# Patient Record
Sex: Female | Born: 1940
Health system: Southern US, Community
[De-identification: ages and names within clinical notes are randomized; demographics above are authoritative.]

## PROBLEM LIST (undated history)

## (undated) DIAGNOSIS — F819 Developmental disorder of scholastic skills, unspecified: Secondary | ICD-10-CM

## (undated) DIAGNOSIS — F801 Expressive language disorder: Secondary | ICD-10-CM

## (undated) DIAGNOSIS — E669 Obesity, unspecified: Secondary | ICD-10-CM

## (undated) DIAGNOSIS — H3582 Retinal ischemia: Secondary | ICD-10-CM

## (undated) DIAGNOSIS — T7840XA Allergy, unspecified, initial encounter: Secondary | ICD-10-CM

## (undated) DIAGNOSIS — F419 Anxiety disorder, unspecified: Secondary | ICD-10-CM

## (undated) DIAGNOSIS — I10 Essential (primary) hypertension: Secondary | ICD-10-CM

## (undated) DIAGNOSIS — D649 Anemia, unspecified: Secondary | ICD-10-CM

## (undated) DIAGNOSIS — E785 Hyperlipidemia, unspecified: Secondary | ICD-10-CM

## (undated) DIAGNOSIS — F79 Unspecified intellectual disabilities: Secondary | ICD-10-CM

## (undated) DIAGNOSIS — K219 Gastro-esophageal reflux disease without esophagitis: Secondary | ICD-10-CM

## (undated) HISTORY — DX: Retinal ischemia: H35.82

## (undated) HISTORY — DX: Allergy, unspecified, initial encounter: T78.40XA

## (undated) HISTORY — PX: CHOLECYSTECTOMY: SHX55

## (undated) HISTORY — DX: Developmental disorder of scholastic skills, unspecified: F81.9

## (undated) HISTORY — DX: Anemia, unspecified: D64.9

## (undated) HISTORY — DX: Essential (primary) hypertension: I10

## (undated) HISTORY — DX: Anxiety disorder, unspecified: F41.9

## (undated) HISTORY — DX: Gastro-esophageal reflux disease without esophagitis: K21.9

## (undated) HISTORY — DX: Unspecified intellectual disabilities: F79

## (undated) HISTORY — DX: Expressive language disorder: F80.1

## (undated) HISTORY — DX: Obesity, unspecified: E66.9

## (undated) HISTORY — DX: Hyperlipidemia, unspecified: E78.5

---

## 2000-09-04 ENCOUNTER — Encounter: Payer: Self-pay | Admitting: Family Medicine

## 2000-09-04 LAB — CONVERTED CEMR LAB

## 2002-06-04 ENCOUNTER — Ambulatory Visit (HOSPITAL_COMMUNITY): Admission: RE | Admit: 2002-06-04 | Discharge: 2002-06-04 | Payer: Self-pay

## 2002-06-04 ENCOUNTER — Encounter (INDEPENDENT_AMBULATORY_CARE_PROVIDER_SITE_OTHER): Payer: Self-pay | Admitting: *Deleted

## 2005-04-26 ENCOUNTER — Ambulatory Visit: Payer: Self-pay | Admitting: Family Medicine

## 2005-05-27 ENCOUNTER — Ambulatory Visit: Payer: Self-pay | Admitting: Family Medicine

## 2005-05-31 ENCOUNTER — Ambulatory Visit: Payer: Self-pay | Admitting: Family Medicine

## 2005-11-30 ENCOUNTER — Ambulatory Visit: Payer: Self-pay | Admitting: Family Medicine

## 2006-02-14 ENCOUNTER — Ambulatory Visit: Payer: Self-pay | Admitting: Family Medicine

## 2006-06-09 ENCOUNTER — Ambulatory Visit: Payer: Self-pay | Admitting: Family Medicine

## 2006-06-13 ENCOUNTER — Encounter: Payer: Self-pay | Admitting: Family Medicine

## 2006-06-13 LAB — CONVERTED CEMR LAB: Microalbumin U total vol: 13.3 mg/L

## 2006-09-14 ENCOUNTER — Ambulatory Visit: Payer: Self-pay | Admitting: Family Medicine

## 2006-12-15 ENCOUNTER — Ambulatory Visit: Payer: Self-pay | Admitting: Family Medicine

## 2006-12-15 LAB — CONVERTED CEMR LAB
ALT: 32 units/L (ref 0–40)
AST: 36 units/L (ref 0–37)
Albumin: 4.5 g/dL (ref 3.5–5.2)
BUN: 16 mg/dL (ref 6–23)
CO2: 28 meq/L (ref 19–32)
Calcium: 9.8 mg/dL (ref 8.4–10.5)
Chloride: 102 meq/L (ref 96–112)
Chol/HDL Ratio, serum: 5.7
Cholesterol: 220 mg/dL (ref 0–200)
Creatinine, Ser: 0.9 mg/dL (ref 0.4–1.2)
GFR calc non Af Amer: 67 mL/min
Glomerular Filtration Rate, Af Am: 81 mL/min/{1.73_m2}
Glucose, Bld: 177 mg/dL — ABNORMAL HIGH (ref 70–99)
HDL: 38.7 mg/dL — ABNORMAL LOW (ref >39.0)
Hgb A1c MFr Bld: 6.5 %
Hgb A1c MFr Bld: 6.5 % — ABNORMAL HIGH (ref 4.6–6.0)
LDL DIRECT: 112.9 mg/dL
Phosphorus Concentration: 3.2 mg/dL (ref 2.3–4.6)
Potassium: 4.1 meq/L (ref 3.5–5.1)
Sodium: 139 meq/L (ref 135–145)
Triglyceride fasting, serum: 298 mg/dL (ref 0–149)
VLDL: 60 mg/dL — ABNORMAL HIGH (ref 0–40)

## 2007-06-05 ENCOUNTER — Encounter: Payer: Self-pay | Admitting: Family Medicine

## 2007-06-05 DIAGNOSIS — E1169 Type 2 diabetes mellitus with other specified complication: Secondary | ICD-10-CM | POA: Insufficient documentation

## 2007-06-05 DIAGNOSIS — J309 Allergic rhinitis, unspecified: Secondary | ICD-10-CM | POA: Insufficient documentation

## 2007-06-05 DIAGNOSIS — H3582 Retinal ischemia: Secondary | ICD-10-CM | POA: Insufficient documentation

## 2007-06-05 DIAGNOSIS — E119 Type 2 diabetes mellitus without complications: Secondary | ICD-10-CM | POA: Insufficient documentation

## 2007-06-05 DIAGNOSIS — F801 Expressive language disorder: Secondary | ICD-10-CM | POA: Insufficient documentation

## 2007-06-05 DIAGNOSIS — F411 Generalized anxiety disorder: Secondary | ICD-10-CM | POA: Insufficient documentation

## 2007-06-05 DIAGNOSIS — I152 Hypertension secondary to endocrine disorders: Secondary | ICD-10-CM | POA: Insufficient documentation

## 2007-06-05 DIAGNOSIS — I10 Essential (primary) hypertension: Secondary | ICD-10-CM

## 2007-06-05 DIAGNOSIS — T783XXA Angioneurotic edema, initial encounter: Secondary | ICD-10-CM | POA: Insufficient documentation

## 2007-06-05 DIAGNOSIS — E7800 Pure hypercholesterolemia, unspecified: Secondary | ICD-10-CM

## 2007-06-15 ENCOUNTER — Ambulatory Visit: Payer: Self-pay | Admitting: Family Medicine

## 2007-06-19 LAB — CONVERTED CEMR LAB
ALT: 21 units/L (ref 0–35)
AST: 25 units/L (ref 0–37)
Albumin: 4.8 g/dL (ref 3.5–5.2)
BUN: 11 mg/dL (ref 6–23)
CO2: 25 meq/L (ref 19–32)
Calcium: 9.9 mg/dL (ref 8.4–10.5)
Chloride: 97 meq/L (ref 96–112)
Cholesterol: 248 mg/dL (ref 0–200)
Creatinine, Ser: 0.9 mg/dL (ref 0.4–1.2)
Creatinine,U: 290.5 mg/dL
Direct LDL: 108.1 mg/dL
GFR calc Af Amer: 81 mL/min
GFR calc non Af Amer: 67 mL/min
Glucose, Bld: 177 mg/dL — ABNORMAL HIGH (ref 70–99)
HDL: 39.8 mg/dL (ref >39.0)
Hgb A1c MFr Bld: 6.3 % — ABNORMAL HIGH (ref 4.6–6.0)
Microalb Creat Ratio: 44.1 mg/g — ABNORMAL HIGH (ref 0.0–30.0)
Microalb, Ur: 12.8 mg/dL — ABNORMAL HIGH (ref 0.0–1.9)
Phosphorus: 3.3 mg/dL (ref 2.3–4.6)
Potassium: 3.9 meq/L (ref 3.5–5.1)
Sodium: 137 meq/L (ref 135–145)
Total CHOL/HDL Ratio: 6.2
Triglycerides: 407 mg/dL (ref 0–149)
VLDL: 81 mg/dL — ABNORMAL HIGH (ref 0–40)

## 2007-09-18 ENCOUNTER — Encounter (INDEPENDENT_AMBULATORY_CARE_PROVIDER_SITE_OTHER): Payer: Self-pay | Admitting: *Deleted

## 2007-09-28 ENCOUNTER — Ambulatory Visit: Payer: Self-pay | Admitting: Family Medicine

## 2007-10-01 LAB — CONVERTED CEMR LAB
Creatinine, Urine: 263.9 mg/dL
Microalb Creat Ratio: 30.4 mg/g — ABNORMAL HIGH (ref 0.0–30.0)
Microalb, Ur: 8.02 mg/dL — ABNORMAL HIGH (ref 0.00–1.89)

## 2007-11-22 ENCOUNTER — Ambulatory Visit: Payer: Self-pay | Admitting: Family Medicine

## 2008-01-22 ENCOUNTER — Encounter: Payer: Self-pay | Admitting: Family Medicine

## 2008-01-23 ENCOUNTER — Encounter: Payer: Self-pay | Admitting: Family Medicine

## 2008-02-07 ENCOUNTER — Ambulatory Visit: Payer: Self-pay | Admitting: Family Medicine

## 2008-02-13 LAB — CONVERTED CEMR LAB
ALT: 22 units/L (ref 0–35)
AST: 23 units/L (ref 0–37)
Albumin: 4.9 g/dL (ref 3.5–5.2)
Alkaline Phosphatase: 43 units/L (ref 39–117)
BUN: 14 mg/dL (ref 6–23)
Bilirubin, Direct: 0.1 mg/dL (ref 0.0–0.3)
CO2: 27 meq/L (ref 19–32)
Calcium: 10 mg/dL (ref 8.4–10.5)
Chloride: 97 meq/L (ref 96–112)
Cholesterol: 283 mg/dL (ref 0–200)
Creatinine, Ser: 0.9 mg/dL (ref 0.4–1.2)
Creatinine,U: 336.1 mg/dL
Direct LDL: 134.8 mg/dL
GFR calc Af Amer: 81 mL/min
GFR calc non Af Amer: 67 mL/min
Glucose, Bld: 159 mg/dL — ABNORMAL HIGH (ref 70–99)
HDL: 38.4 mg/dL — ABNORMAL LOW (ref >39.0)
Hgb A1c MFr Bld: 6.2 % — ABNORMAL HIGH (ref 4.6–6.0)
Microalb Creat Ratio: 37.2 mg/g — ABNORMAL HIGH (ref 0.0–30.0)
Microalb, Ur: 12.5 mg/dL — ABNORMAL HIGH (ref 0.0–1.9)
Phosphorus: 3.6 mg/dL (ref 2.3–4.6)
Potassium: 4.3 meq/L (ref 3.5–5.1)
Sodium: 135 meq/L (ref 135–145)
Total Bilirubin: 0.8 mg/dL (ref 0.3–1.2)
Total CHOL/HDL Ratio: 7.4
Total Protein: 8.3 g/dL (ref 6.0–8.3)
Triglycerides: 391 mg/dL (ref 0–149)
VLDL: 78 mg/dL — ABNORMAL HIGH (ref 0–40)

## 2008-07-09 ENCOUNTER — Ambulatory Visit: Payer: Self-pay | Admitting: Family Medicine

## 2008-07-10 LAB — CONVERTED CEMR LAB
ALT: 16 units/L (ref 0–35)
AST: 20 units/L (ref 0–37)
Albumin: 4.6 g/dL (ref 3.5–5.2)
Alkaline Phosphatase: 45 units/L (ref 39–117)
BUN: 11 mg/dL (ref 6–23)
Basophils Absolute: 0.1 10*3/uL (ref 0.0–0.1)
Basophils Relative: 0.9 % (ref 0.0–3.0)
Bilirubin, Direct: 0.1 mg/dL (ref 0.0–0.3)
CO2: 28 meq/L (ref 19–32)
Calcium: 9.8 mg/dL (ref 8.4–10.5)
Chloride: 98 meq/L (ref 96–112)
Cholesterol: 243 mg/dL (ref 0–200)
Creatinine, Ser: 0.8 mg/dL (ref 0.4–1.2)
Creatinine,U: 223.8 mg/dL
Direct LDL: 104.3 mg/dL
Eosinophils Absolute: 0.1 10*3/uL (ref 0.0–0.7)
Eosinophils Relative: 0.9 % (ref 0.0–5.0)
GFR calc Af Amer: 92 mL/min
GFR calc non Af Amer: 76 mL/min
Glucose, Bld: 160 mg/dL — ABNORMAL HIGH (ref 70–99)
HCT: 38.2 % (ref 36.0–46.0)
HDL: 43.4 mg/dL (ref >39.0)
Hemoglobin: 13.2 g/dL (ref 12.0–15.0)
Hgb A1c MFr Bld: 6.3 % — ABNORMAL HIGH (ref 4.6–6.0)
Lymphocytes Relative: 26.1 % (ref 12.0–46.0)
MCHC: 34.7 g/dL (ref 30.0–36.0)
MCV: 92.4 fL (ref 78.0–100.0)
Microalb Creat Ratio: 292.7 mg/g — ABNORMAL HIGH (ref 0.0–30.0)
Microalb, Ur: 65.5 mg/dL — ABNORMAL HIGH (ref 0.0–1.9)
Monocytes Absolute: 0.6 10*3/uL (ref 0.1–1.0)
Monocytes Relative: 10.6 % (ref 3.0–12.0)
Neutro Abs: 3.5 10*3/uL (ref 1.4–7.7)
Neutrophils Relative %: 61.5 % (ref 43.0–77.0)
Phosphorus: 4.4 mg/dL (ref 2.3–4.6)
Platelets: 256 10*3/uL (ref 150–400)
Potassium: 4 meq/L (ref 3.5–5.1)
RBC: 4.13 M/uL (ref 3.87–5.11)
RDW: 13.7 % (ref 11.5–14.6)
Sodium: 137 meq/L (ref 135–145)
Total Bilirubin: 0.8 mg/dL (ref 0.3–1.2)
Total CHOL/HDL Ratio: 5.6
Total Protein: 8 g/dL (ref 6.0–8.3)
Triglycerides: 388 mg/dL (ref 0–149)
VLDL: 78 mg/dL — ABNORMAL HIGH (ref 0–40)
WBC: 5.8 10*3/uL (ref 4.5–10.5)

## 2008-10-09 ENCOUNTER — Ambulatory Visit: Payer: Self-pay | Admitting: Family Medicine

## 2008-11-03 ENCOUNTER — Telehealth: Payer: Self-pay | Admitting: Family Medicine

## 2008-12-10 ENCOUNTER — Ambulatory Visit: Payer: Self-pay | Admitting: Family Medicine

## 2008-12-11 LAB — CONVERTED CEMR LAB
CO2: 27 meq/L (ref 19–32)
Calcium: 9.7 mg/dL (ref 8.4–10.5)
Chloride: 99 meq/L (ref 96–112)
Creatinine,U: 124.8 mg/dL
GFR calc non Af Amer: 76 mL/min
Glucose, Bld: 180 mg/dL — ABNORMAL HIGH (ref 70–99)
Hgb A1c MFr Bld: 6.4 % — ABNORMAL HIGH (ref 4.6–6.0)
Microalb Creat Ratio: 56.1 mg/g — ABNORMAL HIGH (ref 0.0–30.0)
Microalb, Ur: 7 mg/dL — ABNORMAL HIGH (ref 0.0–1.9)
Potassium: 3.7 meq/L (ref 3.5–5.1)
Sodium: 137 meq/L (ref 135–145)

## 2009-05-06 ENCOUNTER — Ambulatory Visit: Payer: Self-pay | Admitting: Family Medicine

## 2009-06-10 ENCOUNTER — Ambulatory Visit: Payer: Self-pay | Admitting: Family Medicine

## 2009-06-18 ENCOUNTER — Encounter (INDEPENDENT_AMBULATORY_CARE_PROVIDER_SITE_OTHER): Payer: Self-pay | Admitting: *Deleted

## 2009-06-18 LAB — CONVERTED CEMR LAB
BUN: 16 mg/dL (ref 6–23)
CO2: 28 meq/L (ref 19–32)
Creatinine, Ser: 0.8 mg/dL (ref 0.4–1.2)
Hgb A1c MFr Bld: 6.6 % — ABNORMAL HIGH (ref 4.6–6.5)

## 2009-09-08 ENCOUNTER — Ambulatory Visit: Payer: Self-pay | Admitting: Family Medicine

## 2009-09-15 LAB — CONVERTED CEMR LAB
ALT: 20 units/L (ref 0–35)
CO2: 28 meq/L (ref 19–32)
Creatinine, Ser: 0.7 mg/dL (ref 0.4–1.2)
GFR calc non Af Amer: 88.56 mL/min (ref >60)
Glucose, Bld: 188 mg/dL — ABNORMAL HIGH (ref 70–99)
Sodium: 137 meq/L (ref 135–145)
Total CHOL/HDL Ratio: 6
Triglycerides: 375 mg/dL — ABNORMAL HIGH (ref 0.0–149.0)

## 2009-11-18 ENCOUNTER — Ambulatory Visit: Payer: Self-pay | Admitting: Family Medicine

## 2009-11-18 LAB — CONVERTED CEMR LAB
ALT: 16 units/L (ref 0–35)
Cholesterol: 286 mg/dL — ABNORMAL HIGH (ref 0–200)
Glucose, Bld: 159 mg/dL — ABNORMAL HIGH (ref 70–99)
Hgb A1c MFr Bld: 6.4 % (ref 4.6–6.5)
Total CHOL/HDL Ratio: 7

## 2009-11-23 ENCOUNTER — Ambulatory Visit: Payer: Self-pay | Admitting: Family Medicine

## 2009-12-08 ENCOUNTER — Ambulatory Visit: Payer: Self-pay | Admitting: Family Medicine

## 2010-02-08 ENCOUNTER — Ambulatory Visit: Payer: Self-pay | Admitting: Family Medicine

## 2010-05-24 ENCOUNTER — Ambulatory Visit: Payer: Self-pay | Admitting: Family Medicine

## 2010-05-26 LAB — CONVERTED CEMR LAB
ALT: 15 units/L (ref 0–35)
Albumin: 4.8 g/dL (ref 3.5–5.2)
Calcium: 9.5 mg/dL (ref 8.4–10.5)
Chloride: 104 meq/L (ref 96–112)
HDL: 45.7 mg/dL (ref >39.00)
Potassium: 4.6 meq/L (ref 3.5–5.1)
Total CHOL/HDL Ratio: 6
Triglycerides: 436 mg/dL — ABNORMAL HIGH (ref 0.0–149.0)
VLDL: 87.2 mg/dL — ABNORMAL HIGH (ref 0.0–40.0)

## 2010-06-01 ENCOUNTER — Ambulatory Visit: Payer: Self-pay | Admitting: Family Medicine

## 2010-06-01 DIAGNOSIS — L538 Other specified erythematous conditions: Secondary | ICD-10-CM | POA: Insufficient documentation

## 2010-06-22 ENCOUNTER — Telehealth: Payer: Self-pay | Admitting: Family Medicine

## 2010-07-09 LAB — HM DIABETES EYE EXAM: HM Diabetic Eye Exam: NORMAL

## 2010-08-31 ENCOUNTER — Ambulatory Visit: Payer: Self-pay | Admitting: Family Medicine

## 2010-08-31 LAB — CONVERTED CEMR LAB
ALT: 16 units/L (ref 0–35)
AST: 20 units/L (ref 0–37)
Albumin: 4.9 g/dL (ref 3.5–5.2)
BUN: 26 mg/dL — ABNORMAL HIGH (ref 6–23)
Cholesterol: 272 mg/dL — ABNORMAL HIGH (ref 0–200)
GFR calc non Af Amer: 77.94 mL/min (ref >60)
Glucose, Bld: 165 mg/dL — ABNORMAL HIGH (ref 70–99)
Hgb A1c MFr Bld: 6.6 % — ABNORMAL HIGH (ref 4.6–6.5)
Phosphorus: 3.7 mg/dL (ref 2.3–4.6)
Potassium: 4.2 meq/L (ref 3.5–5.1)
Total CHOL/HDL Ratio: 6
Triglycerides: 366 mg/dL — ABNORMAL HIGH (ref 0.0–149.0)
VLDL: 73.2 mg/dL — ABNORMAL HIGH (ref 0.0–40.0)

## 2010-09-08 ENCOUNTER — Ambulatory Visit: Payer: Self-pay | Admitting: Family Medicine

## 2010-09-08 LAB — HM DIABETES FOOT EXAM

## 2011-01-04 NOTE — Progress Notes (Signed)
Summary: needs to change from generic allegra  Phone Note From Pharmacy   Caller: [facility]*   Summary of Call: Pt needs to change from generic allegra to something else that she can get with a prescription.  They are going to stop making the generic allegra, since allegra is now over the counter and pt cant afford the allegra.  Is there something else that she can get with a script that will help her allergies? Initial call taken by: Lowella Petties CMA,  June 22, 2010 12:08 PM  Follow-up for Phone Call        right now claritin and allegra and zyrtec are otc -- she is likely going to have to pay for one of these out of pocket  I can call in nasal spray flonase --? if this will completely handle symptoms or not but can try it  px written on EMR for call in  Follow-up by: Judith Part MD,  June 22, 2010 1:32 PM  Additional Follow-up for Phone Call Additional follow up Details #1::        Amy at [facility]  notified as instructed by telephone. [facility]. Lewanda Rife LPN  June 22, 2010 4:44 PM     New/Updated Medications: FLONASE 50 MCG/ACT SUSP (FLUTICASONE PROPIONATE) 2 sprays in each nostril once daily Prescriptions: FLONASE 50 MCG/ACT SUSP (FLUTICASONE PROPIONATE) 2 sprays in each nostril once daily  #1 mdi x 11   Entered and Authorized by:   Judith Part MD   Signed by:   Lewanda Rife LPN on 09/81/1914   Method used:   Telephoned to ...       [facility]* (retail)       [address]       [place], [place]  [address]       Ph: [phone]       Fax: [phone]   RxID:   [id number]

## 2011-01-04 NOTE — Assessment & Plan Note (Signed)
Summary: F/U AFTER LABS / LFW   Vital Signs:  Patient profile:   70 year old female Height:      64.5 inches Weight:      185 pounds BMI:     31.38 Temp:     97.7 degrees F oral Pulse rate:   64 / minute Pulse rhythm:   regular BP sitting:   116 / 70  (left arm) Cuff size:   large  Vitals Entered By: Lewanda Rife LPN (September 08, 2010 10:25 AM) CC: three month f/u after labs   History of Present Illness: here for f/u of HTN and DM and lipids    wt is down another 5 lb-- is proud of that  more exercise -- working in the garden and walking   AIc is up to 6.6 from 6.5-- overall stable with diet control very careful about the sugar in her diet  sugars are below 120 in the am for the most part  lipids are a bit better with LDL 120 (trig 366 are high but overall imp as well) pt cannot take statins due to lft inc and non tol to zetia diet -- less fats in her diet  she is doing her own cooking and that is better   bp is great 116.70  already given her flu shot  is up to date on vaccines   saw eye doctor - no retinop 2 mo ago -- opthy-- [facility]  no change in her vision   declines mam and colonoscopy   still sad and lonely at times after her husband left ( he is mentally ill)- but overall doing ok    Allergies: 1)  ! Lipitor 2)  ! * Zetia 3)  Sulfa  Past History:  Past Medical History: Last updated: 11/23/2009 Allergic rhinitis Anxiety Diabetes mellitus, type II Hypertension hyperlipidemia - (inc LFT with statin and non tol to zetia) expressive language disorder retinal ischemia-- hx of  obesity GERD mentally disabled/ learning disability (cannot take care of her finances)  opthy  Past Surgical History: Last updated: 06/05/2007 Cholecystectomy Carotid doppler- neg (04/2005) Abd [place]- neg (12/2005)  Family History: Last updated: June 23, 2009 Father: MI, HTN brother died MI at 20  Mother:  Siblings:   Social History: Last updated:  05/06/2009 Marital Status: Married husband has pschizoaffective disorder  family close by/ lot of support  works in garden Children: none Occupation:  non smoker- no second hand exp either  Risk Factors: Smoking Status: quit (06/05/2007)  Review of Systems General:  Denies fatigue, loss of appetite, and malaise. Eyes:  Denies blurring and eye irritation. CV:  Denies chest pain or discomfort, lightheadness, and palpitations. Resp:  Denies cough, shortness of breath, and wheezing. GI:  Denies abdominal pain, change in bowel habits, and indigestion. MS:  Denies joint pain, joint redness, joint swelling, muscle aches, and cramps. Derm:  Denies itching, lesion(s), poor wound healing, and rash. Neuro:  Denies numbness and tingling. Psych:  sad at times . Endo:  Denies cold intolerance, excessive thirst, excessive urination, and heat intolerance. Heme:  Denies abnormal bruising and bleeding.  Physical Exam  General:  overweight but generally well appearing  Head:  normocephalic, atraumatic, and no abnormalities observed.   Eyes:  vision grossly intact, pupils equal, pupils round, and pupils reactive to light.  no conjunctival pallor, injection or icterus  Ears:  R ear normal and L ear normal.   Nose:  no nasal discharge.   Mouth:  pharynx pink  and moist.   Neck:  supple with full rom and no masses or thyromegally, no JVD or carotid bruit  Chest Wall:  No deformities, masses, or tenderness noted. Lungs:  Normal respiratory effort, chest expands symmetrically. Lungs are clear to auscultation, no crackles or wheezes. Heart:  Normal rate and regular rhythm. S1 and S2 normal without gallop, murmur, click, rub or other extra sounds. Abdomen:  Bowel sounds positive,abdomen soft and non-tender without masses, organomegaly or hernias noted. no renal bruits  Msk:  No deformity or scoliosis noted of thoracic or lumbar spine.  no acute joint changes  Pulses:  R and L  carotid,radial,femoral,dorsalis pedis and posterior tibial pulses are full and equal bilaterally Extremities:  No clubbing, cyanosis, edema, or deformity noted with normal full range of motion of all joints.   Neurologic:  sensation intact to light touch, gait normal, and DTRs symmetrical and normal.   Skin:  Intact without suspicious lesions or rashes Cervical Nodes:  No lymphadenopathy noted Psych:  normal affect, talkative and pleasant   Diabetes Management Exam:    Foot Exam (with socks and/or shoes not present):       Sensory-Pinprick/Light touch:          Left medial foot (L-4): normal          Left dorsal foot (L-5): normal          Left lateral foot (S-1): normal          Right medial foot (L-4): normal          Right dorsal foot (L-5): normal          Right lateral foot (S-1): normal       Sensory-Monofilament:          Left foot: normal          Right foot: normal       Inspection:          Left foot: normal          Right foot: normal       Nails:          Left foot: normal          Right foot: normal    Eye Exam:       Eye Exam done elsewhere          Date: 07/09/2010          Results: normal          Done by: [facility]    Impression & Recommendations:  Problem # 1:  Hx of HYPERCHOLESTEROLEMIA (ICD-272.0) Assessment Improved  this is improved with better diet  non tol statin or zetia -- but watching diet for sat fats (which we disc) rev lab in detail  re check 6 mo and f/u  Labs Reviewed: SGOT: 20 (08/31/2010)   SGPT: 16 (08/31/2010)   HDL:47.90 (08/31/2010), 45.70 (05/24/2010)  LDL:DEL (07/09/2008), DEL (02/07/2008)  Chol:272 (08/31/2010), 270 (05/24/2010)  Trig:366.0 (08/31/2010), 436.0 (05/24/2010)  Problem # 2:  HYPERTENSION (ICD-401.9) Assessment: Unchanged  bp continues to be great with current meds lab reviewed f/u 6 mo  Her updated medication list for this problem includes:    Accupril 40 Mg Tabs (Quinapril hcl) .Marland Kitchen... Take 11/2 by mouth once  daily    Bisoprolol-hydrochlorothiazide 10-6.25 Mg Tabs (Bisoprolol-hydrochlorothiazide) .Marland Kitchen... Take two by mouth daily    Norvasc 10 Mg Tabs (Amlodipine besylate) .Marland Kitchen... Take one by mouth daily  BP today: 116/70 Prior BP: 126/74 (06/01/2010)  Labs Reviewed:  K+: 4.2 (08/31/2010) Creat: : 0.8 (08/31/2010)   Chol: 272 (08/31/2010)   HDL: 47.90 (08/31/2010)   LDL: DEL (07/09/2008)   TG: 366.0 (08/31/2010)  Problem # 3:  DIABETES MELLITUS, TYPE II (ICD-250.00) Assessment: Unchanged  this has very slt bumped  again disc imp of low glycemic diet and further wt loss and exercise she is compliant with sugar checks  opthy up to date and good foot care lab and f/u 6 mo  Her updated medication list for this problem includes:    Accupril 40 Mg Tabs (Quinapril hcl) .Marland Kitchen... Take 11/2 by mouth once daily  Labs Reviewed: Creat: 0.8 (08/31/2010)   Microalbumin: 13.3 (06/13/2006)  Last Eye Exam: normal (05/05/2009) Reviewed HgBA1c results: 6.6 (08/31/2010)  6.4 (05/24/2010)  Complete Medication List: 1)  Accupril 40 Mg Tabs (Quinapril hcl) .... Take 11/2 by mouth once daily 2)  Bisoprolol-hydrochlorothiazide 10-6.25 Mg Tabs (Bisoprolol-hydrochlorothiazide) .... Take two by mouth daily 3)  Norvasc 10 Mg Tabs (Amlodipine besylate) .... Take one by mouth daily 4)  Ascensia Breeze 2 Disk (Glucose blood) .... Check bs as directed 5)  Omeprazole 20 Mg Tbec (Omeprazole) .Marland Kitchen.. 1 by mouth q am 30 min before breakfast 6)  Zoloft 50 Mg Tabs (Sertraline hcl) .... Take 1 by mouth once daily in evening 7)  Lotrisone 1-0.05 % Crea (Clotrimazole-betamethasone) .... Apply to affected area once daily as needed 8)  Flonase 50 Mcg/act Susp (Fluticasone propionate) .... 2 sprays in each nostril once daily 9)  Tylenol Extra Strength 500 Mg Tabs (Acetaminophen) .... Otc as directed.  Other Orders: Flu Vaccine 59yrs + MEDICARE PATIENTS (X9147) Administration Flu vaccine - MCR (W2956)  Patient Instructions: 1)  labs are  stable 2)  keep working on healthy diet and exercise and weight loss  3)  no change in medicine 4)  schedule fasting labs in 6 months and then follow up  5)  lipid/ast /alt / renal / AIC / renal 250.0, 272   Current Allergies (reviewed today): ! LIPITOR ! * ZETIA SULFA    Flu Vaccine Consent Questions     Do you have a history of severe allergic reactions to this vaccine? no    Any prior history of allergic reactions to egg and/or gelatin? no    Do you have a sensitivity to the preservative Thimersol? no    Do you have a past history of Guillan-Barre Syndrome? no    Do you currently have an acute febrile illness? no    Have you ever had a severe reaction to latex? no    Vaccine information given and explained to patient? yes    Are you currently pregnant? no    Lot Number:[id number]   Exp Date:06/04/2011   Site Given  Left Deltoid IMedflu Lewanda Rife LPN  September 08, 2010 10:31 AM

## 2011-01-04 NOTE — Assessment & Plan Note (Signed)
Summary: 30 MIN F/U AFTER LABS / LFW R/S 05/27/10   Vital Signs:  Patient profile:   70 year old female Height:      64.5 inches Weight:      190.50 pounds BMI:     32.31 Temp:     97.7 degrees F oral Pulse rate:   60 / minute Pulse rhythm:   regular BP sitting:   126 / 74  (left arm) Cuff size:   large  Vitals Entered By: Lewanda Rife LPN (June 01, 2010 8:31 AM) CC: six month f/u after labs   History of Present Illness: here for f/u of HTN and DM and lipids  wt is up 3 lb  just went to the beach -- last week  not eating her usual diet -- some more sugar and fried foods  is walking regularly -- that is good -- getting strength back   HTN in good control 126/74  AIC up a bit at 6.4- but still good control opthy--was supposed to go last mo - but had to cancel -- re sched for july 22 no change in her vision  nl microalb  lipids -- trig continue to climb at 436 LDL 130s into statins and zetia with inc lfts   up to date on imms   ? yeast infx on breasts - needs a cream       Allergies: 1)  ! Lipitor 2)  ! * Zetia 3)  Sulfa  Past History:  Past Medical History: Last updated: 11/23/2009 Allergic rhinitis Anxiety Diabetes mellitus, type II Hypertension hyperlipidemia - (inc LFT with statin and non tol to zetia) expressive language disorder retinal ischemia-- hx of  obesity GERD mentally disabled/ learning disability (cannot take care of her finances)  opthy  Past Surgical History: Last updated: 06/05/2007 Cholecystectomy Carotid doppler- neg (04/2005) Abd [place]- neg (12/2005)  Family History: Last updated: 06/27/2009 Father: MI, HTN brother died MI at 51  Mother:  Siblings:   Social History: Last updated: 05/06/2009 Marital Status: Married husband has pschizoaffective disorder  family close by/ lot of support  works in garden Children: none Occupation:  non smoker- no second hand exp either  Risk Factors: Smoking Status: quit  (06/05/2007)  Review of Systems General:  Denies fatigue, loss of appetite, and malaise. Eyes:  Denies blurring and eye irritation. CV:  Denies chest pain or discomfort, near fainting, palpitations, shortness of breath with exertion, and swelling of feet. Resp:  Denies cough, shortness of breath, and wheezing. GI:  Denies abdominal pain and change in bowel habits. GU:  Denies dysuria. MS:  Denies joint redness, joint swelling, and muscle aches. Derm:  Denies itching, lesion(s), poor wound healing, and rash. Neuro:  Denies numbness and tingling. Psych:  Denies anxiety and depression. Endo:  Denies excessive thirst and excessive urination. Heme:  Denies abnormal bruising and bleeding.  Physical Exam  General:  overweight but generally well appearing  Head:  normocephalic, atraumatic, and no abnormalities observed.   Eyes:  vision grossly intact, pupils equal, pupils round, and pupils reactive to light.   Ears:  R ear normal and L ear normal.   Mouth:  pharynx pink and moist.   baseline speech impediment  Neck:  supple with full rom and no masses or thyromegally, no JVD or carotid bruit  Chest Wall:  No deformities, masses, or tenderness noted. Lungs:  Normal respiratory effort, chest expands symmetrically. Lungs are clear to auscultation, no crackles or wheezes. Heart:  Normal rate and  regular rhythm. S1 and S2 normal without gallop, murmur, click, rub or other extra sounds. Abdomen:  soft and non-tender.  no renal bruits  Msk:  No deformity or scoliosis noted of thoracic or lumbar spine.   Pulses:  R and L carotid,radial,femoral,dorsalis pedis and posterior tibial pulses are full and equal bilaterally Extremities:  No clubbing, cyanosis, edema, or deformity noted with normal full range of motion of all joints.   Neurologic:  sensation intact to light touch, gait normal, and DTRs symmetrical and normal.   Skin:  some erythema in patches under breasts resembling intertrigo  tanned    Cervical Nodes:  No lymphadenopathy noted Inguinal Nodes:  No significant adenopathy Psych:  nl affect- less depressed today  Diabetes Management Exam:    Foot Exam (with socks and/or shoes not present):       Sensory-Pinprick/Light touch:          Left medial foot (L-4): normal          Left dorsal foot (L-5): normal          Left lateral foot (S-1): normal          Right medial foot (L-4): normal          Right dorsal foot (L-5): normal          Right lateral foot (S-1): normal       Sensory-Monofilament:          Left foot: normal          Right foot: normal       Inspection:          Left foot: normal          Right foot: normal       Nails:          Left foot: thickened          Right foot: normal   Impression & Recommendations:  Problem # 1:  Hx of HYPERCHOLESTEROLEMIA (ICD-272.0) Assessment Deteriorated  lipids are up (trig ) today- could be from recent poor eating on vacation and inc sugar rev low fat diet in detail will work on that and wt loss in past inc lft with statins- ? if could consider trig agent if not imp in 3 mo   Labs Reviewed: SGOT: 18 (05/24/2010)   SGPT: 15 (05/24/2010)   HDL:45.70 (05/24/2010), 43.40 (11/18/2009)  LDL:DEL (07/09/2008), DEL (02/07/2008)  Chol:270 (05/24/2010), 286 (11/18/2009)  Trig:436.0 (05/24/2010), 350.0 (11/18/2009)  Orders: Prescription Created Electronically [phone])  Problem # 2:  Hx of ISCHEMIA, RETINAL (ICD-362.84) Assessment: Comment Only no vision change for f/u next mo   Problem # 3:  HYPERTENSION (ICD-401.9) Assessment: Unchanged  this is well controlled meds refilled enc walking and wt loss and low sodium diet  Her updated medication list for this problem includes:    Accupril 40 Mg Tabs (Quinapril hcl) .Marland Kitchen... Take 11/2 by mouth once daily    Bisoprolol-hydrochlorothiazide 10-6.25 Mg Tabs (Bisoprolol-hydrochlorothiazide) .Marland Kitchen... Take two by mouth daily    Norvasc 10 Mg Tabs (Amlodipine besylate) .Marland Kitchen... Take  one by mouth daily  BP today: 126/74 Prior BP: 128/80 (02/08/2010)  Labs Reviewed: K+: 4.6 (05/24/2010) Creat: : 0.8 (05/24/2010)   Chol: 270 (05/24/2010)   HDL: 45.70 (05/24/2010)   LDL: DEL (07/09/2008)   TG: 436.0 (05/24/2010)  Orders: Prescription Created Electronically [phone])  Problem # 4:  DIABETES MELLITUS, TYPE II (ICD-250.00) Assessment: Deteriorated  AIC up a bit but still well controlled opthy next mo  nl  microalb disc healthy diet (low simple sugar/ choose complex carbs/ low sat fat) diet and exercise in detail  lab and f/u 3 mo  Her updated medication list for this problem includes:    Accupril 40 Mg Tabs (Quinapril hcl) .Marland Kitchen... Take 11/2 by mouth once daily  Labs Reviewed: Creat: 0.8 (05/24/2010)   Microalbumin: 13.3 (06/13/2006)  Last Eye Exam: normal (05/05/2009) Reviewed HgBA1c results: 6.4 (05/24/2010)  6.1 (02/08/2010)  Orders: Prescription Created Electronically [phone])  Problem # 5:  INTERTRIGO, CANDIDAL (ICD-695.89) Assessment: Deteriorated  refil med for tinea -- use once daily as needed  disc imp of keeping area clean and dry as much as possible   Orders: Prescription Created Electronically [phone])  Complete Medication List: 1)  Accupril 40 Mg Tabs (Quinapril hcl) .... Take 11/2 by mouth once daily 2)  Bisoprolol-hydrochlorothiazide 10-6.25 Mg Tabs (Bisoprolol-hydrochlorothiazide) .... Take two by mouth daily 3)  Norvasc 10 Mg Tabs (Amlodipine besylate) .... Take one by mouth daily 4)  Allegra 180 Mg Tabs (Fexofenadine hcl) .... Take one by mouth daily for allergies 5)  Ascensia Breeze 2 Disk (Glucose blood) .... Check bs as directed 6)  Omeprazole 20 Mg Tbec (Omeprazole) .Marland Kitchen.. 1 by mouth q am 30 min before breakfast 7)  Zoloft 50 Mg Tabs (Sertraline hcl) .... Take 1 by mouth once daily in evening 8)  Lotrisone 1-0.05 % Crea (Clotrimazole-betamethasone) .... Apply to affected area once daily as needed  Patient Instructions: 1)  really stick  to a low sugar and low fat diet  2)  keep walking every day  3)  keep working on weight loss  4)  schedule fasting labs in 3 months lipid/ast/alt/AIC / renal 272, 250.0 and then follow up Prescriptions: ZOLOFT 50 MG TABS (SERTRALINE HCL) take 1 by mouth once daily in evening  #30 x 11   Entered and Authorized by:   Judith Part MD   Signed by:   Judith Part MD on 06/01/2010   Method used:   Electronically to        [facility]* (retail)       [address]       [place], [place]  [address]       Ph: [phone]       Fax: [phone]   RxID:   [id number] OMEPRAZOLE 20 MG  TBEC (OMEPRAZOLE) 1 by mouth q am 30 min before breakfast  #90 x 3   Entered and Authorized by:   Judith Part MD   Signed by:   Judith Part MD on 06/01/2010   Method used:   Electronically to        [facility]* (retail)       [address]       [place], [place]  [address]       Ph: [phone]       Fax: [phone]   RxID:   [id number] NORVASC 10 MG  TABS (AMLODIPINE BESYLATE) take one by mouth daily  #30 x 11   Entered and Authorized by:   Judith Part MD   Signed by:   Judith Part MD on 06/01/2010   Method used:   Electronically to        [facility]* (retail)       [address]       [place], [place]  [address]       Ph: [phone]       Fax: [phone]   RxID:   [id number] BISOPROLOL-HYDROCHLOROTHIAZIDE 10-6.25  MG  TABS (BISOPROLOL-HYDROCHLOROTHIAZIDE) take two by mouth daily  #60 x 11   Entered and Authorized by:   Judith Part MD   Signed by:   Judith Part MD on 06/01/2010   Method used:   Electronically to        [facility]* (retail)       [address]       [place], [place]  [address]       Ph: [phone]       Fax: [phone]   RxID:   [id number] LOTRISONE 1-0.05 % CREA (CLOTRIMAZOLE-BETAMETHASONE) apply to affected area once daily as needed  #1 small x 1   Entered and Authorized by:   Judith Part MD   Signed by:    Judith Part MD on 06/01/2010   Method used:   Electronically to        [facility]* (retail)       [address]       [place], [place]  [address]       Ph: [phone]       Fax: [phone]   RxID:   [id number]   Current Allergies (reviewed today): ! LIPITOR ! * ZETIA SULFA

## 2011-01-04 NOTE — Assessment & Plan Note (Signed)
Summary: PNEUMONIA VACCINE / LFW   Nurse Visit   Allergies: 1)  ! Lipitor 2)  ! * Zetia 3)  Sulfa  Immunizations Administered:  Pneumonia Vaccine:    Vaccine Type: Pneumovax    Site: right deltoid    Mfr: Merck    Dose: 0.5 ml    Route: IM    Given by: Lowella Petties CMA    Exp. Date: 12/31/2010    Lot #: [id number]    VIS given: 07/02/96 version given December 08, 2009.  Orders Added: 1)  Pneumococcal Vaccine [90732] 2)  Admin 1st Vaccine [04540]

## 2011-01-04 NOTE — Assessment & Plan Note (Signed)
Summary: RETURN OFFICE VISIT, [facility]   Vital Signs:  Patient profile:   70 year old female Height:      64.5 inches Weight:      187.50 pounds BMI:     31.80 Temp:     97.8 degrees F oral Pulse rate:   60 / minute Pulse rhythm:   regular BP sitting:   128 / 80  (left arm) Cuff size:   large  Vitals Entered By: Lewanda Rife LPN (February 08, 453 9:47 AM)  History of Present Illness: here for f/u of DM and HTN and lipids feeling about the same   HTN has been fairly controlled - no problems / headaches or further vision changes   lipids last LDL 140s- about the best she can get with diet is intolerant of zetia and all statins   DM- due for AIC , last 6.4 diet- doing good and really staying away from the sweets and drinks   exercise - goes some in the house when too cold outside   got her pneumovax is up to date opthy exam  tries to take care of feet with lotion  Allergies: 1)  ! Lipitor 2)  ! * Zetia 3)  Sulfa  Past History:  Past Medical History: Last updated: 11/23/2009 Allergic rhinitis Anxiety Diabetes mellitus, type II Hypertension hyperlipidemia - (inc LFT with statin and non tol to zetia) expressive language disorder retinal ischemia-- hx of  obesity GERD mentally disabled/ learning disability (cannot take care of her finances)  opthy  Past Surgical History: Last updated: 06/05/2007 Cholecystectomy Carotid doppler- neg (04/2005) Abd [place]- neg (12/2005)  Family History: Last updated: 06-18-2009 Father: MI, HTN brother died MI at 58  Mother:  Siblings:   Social History: Last updated: 05/06/2009 Marital Status: Married husband has pschizoaffective disorder  family close by/ lot of support  works in garden Children: none Occupation:  non smoker- no second hand exp either  Risk Factors: Smoking Status: quit (06/05/2007)  Review of Systems General:  Denies fatigue, fever, loss of appetite, and malaise. Eyes:  Denies blurring and eye  pain. ENT:  Complains of postnasal drainage; may be getting another cold- just started with post nasal drip and scratchy throat . CV:  Denies chest pain or discomfort, lightheadness, palpitations, and shortness of breath with exertion. Resp:  Denies cough, shortness of breath, and wheezing. GI:  Denies abdominal pain, change in bowel habits, indigestion, and nausea. GU:  Denies urinary frequency. MS:  Denies muscle aches. Derm:  Denies itching, lesion(s), poor wound healing, and rash. Neuro:  Denies numbness and tingling. Psych:  mood is a little better - is living alone and adjusting to idea of divorce. Endo:  Denies cold intolerance, excessive thirst, excessive urination, and heat intolerance. Heme:  Denies abnormal bruising and bleeding.  Physical Exam  General:  overweight but generally well appearing  Head:  normocephalic, atraumatic, and no abnormalities observed.   Eyes:  vision grossly intact, pupils equal, pupils round, and pupils reactive to light.   Mouth:  pharynx pink and moist.   Neck:  supple with full rom and no masses or thyromegally, no JVD or carotid bruit  Lungs:  Normal respiratory effort, chest expands symmetrically. Lungs are clear to auscultation, no crackles or wheezes. Heart:  Normal rate and regular rhythm. S1 and S2 normal without gallop, murmur, click, rub or other extra sounds. Abdomen:  soft and non-tender.  no renal bruits  Msk:  No deformity or scoliosis noted of thoracic  or lumbar spine.   Pulses:  R and L carotid,radial,femoral,dorsalis pedis and posterior tibial pulses are full and equal bilaterally Extremities:  No clubbing, cyanosis, edema, or deformity noted with normal full range of motion of all joints.   Neurologic:  sensation intact to light touch, gait normal, and DTRs symmetrical and normal.   Skin:  dry skin on feet no rash Cervical Nodes:  No lymphadenopathy noted Inguinal Nodes:  No significant adenopathy Psych:  cheerful today baseline  speech impediment  Diabetes Management Exam:    Foot Exam (with socks and/or shoes not present):       Sensory-Pinprick/Light touch:          Left medial foot (L-4): normal          Left dorsal foot (L-5): normal          Left lateral foot (S-1): normal          Right medial foot (L-4): normal          Right dorsal foot (L-5): normal          Right lateral foot (S-1): normal       Sensory-Monofilament:          Left foot: normal          Right foot: normal       Sensory-other: feet are very dry- no skin inturruption       Inspection:          Left foot: normal          Right foot: normal       Nails:          Left foot: normal          Right foot: normal   Impression & Recommendations:  Problem # 1:  HYPERTENSION (ICD-401.9) Assessment Unchanged  continues to be at goal with current meds enc more exercise as tolerated and to work harder on weight loss  plan to f/u for check up in 60mo  Her updated medication list for this problem includes:    Accupril 40 Mg Tabs (Quinapril hcl) .Marland Kitchen... Take 11/2 by mouth once daily    Bisoprolol-hydrochlorothiazide 10-6.25 Mg Tabs (Bisoprolol-hydrochlorothiazide) .Marland Kitchen... Take two by mouth daily    Norvasc 10 Mg Tabs (Amlodipine besylate) .Marland Kitchen... Take one by mouth daily  BP today: 128/80 Prior BP: 120/80 (11/23/2009)  Labs Reviewed: K+: 4.0 (09/08/2009) Creat: : 0.7 (09/08/2009)   Chol: 286 (11/18/2009)   HDL: 43.40 (11/18/2009)   LDL: DEL (07/09/2008)   TG: 350.0 (11/18/2009)  Problem # 2:  Hx of HYPERCHOLESTEROLEMIA (ICD-272.0) Assessment: Unchanged  unable to tol meds for this  rev low sat fat diet  LDL remains in 140s which is to high- pt aware goal is under 70 not checking this today f/u 3 mo   Labs Reviewed: SGOT: 22 (11/18/2009)   SGPT: 16 (11/18/2009)   HDL:43.40 (11/18/2009), 46.40 (09/08/2009)  LDL:DEL (07/09/2008), DEL (02/07/2008)  Chol:286 (11/18/2009), 284 (09/08/2009)  Trig:350.0 (11/18/2009), 375.0 (09/08/2009)  Problem  # 3:  DIABETES MELLITUS, TYPE II (ICD-250.00) Assessment: Unchanged overall sounds stable with diet control disc healthy diet (low simple sugar/ choose complex carbs/ low sat fat) diet and exercise in detail  enc more wt loss lab today f/u check up 3 mo  opthy up to date  feet dry but no sign of neuropathy Her updated medication list for this problem includes:    Accupril 40 Mg Tabs (Quinapril hcl) .Marland Kitchen... Take 11/2 by mouth once daily  Orders: Venipuncture (16109) TLB-A1C / Hgb A1C (Glycohemoglobin) (83036-A1C) TLB-Microalbumin/Creat Ratio, Urine (82043-MALB)  Problem # 4:  ALLERGIC RHINITIS (ICD-477.9) Assessment: Deteriorated  refil allegra that works well for her  may help new cold symptoms as well  update if not imp Her updated medication list for this problem includes:    Allegra 180 Mg Tabs (Fexofenadine hcl) .Marland Kitchen... Take one by mouth daily for allergies  Orders: Prescription Created Electronically [phone])  Complete Medication List: 1)  Accupril 40 Mg Tabs (Quinapril hcl) .... Take 11/2 by mouth once daily 2)  Bisoprolol-hydrochlorothiazide 10-6.25 Mg Tabs (Bisoprolol-hydrochlorothiazide) .... Take two by mouth daily 3)  Norvasc 10 Mg Tabs (Amlodipine besylate) .... Take one by mouth daily 4)  Allegra 180 Mg Tabs (Fexofenadine hcl) .... Take one by mouth daily for allergies 5)  Ascensia Breeze 2 Disk (Glucose blood) .... Check bs as directed 6)  Omeprazole 20 Mg Tbec (Omeprazole) .Marland Kitchen.. 1 by mouth q am 30 min before breakfast 7)  Zoloft 50 Mg Tabs (Sertraline hcl) .... Take 1 by mouth once daily in evening  Patient Instructions: 1)  I sent px for allegra for allergies to [facility] pharmacy  2)  keep working on [employer] and exercise and weight loss  3)  no change in medicines  4)  labs for diabetes today  5)  follow up with me in about 3 months for 30 minute check up  Prescriptions: ALLEGRA 180 MG  TABS (FEXOFENADINE HCL) take one by mouth daily for allergies  #90 x 3    Entered and Authorized by:   Judith Part MD   Signed by:   Judith Part MD on 02/08/2010   Method used:   Electronically to        [facility]* (retail)       [address]       [place], [place]  [address]       Ph: [phone]       Fax: [phone]   RxID:   [id number]   Current Allergies (reviewed today): ! LIPITOR ! * ZETIA SULFA

## 2011-01-05 ENCOUNTER — Encounter: Payer: Self-pay | Admitting: Family Medicine

## 2011-01-12 NOTE — Miscellaneous (Signed)
Summary: Orders Update  Clinical Lists Changes  Orders: Added new Test order of T-Basic Metabolic Panel [phone]) - Signed Added new Test order of T- Hemoglobin A1C ([id number]) - Signed  Appended Document: Orders Update test cancelled, entered in wrong chart

## 2011-01-26 ENCOUNTER — Encounter: Payer: Self-pay | Admitting: Family Medicine

## 2011-03-10 ENCOUNTER — Other Ambulatory Visit: Payer: Self-pay | Admitting: Family Medicine

## 2011-03-10 DIAGNOSIS — E7800 Pure hypercholesterolemia, unspecified: Secondary | ICD-10-CM

## 2011-03-14 ENCOUNTER — Other Ambulatory Visit (INDEPENDENT_AMBULATORY_CARE_PROVIDER_SITE_OTHER): Payer: Medicare Other | Admitting: Family Medicine

## 2011-03-14 DIAGNOSIS — E7800 Pure hypercholesterolemia, unspecified: Secondary | ICD-10-CM

## 2011-03-14 DIAGNOSIS — E785 Hyperlipidemia, unspecified: Secondary | ICD-10-CM

## 2011-03-14 DIAGNOSIS — E119 Type 2 diabetes mellitus without complications: Secondary | ICD-10-CM

## 2011-03-14 LAB — RENAL FUNCTION PANEL
Albumin: 4.5 g/dL (ref 3.5–5.2)
BUN: 22 mg/dL (ref 6–23)
CO2: 26 mEq/L (ref 19–32)
Calcium: 9.2 mg/dL (ref 8.4–10.5)
Creatinine, Ser: 0.7 mg/dL (ref 0.4–1.2)
Glucose, Bld: 170 mg/dL — ABNORMAL HIGH (ref 70–99)

## 2011-03-14 LAB — LIPID PANEL
Cholesterol: 304 mg/dL — ABNORMAL HIGH (ref 0–200)
Total CHOL/HDL Ratio: 7
Triglycerides: 498 mg/dL — ABNORMAL HIGH (ref 0.0–149.0)
VLDL: 99.6 mg/dL — ABNORMAL HIGH (ref 0.0–40.0)

## 2011-03-14 LAB — AST: AST: 21 U/L (ref 0–37)

## 2011-03-16 ENCOUNTER — Ambulatory Visit: Payer: Self-pay | Admitting: Family Medicine

## 2011-03-23 ENCOUNTER — Ambulatory Visit (INDEPENDENT_AMBULATORY_CARE_PROVIDER_SITE_OTHER): Payer: Medicare Other | Admitting: Family Medicine

## 2011-03-23 ENCOUNTER — Encounter: Payer: Self-pay | Admitting: Family Medicine

## 2011-03-23 DIAGNOSIS — E119 Type 2 diabetes mellitus without complications: Secondary | ICD-10-CM

## 2011-03-23 DIAGNOSIS — R21 Rash and other nonspecific skin eruption: Secondary | ICD-10-CM

## 2011-03-23 DIAGNOSIS — I10 Essential (primary) hypertension: Secondary | ICD-10-CM

## 2011-03-23 DIAGNOSIS — E7800 Pure hypercholesterolemia, unspecified: Secondary | ICD-10-CM

## 2011-03-23 MED ORDER — BISOPROLOL-HYDROCHLOROTHIAZIDE 10-6.25 MG PO TABS: 1.0000 | ORAL_TABLET | Freq: Two times a day (BID) | ORAL | Status: DC

## 2011-03-23 MED ORDER — OMEPRAZOLE 20 MG PO CPDR: 20.0000 mg | DELAYED_RELEASE_CAPSULE | Freq: Every day | ORAL | Status: DC

## 2011-03-23 MED ORDER — METFORMIN HCL 500 MG PO TABS: 500.0000 mg | ORAL_TABLET | Freq: Two times a day (BID) | ORAL | Status: DC

## 2011-03-23 MED ORDER — FLUTICASONE PROPIONATE 50 MCG/ACT NA SUSP: 2.0000 | Freq: Every day | NASAL | Status: DC

## 2011-03-23 MED ORDER — SERTRALINE HCL 50 MG PO TABS: 50.0000 mg | ORAL_TABLET | Freq: Every day | ORAL | Status: DC

## 2011-03-23 MED ORDER — TRIAMCINOLONE ACETONIDE 0.025 % EX CREA: TOPICAL_CREAM | Freq: Every day | CUTANEOUS | Status: AC

## 2011-03-23 MED ORDER — QUINAPRIL HCL 40 MG PO TABS: 40.0000 mg | ORAL_TABLET | Freq: Every day | ORAL | Status: DC

## 2011-03-23 MED ORDER — AMLODIPINE BESYLATE 10 MG PO TABS: 10.0000 mg | ORAL_TABLET | Freq: Every day | ORAL | Status: DC

## 2011-03-23 NOTE — Patient Instructions (Signed)
Try triamcinolone cream for rash on face once daily for about 2 weeks Call and let me know if it is not getting better Avoid perfumes and makeups Use dove or ivory soap  Start metformin (diabetes medicine) 500 mg twice daily with breakfast and dinner  Check sugar in am and 2 hours after dinner and keep a log  Follow up in 3 months after labs  If any problems with the medicine - let me know

## 2011-03-23 NOTE — Assessment & Plan Note (Signed)
This is worse Disc diet and need for wt loss in detail opthy up to date On ace Will add bid metformin and f/u 3 mo Check sugar bid- update if side eff

## 2011-03-23 NOTE — Assessment & Plan Note (Signed)
Good control on norvasc/ ziac and accupril No change Disc need for wt loss

## 2011-03-23 NOTE — Assessment & Plan Note (Signed)
Red fading rash on cheeks/ bridge of nose with some peeling Looks like all rxn of some type Given low potency triamcinolone cream to try for 2 wk Update if not imp

## 2011-03-23 NOTE — Progress Notes (Signed)
Subjective:    Patient ID: Barbara [name], female    DOB: 06-04-1941, 70 y.o.   MRN: [id number]  HPI Here for f/u of HTN and lipids and DM Is feeling allright overall   Has a knot on her eye -- about a month- comes and goes- thought it was poison oak  Also rash on face that is improving A little red- was never blistered  Some peeling - cheeks and eyes Itches a little bit   Wt is up 5 lb with high bmi-- don't know what caused this  Walks every day 5-10 minute --thinks she can inc this   DM is worse with A1c of 6.8 up from 6.6 Is diet controlled Sugars - at home -- in ams is usually 120s to 140s  Fasting here was 170 Ate some sugar over her birthday   Lipids up - trig over 400 and LDL is 142 from 120s Lab Results  Component Value Date   CHOL 304* 03/14/2011   CHOL 272* 08/31/2010   CHOL 270* 05/24/2010   Lab Results  Component Value Date   HDL 46.30 03/14/2011   HDL 47.90 08/31/2010   HDL 65.78 05/24/2010   No results found for this basename: LDLCALC   Lab Results  Component Value Date   TRIG 498.0 Triglyceride is over 400; calculations on Lipids are invalid.* 03/14/2011   TRIG 366.0* 08/31/2010   TRIG 436.0* 05/24/2010   Lab Results  Component Value Date   CHOLHDL 7 03/14/2011   CHOLHDL 6 08/31/2010   CHOLHDL 6 05/24/2010   Diet - eats some eggs and sausage more   HTN is in good control 122/74 On ace and norvasc and ziac No headaches No edema   Past Medical History  Diagnosis Date  . Allergy   . Anxiety   . Diabetes mellitus   . Hypertension   . Hyperlipidemia   . GERD (gastroesophageal reflux disease)   . Expressive language disorder   . Retinal ischemia     history of  . Obesity   . Mentally disabled   . Learning disability    Past Surgical History  Procedure Date  . Cholecystectomy     reports that she has never smoked. She does not have any smokeless tobacco history on file. Her alcohol and drug histories not on file. family history includes Heart  disease in her brother and father and Hypertension in her father. Allergies  Allergen Reactions  . Atorvastatin     REACTION: increased liver fxn tests  . Ezetimibe     REACTION: stomach upset/malaise  . Sulfonamide Derivatives     REACTION: rash         Review of Systems  Constitutional: Positive for unexpected weight change. Negative for appetite change and fatigue.  Eyes: Negative for pain and visual disturbance.  Respiratory: Negative for shortness of breath and wheezing.   Cardiovascular: Negative.   Gastrointestinal: Negative for nausea, abdominal pain, diarrhea and constipation.  Genitourinary: Negative for urgency and frequency.  Musculoskeletal: Negative for myalgias and arthralgias.  Skin: Positive for rash. Negative for pallor and wound.  Neurological: Negative for tremors, numbness and headaches.  Hematological: Negative for adenopathy. Does not bruise/bleed easily.  Psychiatric/Behavioral: Negative for dysphoric mood. The patient is not nervous/anxious.        Objective:   Physical Exam  Constitutional: She appears well-developed and well-nourished. No distress.       overwt and well appearing   HENT:  Head: Normocephalic and atraumatic.  Mouth/Throat:  Oropharynx is clear and moist.  Eyes: Conjunctivae are normal. Pupils are equal, round, and reactive to light. Right eye exhibits no discharge. Left eye exhibits no discharge.       Baseline poor vision in R eye   Neck: Normal range of motion. Neck supple. No JVD present. Carotid bruit is not present.  Cardiovascular: Normal rate, regular rhythm and normal heart sounds.   Pulmonary/Chest: Effort normal and breath sounds normal.  Abdominal: Soft. Bowel sounds are normal. She exhibits no abdominal bruit and no mass. There is no tenderness.  Musculoskeletal: She exhibits no edema and no tenderness.  Lymphadenopathy:    She has no cervical adenopathy.  Skin: Skin is warm and dry. Rash noted.       Rash with scale  and erythema over cheeks and bridge of nose/ some L eyelid Appears to be healing  No excoriation or sign of infx   Psychiatric: She has a normal mood and affect.          Assessment & Plan:

## 2011-03-23 NOTE — Assessment & Plan Note (Signed)
Trig and LDL are up  May end up needing statin  Disc low sat fat diet Re check 3 mo with better sugar control

## 2011-04-22 NOTE — Op Note (Signed)
[facility]  Patient:    Barbara [name], Barbara [name] Visit Number: [id number] MRN: [id number]          Service Type: DSU Location: [phone] Attending Physician:  Meredith Leeds Dictated by:   Zigmund Daniel, M.D. Proc. Date: 06/04/02 Admit Date:  06/04/2002 Discharge Date: 06/04/2002                             Operative Report  PREOPERATIVE DIAGNOSIS:  Symptomatic gallstones.  POSTOPERATIVE DIAGNOSIS:  Cholelithiasis with acute and chronic cholecystitis.  OPERATION PERFORMED:  Laparoscopic cholecystectomy with cholangiogram.  SURGEON:  Zigmund Daniel, M.D.  ANESTHESIA:  General.  DESCRIPTION OF PROCEDURE:  After the patient was monitored and underwent general anesthesia and routine preparation and draping of the abdomen, I anesthetized a spot below the umbilicus and made a transverse incision, incised the fascia longitudinally, entered the peritoneum bluntly, placed a 0 Vicryl pursestring suture in the fascia longitudinally, entered the peritoneum bluntly, placed a 0 Vicryl pursestring suture in the fascia and secured a Hasson cannula.  After inflating the abdomen with CO2, I noted that the gallbladder was distended, thick and somewhat inflamed and had a lot of adhesion.  No other abnormalities were noted.  I anesthetized three additional sites, put in three additional ports under direct vision and then decompressed the gallbladder with a suction aspirator.  It was filled with cloudy bile and subsequently during dissection, I found there seemed to be a small amount of overt pus present in the gallbladder.  I retracted the fundus of the gallbladder toward the right shoulder and took down the adhesions with the patient head up, foot down and tilted to the left.  The duodenum was fairly adherent to the infundibulum of the gallbladder but it was separable from it without much dissection.  I pulled an infundibulum laterally and  identified the cystic duct emerging from the infundibulum of the gallbladder.  I clipped it toward the gallbladder, made a small hole in it and put in a cholangiogram catheter.  A fluoroscopic cholangiogram demonstrated a slightly large common bile duct with free flow into the duodenum without any filling defects or other problems noted.  I then put the camera back in, reinflated the abdomen and removed the cholangiogram catheter.  I clipped the cystic duct distally with two clips and divided it.  I then dissected out the cystic artery and similarly clipped and divided it.  I then dissected the gallbladder from the liver utilizing the spatula cautery device.  I noted that it had a lot of gallstones in it.  It was markedly inflamed both chronically and acutely. After detaching it from the liver, I placed it in a plastic pouch.  I then copiously irrigated the gallbladder fossa and got good hemostasis.  I removed the gallbladder through the umbilical incision in the plastic pouch and tied the pursestring suture.  I then removed the remaining irrigant from the right upper quadrant.  I removed the lateral ports under direct vision, then allowed the CO2 to escape and removed the epigastric port.  I closed all skin incisions with intercuticular 4-0 Vicryl suture and Steri-Strips.  The patient tolerated the procedure well. Dictated by:   Zigmund Daniel, M.D. Attending Physician:  Meredith Leeds DD:  06/04/02 TD:  06/06/02 Job: [id number]

## 2011-06-13 ENCOUNTER — Other Ambulatory Visit: Payer: Self-pay | Admitting: *Deleted

## 2011-06-13 MED ORDER — AMLODIPINE BESYLATE 10 MG PO TABS: 10.0000 mg | ORAL_TABLET | Freq: Every day | ORAL | Status: DC

## 2011-06-20 ENCOUNTER — Other Ambulatory Visit (INDEPENDENT_AMBULATORY_CARE_PROVIDER_SITE_OTHER): Payer: Medicare Other | Admitting: Family Medicine

## 2011-06-20 DIAGNOSIS — E7800 Pure hypercholesterolemia, unspecified: Secondary | ICD-10-CM

## 2011-06-20 DIAGNOSIS — I10 Essential (primary) hypertension: Secondary | ICD-10-CM

## 2011-06-20 DIAGNOSIS — E119 Type 2 diabetes mellitus without complications: Secondary | ICD-10-CM

## 2011-06-20 LAB — RENAL FUNCTION PANEL
CO2: 28 mEq/L (ref 19–32)
Calcium: 9.2 mg/dL (ref 8.4–10.5)
Potassium: 3.9 mEq/L (ref 3.5–5.1)
Sodium: 135 mEq/L (ref 135–145)

## 2011-06-20 LAB — LDL CHOLESTEROL, DIRECT: Direct LDL: 131.4 mg/dL

## 2011-06-20 LAB — HEMOGLOBIN A1C: Hgb A1c MFr Bld: 6.6 % — ABNORMAL HIGH (ref 4.6–6.5)

## 2011-06-22 ENCOUNTER — Encounter: Payer: Self-pay | Admitting: Family Medicine

## 2011-06-22 ENCOUNTER — Ambulatory Visit (INDEPENDENT_AMBULATORY_CARE_PROVIDER_SITE_OTHER): Payer: Medicare Other | Admitting: Family Medicine

## 2011-06-22 DIAGNOSIS — E7800 Pure hypercholesterolemia, unspecified: Secondary | ICD-10-CM

## 2011-06-22 DIAGNOSIS — E119 Type 2 diabetes mellitus without complications: Secondary | ICD-10-CM

## 2011-06-22 DIAGNOSIS — I10 Essential (primary) hypertension: Secondary | ICD-10-CM

## 2011-06-22 DIAGNOSIS — R21 Rash and other nonspecific skin eruption: Secondary | ICD-10-CM

## 2011-06-22 MED ORDER — GEMFIBROZIL 600 MG PO TABS: 600.0000 mg | ORAL_TABLET | Freq: Two times a day (BID) | ORAL | Status: DC

## 2011-06-22 NOTE — Patient Instructions (Signed)
Let me know if you want to see a skin doctor (dermatologist ) for your face  Diabetes is improved Decrease your metformin to just in am and let me know if the stomach problems do not get better  Start lopid (gemfibrozil) for your triglycerides (part of cholesterol)- if any side effects (like muscle pain )call and let me know  Schedule fasting labs in 6 weeks to see that it is working  Eat a low sugar and low fat diet  Stay active and keep working on weight loss  Don't forget your eye exam in sept Follow up with me in 6 months with labs prior

## 2011-06-22 NOTE — Assessment & Plan Note (Signed)
Better on 2nd check- pt rushing today Adv to continue more exercise F/u 6 mo  Rev recent labs in detail

## 2011-06-22 NOTE — Progress Notes (Signed)
Subjective:    Patient ID: Denton Lank, female    DOB: 1941-04-30, 70 y.o.   MRN: [id number]  HPI Here for f/u of DM and lipids and HTN   DM is improved - is on metformin and it gives her diarrhea  Wants to dec to qd from bid  a1c came down to 6.6 from 6.8 -going the right direction Is following a DM diet  Wt is down 9 lb also = is proud of that  Working in the garden every day -- lots of exercise  Am sugar - very good 120s or below for the most part Pm sugars generally low 100s to one teens   Lipids- trig still in 400s  LDL 131-that is down Lab Results  Component Value Date   CHOL 273* 06/20/2011   CHOL 304* 03/14/2011   CHOL 272* 08/31/2010   Lab Results  Component Value Date   HDL 48.20 06/20/2011   HDL 40.98 03/14/2011   HDL 47.90 08/31/2010   No results found for this basename: LDLCALC   Lab Results  Component Value Date   TRIG 454.0* 06/20/2011   TRIG 498.0 Triglyceride is over 400; calculations on Lipids are invalid.* 03/14/2011   TRIG 366.0* 08/31/2010   Lab Results  Component Value Date   CHOLHDL 6 06/20/2011   CHOLHDL 7 03/14/2011   CHOLHDL 6 08/31/2010   Lab Results  Component Value Date   LDLDIRECT 131.4 06/20/2011   LDLDIRECT 142.7 03/14/2011   LDLDIRECT 120.6 08/31/2010   not eating any greasy or fatty foods  More [place] burgers  No red meat    HTN - rushing her bp is 140/68 No cp or ha or palp   Rash on mid face is gone with the cream given (steroid) But still on jaw/ periphery of face Little itchy Not painful Does not wear sunscreen at all and refuses to despite risks ? If rash is sun related   Patient Active Problem List  Diagnoses  . DIABETES MELLITUS, TYPE II  . HYPERCHOLESTEROLEMIA  . ANXIETY  . EXPRESSIVE LANGUAGE DISORDER  . ISCHEMIA, RETINAL  . HYPERTENSION  . ALLERGIC RHINITIS  . INTERTRIGO, CANDIDAL  . ANGIOEDEMA  . Rash   Past Medical History  Diagnosis Date  . Allergy   . Anxiety   . Diabetes mellitus   . Hypertension   .  Hyperlipidemia   . GERD (gastroesophageal reflux disease)   . Expressive language disorder   . Retinal ischemia     history of  . Obesity   . Mentally disabled   . Learning disability    Past Surgical History  Procedure Date  . Cholecystectomy    History  Substance Use Topics  . Smoking status: Never Smoker   . Smokeless tobacco: Not on file  . Alcohol Use: Not on file   Family History  Problem Relation Age of Onset  . Heart disease Father   . Hypertension Father   . Heart disease Brother    Allergies  Allergen Reactions  . Atorvastatin     REACTION: increased liver fxn tests  . Ezetimibe     REACTION: stomach upset/malaise  . Sulfonamide Derivatives     REACTION: rash   Current Outpatient Prescriptions on File Prior to Visit  Medication Sig Dispense Refill  . acetaminophen (TYLENOL) 500 MG tablet Take 500 mg by mouth every 6 (six) hours as needed.        Marland Kitchen amLODipine (NORVASC) 10 MG tablet Take 1 tablet (10 mg  total) by mouth daily.  30 tablet  11  . bisoprolol-hydrochlorothiazide (ZIAC) 10-6.25 MG per tablet Take 1 tablet by mouth 2 (two) times daily.  180 tablet  3  . clotrimazole-betamethasone (LOTRISONE) cream Apply topically daily as needed.        . fluticasone (FLONASE) 50 MCG/ACT nasal spray 2 sprays by Nasal route daily.  48 g  3  . Glucose Blood DISK by In Vitro route.        Marland Kitchen omeprazole (PRILOSEC) 20 MG capsule Take 1 capsule (20 mg total) by mouth daily before breakfast.  90 capsule  3  . quinapril (ACCUPRIL) 40 MG tablet Take 1 tablet (40 mg total) by mouth daily. 1 1/2 tablet by mouth daily  135 tablet  3  . sertraline (ZOLOFT) 50 MG tablet Take 1 tablet (50 mg total) by mouth daily.  90 tablet  3  . triamcinolone (KENALOG) 0.025 % cream Apply topically daily. Apply to affected area - small amount once daily as needed for rash  15 g  0         Review of Systems Review of Systems  Constitutional: Negative for fever, appetite change, fatigue and  unexpected weight change.  Eyes: Negative for pain and visual disturbance.  Respiratory: Negative for cough and shortness of breath.   Cardiovascular: Negative.  for cp or palp Gastrointestinal: Negative for nausea, diarrhea and constipation.  Genitourinary: Negative for urgency and frequency.  Skin: Negative for pallor. pos for rash Neurological: Negative for weakness, light-headedness, numbness and headaches.  Hematological: Negative for adenopathy. Does not bruise/bleed easily.  Psychiatric/Behavioral: Negative for dysphoric mood. The patient is not nervous/anxious.          Objective:   Physical Exam  Constitutional: She appears well-developed and well-nourished. No distress.       overwt and well appearing   HENT:  Head: Normocephalic and atraumatic.  Mouth/Throat: Oropharynx is clear and moist.  Eyes: Conjunctivae and EOM are normal. Pupils are equal, round, and reactive to light.  Neck: Normal range of motion. Neck supple. No JVD present. Carotid bruit is not present. No thyromegaly present.  Cardiovascular: Normal rate, regular rhythm and normal heart sounds.   Pulmonary/Chest: Effort normal and breath sounds normal. No respiratory distress. She has no wheezes.  Abdominal: Soft. Bowel sounds are normal. She exhibits no distension, no abdominal bruit and no mass. There is no tenderness.  Musculoskeletal: She exhibits no edema and no tenderness.  Lymphadenopathy:    She has no cervical adenopathy.  Neurological: She is alert. She has normal reflexes. Coordination normal.  Skin: Skin is warm and dry.       Mild redness at jaw without papules or pustules  Some sunburn on arms - counseled pt again about use of sunscreen which she refuses to use  Diffuse solar aging on arms and chest  Psychiatric: She has a normal mood and affect.       Baseline speech impediment           Assessment & Plan:

## 2011-06-22 NOTE — Assessment & Plan Note (Signed)
Trig are main problem- over 400 even with better diet and sugar control Intol to lipitor in past  Will try lopid 600 bid for trig  Adv to update if side eff Rev low fat diet Lab in 6 week F/u 6 mo

## 2011-06-22 NOTE — Assessment & Plan Note (Signed)
Facial rash that was on mid face is much imp but still some around jaw ? Cause  ? If allergic from garden work  Cortisone cream helps  Pt declines derm appt at this time but will consider for later if no imp

## 2011-06-22 NOTE — Assessment & Plan Note (Signed)
Overall this is improved - esp with individual blood sugars (wt loss and metformin help )  However- some diarrhea with bid metformin so pt is asking to change to qd in ams Will try this  F/u 6 mo  Urged to continue wt loss and good habits

## 2011-07-19 ENCOUNTER — Other Ambulatory Visit (INDEPENDENT_AMBULATORY_CARE_PROVIDER_SITE_OTHER): Payer: Medicare Other | Admitting: Family Medicine

## 2011-07-19 DIAGNOSIS — E7800 Pure hypercholesterolemia, unspecified: Secondary | ICD-10-CM

## 2011-07-19 LAB — ALT: ALT: 12 U/L (ref 0–35)

## 2011-07-19 LAB — LDL CHOLESTEROL, DIRECT: Direct LDL: 164.1 mg/dL

## 2011-08-18 ENCOUNTER — Other Ambulatory Visit: Payer: Self-pay

## 2011-08-18 MED ORDER — METFORMIN HCL 500 MG PO TABS: 500.0000 mg | ORAL_TABLET | Freq: Every day | ORAL | Status: DC

## 2011-08-18 NOTE — Telephone Encounter (Signed)
[facility] Faxed refill request Metformin 500mg  #30 x 5.

## 2011-12-19 ENCOUNTER — Telehealth: Payer: Self-pay | Admitting: Family Medicine

## 2011-12-19 DIAGNOSIS — E119 Type 2 diabetes mellitus without complications: Secondary | ICD-10-CM

## 2011-12-19 DIAGNOSIS — I10 Essential (primary) hypertension: Secondary | ICD-10-CM

## 2011-12-19 DIAGNOSIS — E7800 Pure hypercholesterolemia, unspecified: Secondary | ICD-10-CM

## 2011-12-19 NOTE — Telephone Encounter (Signed)
Message copied by Judy Pimple on Mon Dec 19, 2011  8:53 PM ------      Message from: Alvina Chou      Created: Mon Dec 19, 2011  8:23 AM      Regarding: labs for 12-20-11       6 month labs

## 2011-12-20 ENCOUNTER — Other Ambulatory Visit (INDEPENDENT_AMBULATORY_CARE_PROVIDER_SITE_OTHER): Payer: Medicare Other

## 2011-12-20 DIAGNOSIS — E7800 Pure hypercholesterolemia, unspecified: Secondary | ICD-10-CM

## 2011-12-20 DIAGNOSIS — I10 Essential (primary) hypertension: Secondary | ICD-10-CM

## 2011-12-20 DIAGNOSIS — E119 Type 2 diabetes mellitus without complications: Secondary | ICD-10-CM

## 2011-12-20 LAB — CBC WITH DIFFERENTIAL/PLATELET
Basophils Relative: 1 % (ref 0.0–3.0)
Eosinophils Relative: 1.5 % (ref 0.0–5.0)
Hemoglobin: 11.4 g/dL — ABNORMAL LOW (ref 12.0–15.0)
Lymphocytes Relative: 34.3 % (ref 12.0–46.0)
Monocytes Relative: 11.8 % (ref 3.0–12.0)
Neutro Abs: 2.3 10*3/uL (ref 1.4–7.7)
RBC: 3.59 Mil/uL — ABNORMAL LOW (ref 3.87–5.11)

## 2011-12-20 LAB — LDL CHOLESTEROL, DIRECT: Direct LDL: 174.1 mg/dL

## 2011-12-20 LAB — HEMOGLOBIN A1C: Hgb A1c MFr Bld: 6.4 % (ref 4.6–6.5)

## 2011-12-20 LAB — COMPREHENSIVE METABOLIC PANEL
Alkaline Phosphatase: 43 U/L (ref 39–117)
Creatinine, Ser: 0.8 mg/dL (ref 0.4–1.2)
GFR: 73.28 mL/min (ref >60.00)
Glucose, Bld: 175 mg/dL — ABNORMAL HIGH (ref 70–99)
Sodium: 138 mEq/L (ref 135–145)
Total Bilirubin: 0.5 mg/dL (ref 0.3–1.2)
Total Protein: 7.8 g/dL (ref 6.0–8.3)

## 2011-12-20 LAB — LIPID PANEL
Cholesterol: 285 mg/dL — ABNORMAL HIGH (ref 0–200)
HDL: 44.5 mg/dL (ref >39.00)
Triglycerides: 274 mg/dL — ABNORMAL HIGH (ref 0.0–149.0)
VLDL: 54.8 mg/dL — ABNORMAL HIGH (ref 0.0–40.0)

## 2011-12-26 ENCOUNTER — Encounter: Payer: Self-pay | Admitting: Family Medicine

## 2011-12-26 ENCOUNTER — Ambulatory Visit (INDEPENDENT_AMBULATORY_CARE_PROVIDER_SITE_OTHER): Payer: Medicare Other | Admitting: Family Medicine

## 2011-12-26 VITALS — BP 130/80 | HR 60 | Temp 98.1°F | Ht 64.5 in | Wt 187.5 lb

## 2011-12-26 DIAGNOSIS — E669 Obesity, unspecified: Secondary | ICD-10-CM

## 2011-12-26 DIAGNOSIS — N950 Postmenopausal bleeding: Secondary | ICD-10-CM

## 2011-12-26 DIAGNOSIS — I10 Essential (primary) hypertension: Secondary | ICD-10-CM

## 2011-12-26 DIAGNOSIS — E7800 Pure hypercholesterolemia, unspecified: Secondary | ICD-10-CM

## 2011-12-26 DIAGNOSIS — D649 Anemia, unspecified: Secondary | ICD-10-CM | POA: Insufficient documentation

## 2011-12-26 DIAGNOSIS — E119 Type 2 diabetes mellitus without complications: Secondary | ICD-10-CM

## 2011-12-26 NOTE — Assessment & Plan Note (Signed)
Lipids up  Pt on gemfibrozil Cannot take statins due to LFT elevation Rev low sat fat diet in detail Will continue to watch

## 2011-12-26 NOTE — Assessment & Plan Note (Signed)
This is overall improved - with controlled a1c with diet  Stressed imp of wt loss  Rev low glycemic diet F/u 6 mo  utd opthy

## 2011-12-26 NOTE — Progress Notes (Signed)
Subjective:    Patient ID: Barbara [name], female    DOB: 02/11/1941, 71 y.o.   MRN: [id number]  HPI Here for f/u of DM and lipids and HTN with new anemia  No new symptoms   Wt is up 6 lb with bmi of 31 Activity-is starting to walk again when warm again , will do some walking in the house  Diet - doing good , is staying away from the fatty foods and sugar/ starches Was not good duing the holidays   DM  a1c is down to 6.4 from 6.6- in very good control with sugar  opthy -- will f/u in march -may have eye surgery  Overall well controlled with metformin  No low blood sugars - checks every day -- at most 150 in the ams    bp is  130/80   Today- well controlled No cp or palpitations or headaches or edema  No side effects to medicines     Chemistry      Component Value Date/Time   NA 138 12/20/2011 1015   K 3.9 12/20/2011 1015   CL 101 12/20/2011 1015   CO2 27 12/20/2011 1015   BUN 23 12/20/2011 1015   CREATININE 0.8 12/20/2011 1015      Component Value Date/Time   CALCIUM 9.6 12/20/2011 1015   ALKPHOS 43 12/20/2011 1015   AST 23 12/20/2011 1015   ALT 15 12/20/2011 1015   BILITOT 0.5 12/20/2011 1015       Lipids are up  Cannot tolerate lipitor /other statins due to inc in LFTS (which are normal now) Diet  Is on lipid for her trig Lab Results  Component Value Date   CHOL 285* 12/20/2011   CHOL 276* 07/19/2011   CHOL 273* 06/20/2011   Lab Results  Component Value Date   HDL 44.50 12/20/2011   HDL 40.98 07/19/2011   HDL 11.91 06/20/2011   No results found for this basename: LDLCALC   Lab Results  Component Value Date   TRIG 274.0* 12/20/2011   TRIG 268.0* 07/19/2011   TRIG 454.0* 06/20/2011   Lab Results  Component Value Date   CHOLHDL 6 12/20/2011   CHOLHDL 6 07/19/2011   CHOLHDL 6 06/20/2011   Lab Results  Component Value Date   LDLDIRECT 174.1 12/20/2011   LDLDIRECT 164.1 07/19/2011   LDLDIRECT 131.4 06/20/2011  diet was bad after the holidays -- and now eats butter on her  potato Is staying away from fried food- occ hamburger    New anemia  Lab Results  Component Value Date   WBC 4.5 12/20/2011   HGB 11.4* 12/20/2011   HCT 33.1* 12/20/2011   MCV 92.4 12/20/2011   PLT 278.0 12/20/2011   (last hb was over 13) Has been more tired than usual No pallor  GI screening - absolutely will not do colonoscopy - even if she has cancer states " just wants to die" No change in bowel habits No blood in stool   She does get a period every 3-4 months  Is spotting  Was not concerned about it  No cramping/ bloating or other symptoms She does however - feel better afterwards  Patient Active Problem List  Diagnoses  . DIABETES MELLITUS, TYPE II  . HYPERCHOLESTEROLEMIA  . ANXIETY  . EXPRESSIVE LANGUAGE DISORDER  . ISCHEMIA, RETINAL  . HYPERTENSION  . ALLERGIC RHINITIS  . INTERTRIGO, CANDIDAL  . ANGIOEDEMA  . Rash  . Obesity (BMI 30-39.9)  . Anemia  . Post-menopausal bleeding  Past Medical History  Diagnosis Date  . Allergy   . Anxiety   . Diabetes mellitus   . Hypertension   . Hyperlipidemia   . GERD (gastroesophageal reflux disease)   . Expressive language disorder   . Retinal ischemia     history of  . Obesity   . Mentally disabled   . Learning disability    Past Surgical History  Procedure Date  . Cholecystectomy    History  Substance Use Topics  . Smoking status: Never Smoker   . Smokeless tobacco: Not on file  . Alcohol Use: Not on file   Family History  Problem Relation Age of Onset  . Heart disease Father   . Hypertension Father   . Heart disease Brother    Allergies  Allergen Reactions  . Atorvastatin     REACTION: increased liver fxn tests  . Ezetimibe     REACTION: stomach upset/malaise  . Sulfonamide Derivatives     REACTION: rash   Current Outpatient Prescriptions on File Prior to Visit  Medication Sig Dispense Refill  . acetaminophen (TYLENOL) 500 MG tablet Take 500 mg by mouth every 6 (six) hours as needed.          Marland Kitchen amLODipine (NORVASC) 10 MG tablet Take 1 tablet (10 mg total) by mouth daily.  30 tablet  11  . bisoprolol-hydrochlorothiazide (ZIAC) 10-6.25 MG per tablet Take 1 tablet by mouth 2 (two) times daily.  180 tablet  3  . fluticasone (FLONASE) 50 MCG/ACT nasal spray 2 sprays by Nasal route daily.  48 g  3  . gemfibrozil (LOPID) 600 MG tablet Take 1 tablet (600 mg total) by mouth 2 (two) times daily.  60 tablet  11  . Glucose Blood DISK by In Vitro route.        . metFORMIN (GLUCOPHAGE) 500 MG tablet Take 1 tablet (500 mg total) by mouth daily with breakfast.  30 tablet  5  . omeprazole (PRILOSEC) 20 MG capsule Take 1 capsule (20 mg total) by mouth daily before breakfast.  90 capsule  3  . quinapril (ACCUPRIL) 40 MG tablet Take 1 tablet (40 mg total) by mouth daily. 1 1/2 tablet by mouth daily  135 tablet  3  . sertraline (ZOLOFT) 50 MG tablet Take 1 tablet (50 mg total) by mouth daily.  90 tablet  3  . clotrimazole-betamethasone (LOTRISONE) cream Apply topically daily as needed.        . triamcinolone (KENALOG) 0.025 % cream Apply topically daily. Apply to affected area - small amount once daily as needed for rash  15 g  0        Review of Systems Review of Systems  Constitutional: Negative for fever, appetite change, fatigue and unexpected weight change.  Eyes: Negative for pain and visual disturbance.  Respiratory: Negative for cough and shortness of breath.   Cardiovascular: Negative for cp or palpitations    Gastrointestinal: Negative for nausea, diarrhea and constipation. neg for abd pain / blood in stool or dark stool Genitourinary: Negative for urgency and frequency. pos for vaginal bleeding at times / neg for cramps  Skin: Negative for pallor or rash   Neurological: Negative for weakness, light-headedness, numbness and headaches.  Hematological: Negative for adenopathy. Does not bruise/bleed easily.  Psychiatric/Behavioral: Negative for dysphoric mood. The patient is occ anxious  about prev divorce        Objective:   Physical Exam  Constitutional: She appears well-developed and well-nourished. No distress.  Obese and generally well appearing   HENT:  Head: Normocephalic and atraumatic.  Mouth/Throat: Oropharynx is clear and moist.  Eyes: Conjunctivae and EOM are normal. Pupils are equal, round, and reactive to light. No scleral icterus.  Neck: Normal range of motion. Neck supple. No JVD present. Carotid bruit is not present. No thyromegaly present.  Cardiovascular: Normal rate, regular rhythm, normal heart sounds and intact distal pulses.   No murmur heard. Pulmonary/Chest: Effort normal and breath sounds normal. No respiratory distress. She has no wheezes.  Abdominal: Soft. Bowel sounds are normal. She exhibits no distension, no abdominal bruit and no mass. There is no tenderness.       No suprapubic tenderness    Musculoskeletal: Normal range of motion. She exhibits no edema and no tenderness.  Lymphadenopathy:    She has no cervical adenopathy.  Neurological: She is alert. She has normal reflexes. No cranial nerve deficit. She exhibits normal muscle tone. Coordination normal.       Baseline speech impediment- is very difficult to understand at times  Skin: Skin is warm and dry. No rash noted. No erythema. No pallor.  Psychiatric: She has a normal mood and affect.       Cheerful/ talkative Baseline low intelligence and speech impediment            Assessment & Plan:

## 2011-12-26 NOTE — Assessment & Plan Note (Signed)
This is not nl  Also a bit anemic  Set up pelvic US

## 2011-12-26 NOTE — Assessment & Plan Note (Signed)
This is new and very mild -- pt does feel a bit tired She declines any GI work up at all - stating she would not treat a GI cancer if she had it  Also mentioned some vaginal bleeding- will get pelvic US for that

## 2011-12-26 NOTE — Assessment & Plan Note (Signed)
Explained to pt imp to health for wt loss  Pt not seemingly that motivated Urged to cut portions and exercise daily-set up a plan F/u 6 mo

## 2011-12-26 NOTE — Assessment & Plan Note (Signed)
bp in fair control at this time  No changes needed  Disc lifstyle change with low sodium diet and exercise   

## 2011-12-26 NOTE — Patient Instructions (Addendum)
If you are interested in shingles vaccine in future - call your insurance company to see how coverage is and call us to schedule or get it at a pharmacy Your weight is up - so cut portions by 1/4 , and also exercise every day in or outside Your cholesterol is going up   (Avoid red meat/ fried foods/ egg yolks/ fatty breakfast meats/ butter, cheese and high fat dairy/ and shellfish  )  Because you have had some vaginal bleeding - I am going to refer you for a pelvic ultrasound at check  Follow up with me in about 6 months with labs prior (annual exam )  You are anemic -- so if you change your mind about a colonoscopy, please let me know

## 2011-12-27 ENCOUNTER — Ambulatory Visit: Payer: Self-pay | Admitting: Family Medicine

## 2011-12-28 ENCOUNTER — Encounter: Payer: Self-pay | Admitting: Family Medicine

## 2012-02-15 ENCOUNTER — Other Ambulatory Visit (HOSPITAL_COMMUNITY)
Admission: RE | Admit: 2012-02-15 | Discharge: 2012-02-15 | Disposition: A | Discharge status: Home / Self Care | Payer: Medicare Other | Source: Ambulatory Visit | Attending: Family Medicine | Admitting: Family Medicine

## 2012-02-15 ENCOUNTER — Encounter: Payer: Self-pay | Admitting: Family Medicine

## 2012-02-15 ENCOUNTER — Telehealth: Payer: Self-pay | Admitting: *Deleted

## 2012-02-15 ENCOUNTER — Ambulatory Visit (INDEPENDENT_AMBULATORY_CARE_PROVIDER_SITE_OTHER): Payer: Medicare Other | Admitting: Family Medicine

## 2012-02-15 VITALS — BP 140/74 | HR 64 | Temp 97.8°F | Ht 64.5 in | Wt 188.8 lb

## 2012-02-15 DIAGNOSIS — Z01419 Encounter for gynecological examination (general) (routine) without abnormal findings: Secondary | ICD-10-CM | POA: Insufficient documentation

## 2012-02-15 DIAGNOSIS — N950 Postmenopausal bleeding: Secondary | ICD-10-CM

## 2012-02-15 DIAGNOSIS — R87810 Cervical high risk human papillomavirus (HPV) DNA test positive: Secondary | ICD-10-CM | POA: Insufficient documentation

## 2012-02-15 NOTE — Progress Notes (Signed)
Subjective:    Patient ID: Barbara [name], female    DOB: 29-Jun-1941, 71 y.o.   MRN: [id number]  HPI Here for f/u of post menopausal bleeding  Has menses every 3-4 months  Is not aware this was abnormal   Is feeling fine  No wt change  No vaginal discharge or pain No hx of abn paps or gyn surgery   No family history   Pt entirely declines mammograms and most health mt   Patient Active Problem List  Diagnoses  . DIABETES MELLITUS, TYPE II  . HYPERCHOLESTEROLEMIA  . ANXIETY  . EXPRESSIVE LANGUAGE DISORDER  . ISCHEMIA, RETINAL  . HYPERTENSION  . ALLERGIC RHINITIS  . INTERTRIGO, CANDIDAL  . ANGIOEDEMA  . Obesity (BMI 30-39.9)  . Anemia  . Post-menopausal bleeding  . Gynecological examination   Past Medical History  Diagnosis Date  . Allergy   . Anxiety   . Diabetes mellitus   . Hypertension   . Hyperlipidemia   . GERD (gastroesophageal reflux disease)   . Expressive language disorder   . Retinal ischemia     history of  . Obesity   . Mentally disabled   . Learning disability    Past Surgical History  Procedure Date  . Cholecystectomy    History  Substance Use Topics  . Smoking status: Never Smoker   . Smokeless tobacco: Not on file  . Alcohol Use: Not on file   Family History  Problem Relation Age of Onset  . Heart disease Father   . Hypertension Father   . Heart disease Brother    Allergies  Allergen Reactions  . Atorvastatin     REACTION: increased liver fxn tests  . Ezetimibe     REACTION: stomach upset/malaise  . Sulfonamide Derivatives     REACTION: rash   Current Outpatient Prescriptions on File Prior to Visit  Medication Sig Dispense Refill  . acetaminophen (TYLENOL) 500 MG tablet Take 500 mg by mouth every 6 (six) hours as needed.        Marland Kitchen amLODipine (NORVASC) 10 MG tablet Take 1 tablet (10 mg total) by mouth daily.  30 tablet  11  . bisoprolol-hydrochlorothiazide (ZIAC) 10-6.25 MG per tablet Take 1 tablet by mouth 2 (two) times daily.   180 tablet  3  . clotrimazole-betamethasone (LOTRISONE) cream Apply topically daily as needed.        . fluticasone (FLONASE) 50 MCG/ACT nasal spray 2 sprays by Nasal route daily.  48 g  3  . gemfibrozil (LOPID) 600 MG tablet Take 1 tablet (600 mg total) by mouth 2 (two) times daily.  60 tablet  11  . Glucose Blood DISK by In Vitro route.        . metFORMIN (GLUCOPHAGE) 500 MG tablet Take 1 tablet (500 mg total) by mouth daily with breakfast.  30 tablet  5  . omeprazole (PRILOSEC) 20 MG capsule Take 1 capsule (20 mg total) by mouth daily before breakfast.  90 capsule  3  . quinapril (ACCUPRIL) 40 MG tablet Take 1 tablet (40 mg total) by mouth daily. 1 1/2 tablet by mouth daily  135 tablet  3  . sertraline (ZOLOFT) 50 MG tablet Take 1 tablet (50 mg total) by mouth daily.  90 tablet  3  . triamcinolone (KENALOG) 0.025 % cream Apply topically daily. Apply to affected area - small amount once daily as needed for rash  15 g  0        Review of Systems Review  of Systems  Constitutional: Negative for fever, appetite change, fatigue and unexpected weight change.  Eyes: Negative for pain and visual disturbance.  Respiratory: Negative for cough and shortness of breath.   Cardiovascular: Negative for cp or palpitations    Gastrointestinal: Negative for nausea, diarrhea and constipation.  Genitourinary: Negative for urgency and frequency. pos for occasional vaginal bleeding , neg for pelvic pain   Skin: Negative for pallor or rash   Neurological: Negative for weakness, light-headedness, numbness and headaches.  Hematological: Negative for adenopathy. Does not bruise/bleed easily.  Psychiatric/Behavioral: Negative for dysphoric mood. The patient is not nervous/anxious.          Objective:   Physical Exam  Constitutional: She appears well-developed and well-nourished. No distress.       overwt and well appearing   HENT:  Head: Normocephalic and atraumatic.  Mouth/Throat: Oropharynx is clear  and moist.  Eyes: Conjunctivae and EOM are normal. Pupils are equal, round, and reactive to light. No scleral icterus.  Neck: Normal range of motion. Neck supple. No JVD present. No thyromegaly present.  Cardiovascular: Normal rate, regular rhythm and normal heart sounds.   Pulmonary/Chest: Effort normal and breath sounds normal. No respiratory distress. She has no wheezes.  Abdominal: Soft. Bowel sounds are normal. She exhibits no distension and no mass. There is no tenderness. There is no rebound and no guarding.       No suprapubic tenderness    Genitourinary: Rectum normal. No breast swelling, tenderness, discharge or bleeding. There is no rash, tenderness or lesion on the right labia. There is no rash, tenderness or lesion on the left labia. Uterus is not enlarged and not tender. Cervix exhibits no discharge and no friability. Right adnexum displays no mass, no tenderness and no fullness. Left adnexum displays no mass, no tenderness and no fullness. There is bleeding around the vagina. No tenderness around the vagina. No vaginal discharge found.       Difficult exam - small introitus Breast exam: No mass, nodules, thickening, tenderness, bulging, retraction, inflamation, nipple discharge or skin changes noted.  No axillary or clavicular LA.  Chaperoned exam.    o External genitalia nl app o Urethral meatus nl app o Urethra  Nl app and mobile, no lesions o Bladder nt o Vagina small introitus with atrophic change and thin mucosa that bleeds easily o Cervix  Difficult to visualize/ nt o Uterus  nt o Adnexa/parametria no M noted  o Anus and perineum nl app   Lymphadenopathy:    She has no cervical adenopathy.  Neurological: She is alert.  Skin: Skin is warm and dry.  Psychiatric: She has a normal mood and affect.       Baseline speech impediment  Cheerful overall  Tolerated pelvic exam well            Assessment & Plan:

## 2012-02-15 NOTE — Patient Instructions (Signed)
The vaginal bleeding that you have is not normal  I am worried there is something wrong - and it could be cancer (but I'm not sure)  My next step is to refer you to a specialist in gynecology  I understand you do not want to go or find out what is wrong  If you change your mind - please call and let us know I think it is important that you get this checked out

## 2012-02-15 NOTE — Assessment & Plan Note (Addendum)
Done today Atrophic vagina- and tight introitus- difficult exam Pap attempted Pt refuses any further w/u for post menop bleed-- but her guardian called and does want ref done

## 2012-02-15 NOTE — Assessment & Plan Note (Addendum)
Nl [place] is reassuring Attempted exam and pap today- is difficult with small introitus  Also some bleeding noted (? If from trauma of exam or uterine) Vag mucosa is hyperemic and thin  I strongly recommend gyn ref for this - she strongly refuses saying she would not treat cancer if she had it  Explained that she can call if she changes her mind >25 min spent with face to face with patient, >50% counseling and/or coordinating care  After visit her guardian called and stated after reviewing this that she did want a gyn ref done- so ref made

## 2012-02-15 NOTE — Telephone Encounter (Signed)
Pt's sister, [name], is upset that pt was given a visit summary at her last visit regarding the possibility that she could have a gyn cancer.  Sister says she is the patient's guardian and she should have gotten a copy of that summary instead of the patient.  Says the patient doesn't understand or comprehend these things and sister wants to make sure that she is informed of these type of issues in the future.  There is a copy of the guardianship letter under media in chart review.  I apologized to the sister and explained that these letters get scanned into the chart and unless these instructions are brought to our attention we don't know the requests.  Sister said she understood.  She does want the patient referred to gyn, has no preference where.

## 2012-02-15 NOTE — Telephone Encounter (Signed)
Sorry , I was completely unaware of that - in the future will take note (and I would have had her sister come with her today had I known)  I do not want to do anything that Barbara [name] does not want to do so please make sure they have an open discussion with one and other about her wishes  If there is anything else I need to know about her ability to make decisions/ etc please let me know because she often does come to appts alone  Thanks -- and I will do the referral (will attch to her office visit)

## 2012-02-16 ENCOUNTER — Telehealth: Payer: Self-pay | Admitting: Family Medicine

## 2012-02-16 NOTE — Telephone Encounter (Signed)
Talked with patient's sister, Aniceto Boss, and explained that Dr. Milinda Antis would have discussed with her that day however patient's sister was not at visit with patient.  Patient's sister, Aniceto Boss, does have Guardian documentation on the chart and asked her to also come in to complete a designated party release form to allow [place] to document that in the patient's chart as well.    Aniceto Boss was also concerned that someone had called her sister today and shared information and that it upset her sister again. I explained that we had been discussing the concern today and that I didn't believe it was our staff because I had asked them not to call back until I had talked to [facility] personally.    Alma did not mind coming in to complete the designated form and said she would be satisfied with a call back from the doctor or CMA to ask questions and discuss information shared with sister.

## 2012-02-16 NOTE — Telephone Encounter (Signed)
We did find the guardian form - so now I am up to speed - if she signs the other form then I can share info just with her From now on I will  be clear never to share info with patient unless guardian is present or on the phone with [place] Thanks so much for clarifying that  I do not know who called her today - so I will have to let you take it from here -thanks

## 2012-02-17 ENCOUNTER — Telehealth: Payer: Self-pay | Admitting: Family Medicine

## 2012-02-17 MED ORDER — FLUTICASONE PROPIONATE 50 MCG/ACT NA SUSP: 2.0000 | Freq: Every day | NASAL | Status: DC

## 2012-02-17 MED ORDER — QUINAPRIL HCL 40 MG PO TABS: 40.0000 mg | ORAL_TABLET | Freq: Every day | ORAL | Status: DC

## 2012-02-17 MED ORDER — BISOPROLOL-HYDROCHLOROTHIAZIDE 10-6.25 MG PO TABS: 1.0000 | ORAL_TABLET | Freq: Two times a day (BID) | ORAL | Status: DC

## 2012-02-17 MED ORDER — OMEPRAZOLE 20 MG PO CPDR: 20.0000 mg | DELAYED_RELEASE_CAPSULE | Freq: Every day | ORAL | Status: DC

## 2012-02-17 MED ORDER — SERTRALINE HCL 50 MG PO TABS: 50.0000 mg | ORAL_TABLET | Freq: Every day | ORAL | Status: DC

## 2012-02-17 MED ORDER — METFORMIN HCL 500 MG PO TABS: 500.0000 mg | ORAL_TABLET | Freq: Every day | ORAL | Status: DC

## 2012-02-17 MED ORDER — AMLODIPINE BESYLATE 10 MG PO TABS: 10.0000 mg | ORAL_TABLET | Freq: Every day | ORAL | Status: DC

## 2012-02-17 MED ORDER — GEMFIBROZIL 600 MG PO TABS: 600.0000 mg | ORAL_TABLET | Freq: Two times a day (BID) | ORAL | Status: DC

## 2012-02-17 NOTE — Telephone Encounter (Signed)
Patient and sister, Aniceto Boss, were in office today and completed the [employer] form so either Dr. Milinda Antis or CMA can call back to answer questions and talk with Alma.  I told her we would do this prior to calling to make the referral.  Please let Shirlee Limerick know how to proceed after you have talked to her to confirm referral and when it is okay for Shirlee Limerick to call the sister to make referral. Thanks

## 2012-02-17 NOTE — Telephone Encounter (Signed)
Thanks for the [employer], when you call pt's gaurdian - I would love them to go to [facility] if possible  Let me know-thanks

## 2012-02-17 NOTE — Telephone Encounter (Signed)
Spoke with [name], pts sister and guardian. Ms [name] has spoken with pt and pt is agreeable to go for GYN referral. Ms [name] will wait to hear from pt care coordinator about GYN appt. Ms [name] said she wants pt to see the best GYN we can refer to; she said the office on [address] would be most convenient but if that GYN is not the best, Ms [name] will take pt to either [facility] or [place]. Again Ms [name] emphasized her wanting pt to see the best GYN pt could see. [name] also wants a call with the pap smear report when that comes back Ms [name] can be reached at [phone] or cell [phone]. Ms [name] did say she will try to come with pt for her appts from now on. In the past her sister has brought Ms Wander but does not go back to the examining room with pt. (Ms [name] said that pt will usually tell her sister she does not have to go back with her she can go by herself). Ms [name] was very nice and did not mention needing to speak with Dr Milinda Antis.

## 2012-02-17 NOTE — Telephone Encounter (Signed)
Dr Milinda Antis is it OK if I call and speak with Barbara [name] about GYN appt or would you rather speak with her.?

## 2012-02-17 NOTE — Telephone Encounter (Signed)
Go ahead and talk to her - just not to Ms Mannings please - then let me know if I need to call her too Just ask if they still want to go forward with the gyn referral -thanks

## 2012-02-17 NOTE — Telephone Encounter (Signed)
Sending refils to [facility]

## 2012-02-20 NOTE — Telephone Encounter (Signed)
Appt made with Dr Marice Potter on 02/29/2012 at [facility]. Called [facility] and they dont accept her insurance. Sister notified of this appt.

## 2012-03-01 ENCOUNTER — Encounter: Payer: Self-pay | Admitting: Obstetrics & Gynecology

## 2012-03-01 ENCOUNTER — Ambulatory Visit (INDEPENDENT_AMBULATORY_CARE_PROVIDER_SITE_OTHER): Payer: Medicare Other | Admitting: Obstetrics & Gynecology

## 2012-03-01 VITALS — BP 157/77 | HR 61 | Ht 64.0 in | Wt 191.0 lb

## 2012-03-01 DIAGNOSIS — N950 Postmenopausal bleeding: Secondary | ICD-10-CM

## 2012-03-01 NOTE — Progress Notes (Addendum)
  Subjective:    Patient ID: Barbara [name], female    DOB: May 14, 1941, 71 y.o.   MRN: [id number]  HPI  Barbara [name] is a single 71 yo lady who is referred here by Dr. Vinnie Langton with the complaint of PMB. After a fairly long discussion with her, it seems that she may have never stopped having vaginal bleeding. She will notice some spotting every few months. The amount is such that she never has to wear a panty liner or pad. Dr. Milinda Antis ordered an ultrasound and it was normal with a endometrial lining of 2.9 mm. This thickness rules out 99% of endometrial cancer risk. Review of Systems    Her TSH 1/13 was 35. I will send her back to Dr. Milinda Antis to further evaluate this Objective:   Physical Exam  Severe vulvar atrophy with bilateral 1 cm excoriations of introitus. Her vaginal vault appears atrophic as does her cervix, but otherwise normal.      Assessment & Plan:  Check fSH I have offered her a hysteroscopy, D&C if she wants to rule out ca 100%. She declines at this time.  Because she denies incontinece and denies vulvar discomfort, I'm not going to prescribe vaginal estrogen at this time. She agrees with this plan.

## 2012-04-11 ENCOUNTER — Telehealth: Payer: Self-pay | Admitting: Family Medicine

## 2012-04-11 NOTE — Telephone Encounter (Signed)
Her last thyroid test indicated possible low thyroid function and I want to re check this when convenient for her, tsh and free T4 nonfasting  If still off would contemplate starting thyroid supplement medicine

## 2012-04-11 NOTE — Telephone Encounter (Signed)
Alma advised as instructed via telephone, she will call back today to schedule lab appt.

## 2012-04-11 NOTE — Telephone Encounter (Signed)
[name], Sister calling. [phone]. States she rec'd call from [employer] to call about Barbara [name], Barbara [name].  1941-06-25.  Please call with information.  [name] has been on vacation.  Thank you.

## 2012-04-13 ENCOUNTER — Other Ambulatory Visit: Payer: Self-pay | Admitting: Family Medicine

## 2012-04-13 DIAGNOSIS — R946 Abnormal results of thyroid function studies: Secondary | ICD-10-CM

## 2012-04-16 ENCOUNTER — Other Ambulatory Visit (INDEPENDENT_AMBULATORY_CARE_PROVIDER_SITE_OTHER): Payer: Medicare Other

## 2012-04-16 ENCOUNTER — Telehealth: Payer: Self-pay | Admitting: Family Medicine

## 2012-04-16 DIAGNOSIS — R946 Abnormal results of thyroid function studies: Secondary | ICD-10-CM

## 2012-04-16 LAB — T4, FREE: Free T4: 0.32 ng/dL — ABNORMAL LOW (ref 0.60–1.60)

## 2012-04-16 LAB — TSH: TSH: 39.53 u[IU]/mL — ABNORMAL HIGH (ref 0.35–5.50)

## 2012-04-16 MED ORDER — LEVOTHYROXINE SODIUM 50 MCG PO TABS: 50.0000 ug | ORAL_TABLET | Freq: Every day | ORAL | Status: DC

## 2012-04-16 NOTE — Telephone Encounter (Signed)
Please let pt's gaurdian know that she needs to start a thyroid supplement for hypothyroidism Will start with synthroid 50 mcg and then titrate up as needed Px written for call in   Schedule lab 6 wk for tsh please  Will discuss further at next f/u

## 2012-04-17 MED ORDER — LEVOTHYROXINE SODIUM 50 MCG PO TABS: 50.0000 ug | ORAL_TABLET | Freq: Every day | ORAL | Status: DC

## 2012-04-17 NOTE — Telephone Encounter (Signed)
Advised [name] as instructed via telephone, Rx sent to [facility].  Lab appt scheduled for 05/2012.

## 2012-04-18 ENCOUNTER — Ambulatory Visit (INDEPENDENT_AMBULATORY_CARE_PROVIDER_SITE_OTHER): Payer: Medicare Other | Admitting: *Deleted

## 2012-04-18 DIAGNOSIS — Z2911 Encounter for prophylactic immunotherapy for respiratory syncytial virus (RSV): Secondary | ICD-10-CM

## 2012-04-18 DIAGNOSIS — Z23 Encounter for immunization: Secondary | ICD-10-CM

## 2012-05-30 ENCOUNTER — Other Ambulatory Visit: Payer: Self-pay | Admitting: Family Medicine

## 2012-05-30 ENCOUNTER — Other Ambulatory Visit (INDEPENDENT_AMBULATORY_CARE_PROVIDER_SITE_OTHER): Payer: Medicare Other

## 2012-05-30 DIAGNOSIS — R7989 Other specified abnormal findings of blood chemistry: Secondary | ICD-10-CM

## 2012-05-30 DIAGNOSIS — R946 Abnormal results of thyroid function studies: Secondary | ICD-10-CM

## 2012-05-30 LAB — TSH: TSH: 13.82 u[IU]/mL — ABNORMAL HIGH (ref 0.35–5.50)

## 2012-05-31 ENCOUNTER — Telehealth: Payer: Self-pay | Admitting: Family Medicine

## 2012-05-31 MED ORDER — LEVOTHYROXINE SODIUM 75 MCG PO TABS: 75.0000 ug | ORAL_TABLET | Freq: Every day | ORAL | Status: DC

## 2012-05-31 NOTE — Telephone Encounter (Signed)
Alma Gaylord Seydel left v/m returning call. Called [phone] left v/m for Ms [name] to call back.

## 2012-05-31 NOTE — Telephone Encounter (Signed)
Message left for patient to return my call.  

## 2012-05-31 NOTE — Telephone Encounter (Signed)
Please speak to her guardian, her tsh is improving but I still need to increase med Px written for call in

## 2012-06-18 ENCOUNTER — Telehealth: Payer: Self-pay | Admitting: Family Medicine

## 2012-06-18 DIAGNOSIS — E119 Type 2 diabetes mellitus without complications: Secondary | ICD-10-CM

## 2012-06-18 DIAGNOSIS — I10 Essential (primary) hypertension: Secondary | ICD-10-CM

## 2012-06-18 DIAGNOSIS — E7800 Pure hypercholesterolemia, unspecified: Secondary | ICD-10-CM

## 2012-06-18 DIAGNOSIS — E039 Hypothyroidism, unspecified: Secondary | ICD-10-CM

## 2012-06-18 NOTE — Telephone Encounter (Signed)
Alma Shiana Rappleye left v/m returning Kim's call Ms [name] can be reached [phone].

## 2012-06-18 NOTE — Telephone Encounter (Signed)
Spoke with [employer]. Barbara [name] has already increased synthroid.

## 2012-06-18 NOTE — Telephone Encounter (Signed)
Message copied by Judy Pimple on Mon Jun 18, 2012  8:06 AM ------      Message from: Alvina Chou      Created: Wed Jun 13, 2012  3:38 PM      Regarding: Lab orders for Tues,7-16.13       Patient is scheduled for CPX labs, please order future labs, Thanks , Camelia Eng

## 2012-06-19 ENCOUNTER — Other Ambulatory Visit (INDEPENDENT_AMBULATORY_CARE_PROVIDER_SITE_OTHER): Payer: PRIVATE HEALTH INSURANCE

## 2012-06-19 DIAGNOSIS — E7800 Pure hypercholesterolemia, unspecified: Secondary | ICD-10-CM

## 2012-06-19 DIAGNOSIS — I10 Essential (primary) hypertension: Secondary | ICD-10-CM

## 2012-06-19 DIAGNOSIS — E039 Hypothyroidism, unspecified: Secondary | ICD-10-CM

## 2012-06-19 DIAGNOSIS — E119 Type 2 diabetes mellitus without complications: Secondary | ICD-10-CM

## 2012-06-19 LAB — CBC WITH DIFFERENTIAL/PLATELET
Basophils Absolute: 0 10*3/uL (ref 0.0–0.1)
HCT: 32.9 % — ABNORMAL LOW (ref 36.0–46.0)
Lymphs Abs: 1.2 10*3/uL (ref 0.7–4.0)
MCV: 89.8 fl (ref 78.0–100.0)
Monocytes Absolute: 0.5 10*3/uL (ref 0.1–1.0)
Neutro Abs: 2.5 10*3/uL (ref 1.4–7.7)
Platelets: 278 10*3/uL (ref 150.0–400.0)
RDW: 14.6 % (ref 11.5–14.6)

## 2012-06-19 LAB — LIPID PANEL
HDL: 42.2 mg/dL (ref >39.00)
Triglycerides: 284 mg/dL — ABNORMAL HIGH (ref 0.0–149.0)
VLDL: 56.8 mg/dL — ABNORMAL HIGH (ref 0.0–40.0)

## 2012-06-19 LAB — COMPREHENSIVE METABOLIC PANEL
Alkaline Phosphatase: 40 U/L (ref 39–117)
Creatinine, Ser: 1 mg/dL (ref 0.4–1.2)
Glucose, Bld: 166 mg/dL — ABNORMAL HIGH (ref 70–99)
Sodium: 139 mEq/L (ref 135–145)
Total Bilirubin: 0.2 mg/dL — ABNORMAL LOW (ref 0.3–1.2)
Total Protein: 7.7 g/dL (ref 6.0–8.3)

## 2012-06-19 LAB — HEMOGLOBIN A1C: Hgb A1c MFr Bld: 6.8 % — ABNORMAL HIGH (ref 4.6–6.5)

## 2012-06-20 LAB — LDL CHOLESTEROL, DIRECT: Direct LDL: 137.6 mg/dL

## 2012-06-26 ENCOUNTER — Ambulatory Visit: Payer: Medicare Other | Admitting: Family Medicine

## 2012-06-26 ENCOUNTER — Ambulatory Visit (INDEPENDENT_AMBULATORY_CARE_PROVIDER_SITE_OTHER): Payer: PRIVATE HEALTH INSURANCE | Admitting: Family Medicine

## 2012-06-26 ENCOUNTER — Encounter: Payer: Self-pay | Admitting: Family Medicine

## 2012-06-26 VITALS — BP 120/64 | HR 57 | Temp 98.0°F | Ht 64.0 in | Wt 181.0 lb

## 2012-06-26 DIAGNOSIS — E119 Type 2 diabetes mellitus without complications: Secondary | ICD-10-CM

## 2012-06-26 DIAGNOSIS — Z1211 Encounter for screening for malignant neoplasm of colon: Secondary | ICD-10-CM

## 2012-06-26 DIAGNOSIS — E039 Hypothyroidism, unspecified: Secondary | ICD-10-CM

## 2012-06-26 DIAGNOSIS — E7800 Pure hypercholesterolemia, unspecified: Secondary | ICD-10-CM

## 2012-06-26 DIAGNOSIS — I10 Essential (primary) hypertension: Secondary | ICD-10-CM

## 2012-06-26 DIAGNOSIS — D649 Anemia, unspecified: Secondary | ICD-10-CM

## 2012-06-26 DIAGNOSIS — E669 Obesity, unspecified: Secondary | ICD-10-CM

## 2012-06-26 MED ORDER — LEVOTHYROXINE SODIUM 100 MCG PO TABS: 100.0000 ug | ORAL_TABLET | Freq: Every day | ORAL | Status: DC

## 2012-06-26 NOTE — Patient Instructions (Addendum)
Your sugar level is up slightly -so watch sugar in your diet -eat less bread Keep up exercise Please do stool card for colon cancer screening  You declined colonoscopy today - and you are a bit anemic - so let me know if you change your mind about that You also declined mammogram for breast cancer screening - so let me know if you change your mind about that also Increase your thyroid medicine to 100 mcg daily  Schedule non fasting labs in 6 weeks for thyroid  Follow up with me in about 6 months  If you want me to re write your px for 90 day supplies- check with your insurance to make sure they will pay for that and let me know

## 2012-06-26 NOTE — Progress Notes (Signed)
Subjective:    Patient ID: Barbara [name], female    DOB: Apr 30, 1941, 71 y.o.   MRN: [id number]  HPI Here for check up of chronic medical conditions and to review health mt list   Is feeling good   bp goodToday BP Readings from Last 3 Encounters:  06/26/12 120/64  03/01/12 157/77  02/15/12 140/74    No cp or palpitations or headaches or edema  No side effects to medicines    Wt is down 10 lb with bmi of 31 Has been working on that  Diet-- has eaten less in general , is trying to stay away from sweets and starches  Does not eat a lot of fruit  Too much bread  working in garden and yard -more exercise  DM A1c is up to 6.8 from 6.4- small inc Last eye exam- was sept - no changes  On metformin  Lab Results  Component Value Date   CHOL 261* 06/19/2012   CHOL 285* 12/20/2011   CHOL 276* 07/19/2011   Lab Results  Component Value Date   HDL 42.20 06/19/2012   HDL 40.98 12/20/2011   HDL 11.91 07/19/2011   No results found for this basename: LDLCALC   Lab Results  Component Value Date   TRIG 284.0* 06/19/2012   TRIG 274.0* 12/20/2011   TRIG 268.0* 07/19/2011   Lab Results  Component Value Date   CHOLHDL 6 06/19/2012   CHOLHDL 6 12/20/2011   CHOLHDL 6 07/19/2011   Lab Results  Component Value Date   LDLDIRECT 137.6 06/19/2012   LDLDIRECT 174.1 12/20/2011   LDLDIRECT 164.1 07/19/2011   is on lopid for high trig  Trig inc a little, but LDL is down significantly    Mild anemia persists Lab Results  Component Value Date   WBC 4.6 06/19/2012   HGB 11.1* 06/19/2012   HCT 32.9* 06/19/2012   MCV 89.8 06/19/2012   PLT 278.0 06/19/2012   declined GI w/u in the past  colono cancer screen- she declines colonoscopy  Will do IFOB   Hypothyroid Lab Results  Component Value Date   TSH 13.42* 06/19/2012   Need to inc dose  Not tired or sluggish- feels fine  Never mises doses   Post menopausal bleed- is still happening every 3 months or so  Had gyn exam Saw Dr Marice Potter Reassuring [place]    Pt declined D and C or any proceedures  Given vaginal estogen cream for atrophic change (pt is not aware of this )   Has declined mammogram in the past  She declines this again  Self exam - denies breast lumps   Patient Active Problem List  Diagnosis  . DIABETES MELLITUS, TYPE II  . HYPERCHOLESTEROLEMIA  . ANXIETY  . EXPRESSIVE LANGUAGE DISORDER  . ISCHEMIA, RETINAL  . HYPERTENSION  . ALLERGIC RHINITIS  . INTERTRIGO, CANDIDAL  . ANGIOEDEMA  . Obesity (BMI 30-39.9)  . Anemia  . Post-menopausal bleeding  . Gynecological examination  . Hypothyroid  . Colon cancer screening   Past Medical History  Diagnosis Date  . Allergy   . Anxiety   . Diabetes mellitus   . Hypertension   . Hyperlipidemia   . GERD (gastroesophageal reflux disease)   . Expressive language disorder   . Retinal ischemia     history of  . Obesity   . Mentally disabled   . Learning disability    Past Surgical History  Procedure Date  . Cholecystectomy    History  Substance Use  Topics  . Smoking status: Never Smoker   . Smokeless tobacco: Not on file  . Alcohol Use: No   Family History  Problem Relation Age of Onset  . Heart disease Father   . Hypertension Father   . Heart disease Brother    Allergies  Allergen Reactions  . Atorvastatin     REACTION: increased liver fxn tests  . Ezetimibe     REACTION: stomach upset/malaise  . Sulfonamide Derivatives     REACTION: rash   Current Outpatient Prescriptions on File Prior to Visit  Medication Sig Dispense Refill  . acetaminophen (TYLENOL) 500 MG tablet Take 500 mg by mouth every 6 (six) hours as needed.        Marland Kitchen amLODipine (NORVASC) 10 MG tablet Take 1 tablet (10 mg total) by mouth daily.  30 tablet  11  . bisoprolol-hydrochlorothiazide (ZIAC) 10-6.25 MG per tablet Take 1 tablet by mouth 2 (two) times daily.  60 tablet  11  . fluticasone (FLONASE) 50 MCG/ACT nasal spray Place 2 sprays into the nose daily.  16 g  11  . gemfibrozil (LOPID)  600 MG tablet Take 1 tablet (600 mg total) by mouth 2 (two) times daily.  60 tablet  11  . Glucose Blood DISK by In Vitro route.        . metFORMIN (GLUCOPHAGE) 500 MG tablet Take 1 tablet (500 mg total) by mouth daily with breakfast.  30 tablet  11  . omeprazole (PRILOSEC) 20 MG capsule Take 1 capsule (20 mg total) by mouth daily before breakfast.  30 capsule  11  . quinapril (ACCUPRIL) 40 MG tablet Take 1 tablet (40 mg total) by mouth daily. 1 1/2 tablet by mouth daily  45 tablet  11  . sertraline (ZOLOFT) 50 MG tablet Take 1 tablet (50 mg total) by mouth daily.  30 tablet  11  . clotrimazole-betamethasone (LOTRISONE) cream Apply topically daily as needed.              Review of Systems Review of Systems  Constitutional: Negative for fever, appetite change, fatigue and unexpected weight change.  Eyes: Negative for pain and visual disturbance.  Respiratory: Negative for cough and shortness of breath.   Cardiovascular: Negative for cp or palpitations    Gastrointestinal: Negative for nausea, diarrhea and constipation.  Genitourinary: Negative for urgency and frequency.  Skin: Negative for pallor or rash   Neurological: Negative for weakness, light-headedness, numbness and headaches.  Hematological: Negative for adenopathy. Does not bruise/bleed easily.  Psychiatric/Behavioral: Negative for dysphoric mood. The patient is not nervous/anxious.         Objective:   Physical Exam  Constitutional: She appears well-developed and well-nourished.       Obese and well appearing   HENT:  Head: Normocephalic and atraumatic.  Right Ear: External ear normal.  Left Ear: External ear normal.  Nose: Nose normal.  Mouth/Throat: Oropharynx is clear and moist.  Eyes: Conjunctivae and EOM are normal. No scleral icterus.       No vision in R eye   Neck: Normal range of motion. Neck supple. No JVD present. Carotid bruit is not present. No thyromegaly present.  Cardiovascular: Normal rate, regular  rhythm, normal heart sounds and intact distal pulses.  Exam reveals no gallop.   Pulmonary/Chest: Effort normal and breath sounds normal. No respiratory distress. She has no wheezes.  Abdominal: Soft. Bowel sounds are normal. She exhibits no distension, no abdominal bruit and no mass. There is no tenderness.  Genitourinary: No breast swelling, tenderness, discharge or bleeding.       Breast exam: No mass, nodules, thickening, tenderness, bulging, retraction, inflamation, nipple discharge or skin changes noted.  No axillary or clavicular LA.  Chaperoned exam.    Musculoskeletal: She exhibits no edema and no tenderness.  Lymphadenopathy:    She has no cervical adenopathy.  Neurological: She is alert. She has normal reflexes. No cranial nerve deficit. She exhibits normal muscle tone. Coordination normal.  Skin: Skin is warm and dry. No rash noted. No erythema. No pallor.  Psychiatric: She has a normal mood and affect.       Baseline mild MR with speech impediment          Assessment & Plan:

## 2012-06-28 NOTE — Assessment & Plan Note (Signed)
tsh still high so dose inc to 100 mcg Lab in 6 wk and update Clinically stable

## 2012-06-28 NOTE — Assessment & Plan Note (Signed)
bp in fair control at this time  No changes needed  Disc lifstyle change with low sodium diet and exercise   Reviewed labs today 

## 2012-06-28 NOTE — Assessment & Plan Note (Signed)
Discussed how this problem influences overall health and the risks it imposes  Reviewed plan for weight loss with lower calorie diet (via better food choices and also portion control or program like weight watchers) and exercise building up to or more than 30 minutes 5 days per week including some aerobic activity    Disc imp of wt loss for DM

## 2012-06-28 NOTE — Assessment & Plan Note (Signed)
Mild and chronic and asymptomatic  Pt declines GI work up for this  Disc diet Will continue to follow

## 2012-06-28 NOTE — Assessment & Plan Note (Signed)
Pt declines colonosc but will do IFOB card Has stable chronic anemia

## 2012-06-28 NOTE — Assessment & Plan Note (Signed)
This is slt worse with a1c 6.8 Disc watching diet more closely for carbs and sugars  Disc inc exercise and fitting it into day to day routine with options Also stressed imp of wt loss  Will f/u 41mo

## 2012-06-28 NOTE — Assessment & Plan Note (Signed)
Lipids imp  On lopid and diet Disc goals for lipids and reasons to control them Rev labs with pt Rev low sat fat diet in detail

## 2012-12-05 HISTORY — PX: CATARACT EXTRACTION: SUR2

## 2013-01-01 ENCOUNTER — Encounter: Payer: Self-pay | Admitting: Family Medicine

## 2013-01-01 ENCOUNTER — Ambulatory Visit (INDEPENDENT_AMBULATORY_CARE_PROVIDER_SITE_OTHER): Payer: PRIVATE HEALTH INSURANCE | Admitting: Family Medicine

## 2013-01-01 VITALS — BP 134/82 | HR 62 | Temp 98.5°F | Ht 64.0 in | Wt 151.0 lb

## 2013-01-01 DIAGNOSIS — I10 Essential (primary) hypertension: Secondary | ICD-10-CM

## 2013-01-01 DIAGNOSIS — E119 Type 2 diabetes mellitus without complications: Secondary | ICD-10-CM

## 2013-01-01 DIAGNOSIS — E039 Hypothyroidism, unspecified: Secondary | ICD-10-CM

## 2013-01-01 DIAGNOSIS — D649 Anemia, unspecified: Secondary | ICD-10-CM

## 2013-01-01 DIAGNOSIS — E7800 Pure hypercholesterolemia, unspecified: Secondary | ICD-10-CM

## 2013-01-01 LAB — CBC WITH DIFFERENTIAL/PLATELET
Basophils Absolute: 0 10*3/uL (ref 0.0–0.1)
Eosinophils Absolute: 0.1 10*3/uL (ref 0.0–0.7)
Hemoglobin: 10.5 g/dL — ABNORMAL LOW (ref 12.0–15.0)
Lymphocytes Relative: 16.1 % (ref 12.0–46.0)
Lymphs Abs: 0.9 10*3/uL (ref 0.7–4.0)
MCHC: 34 g/dL (ref 30.0–36.0)
Monocytes Absolute: 0.4 10*3/uL (ref 0.1–1.0)
Neutro Abs: 4 10*3/uL (ref 1.4–7.7)
RDW: 13.4 % (ref 11.5–14.6)

## 2013-01-01 LAB — LIPID PANEL
Cholesterol: 201 mg/dL — ABNORMAL HIGH (ref 0–200)
HDL: 33.4 mg/dL — ABNORMAL LOW (ref >39.00)
Total CHOL/HDL Ratio: 6
Triglycerides: 233 mg/dL — ABNORMAL HIGH (ref 0.0–149.0)
VLDL: 46.6 mg/dL — ABNORMAL HIGH (ref 0.0–40.0)

## 2013-01-01 LAB — COMPREHENSIVE METABOLIC PANEL
ALT: 12 U/L (ref 0–35)
AST: 18 U/L (ref 0–37)
Alkaline Phosphatase: 59 U/L (ref 39–117)
Chloride: 103 mEq/L (ref 96–112)
Creatinine, Ser: 1 mg/dL (ref 0.4–1.2)
Total Bilirubin: 0.6 mg/dL (ref 0.3–1.2)

## 2013-01-01 MED ORDER — SERTRALINE HCL 50 MG PO TABS: 50.0000 mg | ORAL_TABLET | Freq: Every day | ORAL | Status: DC

## 2013-01-01 MED ORDER — OMEPRAZOLE 20 MG PO CPDR: 20.0000 mg | DELAYED_RELEASE_CAPSULE | Freq: Every day | ORAL | Status: DC

## 2013-01-01 MED ORDER — GEMFIBROZIL 600 MG PO TABS: 600.0000 mg | ORAL_TABLET | Freq: Two times a day (BID) | ORAL | Status: DC

## 2013-01-01 MED ORDER — FLUTICASONE PROPIONATE 50 MCG/ACT NA SUSP: 2.0000 | Freq: Every day | NASAL | Status: DC

## 2013-01-01 MED ORDER — AMLODIPINE BESYLATE 10 MG PO TABS: 10.0000 mg | ORAL_TABLET | Freq: Every day | ORAL | Status: DC

## 2013-01-01 MED ORDER — BISOPROLOL-HYDROCHLOROTHIAZIDE 10-6.25 MG PO TABS: 1.0000 | ORAL_TABLET | Freq: Two times a day (BID) | ORAL | Status: DC

## 2013-01-01 MED ORDER — METFORMIN HCL 500 MG PO TABS: 500.0000 mg | ORAL_TABLET | Freq: Every day | ORAL | Status: DC

## 2013-01-01 MED ORDER — QUINAPRIL HCL 40 MG PO TABS: 40.0000 mg | ORAL_TABLET | Freq: Every day | ORAL | Status: DC

## 2013-01-01 NOTE — Patient Instructions (Addendum)
Congratulations on weight loss and keep working on healthy diet and exercise  Labs today  Take care of yourself Follow up in 6 months with labs prior for annual exam  I am aware that you do not want to get mammograms- make sure you discuss that with [facility]

## 2013-01-01 NOTE — Assessment & Plan Note (Signed)
bp in fair control at this time  No changes needed  Disc lifstyle change with low sodium diet and exercise  Labs today Wt loss noted- intentional per pt

## 2013-01-01 NOTE — Assessment & Plan Note (Signed)
Expect imp in a1c with wt loss Disc diet 30 lb intentional wt loss with exercise also- commended opthy utd On ace

## 2013-01-01 NOTE — Assessment & Plan Note (Signed)
Overdue for tsh after adjustment  No symptoms 30 lb wt loss pt states is intentional

## 2013-01-01 NOTE — Progress Notes (Signed)
Subjective:    Patient ID: Barbara [name], female    DOB: Dec 01, 1941, 72 y.o.   MRN: [id number]  HPI Here for f/u of chronic medical problems   Wt is down 30 lb with bmi of 25 Thinks she has lost it through exercise -riding a bike Is also eating less - has cut her portions   Due for labs  Diabetes  DM diet - doing very well with that Exercise - much better also Symptoms-none  A1C last  Lab Results  Component Value Date   HGBA1C 6.8* 06/19/2012    No problems with medications -metformin Renal protection- is on ace  Last eye exam -next visit is later this month   bp is stable today  No cp or palpitations or headaches or edema  No side effects to medicines  BP Readings from Last 3 Encounters:  01/01/13 134/82  06/26/12 120/64  03/01/12 157/77     Hx of anemia Lab Results  Component Value Date   WBC 4.6 06/19/2012   HGB 11.1* 06/19/2012   HCT 32.9* 06/19/2012   MCV 89.8 06/19/2012   PLT 278.0 06/19/2012   Pt declined gi work up   Hyperlipidemia Lab Results  Component Value Date   CHOL 261* 06/19/2012   HDL 42.20 06/19/2012   LDLDIRECT 137.6 06/19/2012   TRIG 284.0* 06/19/2012   CHOLHDL 6 06/19/2012    Hypothyroid Lab Results  Component Value Date   TSH 13.42* 06/19/2012  had adjusted dose  Feels good -no symptoms at all on increased dose  Overdue for check    Review of Systems Review of Systems  Constitutional: Negative for fever, appetite change, fatigue and unexpected weight change.  Eyes: Negative for pain and visual disturbance.  Respiratory: Negative for cough and shortness of breath.   Cardiovascular: Negative for cp or palpitations    Gastrointestinal: Negative for nausea, diarrhea and constipation.  Genitourinary: Negative for urgency and frequency.  Skin: Negative for pallor or rash   Neurological: Negative for weakness, light-headedness, numbness and headaches.  Hematological: Negative for adenopathy. Does not bruise/bleed easily.    Psychiatric/Behavioral: Negative for dysphoric mood. The patient is not nervous/anxious.         Objective:   Physical Exam  Constitutional: She appears well-developed and well-nourished. No distress.  HENT:  Head: Normocephalic and atraumatic.  Mouth/Throat: Oropharynx is clear and moist.  Eyes: Conjunctivae normal and EOM are normal. Pupils are equal, round, and reactive to light. Right eye exhibits no discharge. Left eye exhibits no discharge. No scleral icterus.       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 Neck supple. No JVD present. Carotid bruit is not present. No thyromegaly present.  Cardiovascular: Normal rate, regular rhythm, normal heart sounds and intact distal pulses.  Exam reveals no gallop.   Pulmonary/Chest: Effort normal and breath sounds normal. No respiratory distress. She has no wheezes.  Abdominal: Soft. Bowel sounds are normal. She exhibits no distension, no abdominal bruit and no mass. There is no tenderness.  Musculoskeletal: She exhibits no edema.  Lymphadenopathy:    She has no cervical adenopathy.  Neurological: She is alert. She has normal reflexes. No cranial nerve deficit. She exhibits normal muscle tone. Coordination normal.  Skin: Skin is warm and dry. No rash noted. No erythema. No pallor.  Psychiatric: She has a normal mood and affect.       Baseline speech impediment and learning /intellectual delay Very pleasant and upbeat today  Assessment & Plan:

## 2013-01-07 ENCOUNTER — Telehealth: Payer: Self-pay | Admitting: Family Medicine

## 2013-01-07 DIAGNOSIS — Z1211 Encounter for screening for malignant neoplasm of colon: Secondary | ICD-10-CM

## 2013-01-07 DIAGNOSIS — D649 Anemia, unspecified: Secondary | ICD-10-CM

## 2013-01-07 NOTE — Telephone Encounter (Signed)
Putting ref to GI in at pt's guardian's request

## 2013-01-07 NOTE — Telephone Encounter (Signed)
Message copied by Judy Pimple on Mon Jan 07, 2013 10:12 PM ------      Message from: Shon Millet      Created: Mon Jan 07, 2013  4:58 PM       Spoke with Aniceto Boss (pt's sister) and notified her of lab results she did say pt has been working hard on losing weight so most of the weight loss is probably intentional but her sister does state that pt's appetite is really bad and she hasn't been eating a lot at meal time. Her sister agrees with referral to GI and said she will go with pt and make sure she sees the GI, please put referral in            ##Marion please call sister Aniceto Boss (emergency contact) on cell # listed to set up referral##

## 2013-01-10 ENCOUNTER — Encounter: Payer: Self-pay | Admitting: Internal Medicine

## 2013-02-04 ENCOUNTER — Other Ambulatory Visit (INDEPENDENT_AMBULATORY_CARE_PROVIDER_SITE_OTHER): Payer: PRIVATE HEALTH INSURANCE

## 2013-02-04 ENCOUNTER — Ambulatory Visit (INDEPENDENT_AMBULATORY_CARE_PROVIDER_SITE_OTHER): Payer: PRIVATE HEALTH INSURANCE | Admitting: Internal Medicine

## 2013-02-04 ENCOUNTER — Encounter: Payer: Self-pay | Admitting: Internal Medicine

## 2013-02-04 VITALS — BP 122/60 | HR 64 | Ht 64.17 in | Wt 151.2 lb

## 2013-02-04 DIAGNOSIS — D649 Anemia, unspecified: Secondary | ICD-10-CM

## 2013-02-04 DIAGNOSIS — R634 Abnormal weight loss: Secondary | ICD-10-CM

## 2013-02-04 LAB — VITAMIN B12: Vitamin B-12: 267 pg/mL (ref 211–911)

## 2013-02-04 LAB — FERRITIN: Ferritin: 58.7 ng/mL (ref 10.0–291.0)

## 2013-02-04 MED ORDER — SOD PICOSULFATE-MAG OX-CIT ACD 10-3.5-12 MG-GM-GM PO PACK: 1.0000 | PACK | Freq: Once | ORAL | Status: DC

## 2013-02-04 NOTE — Progress Notes (Signed)
Quick Note:  Will discuss at colonoscopy May benefit from oral B12 ______

## 2013-02-04 NOTE — Patient Instructions (Addendum)
You have been scheduled for an endoscopy and colonoscopy with propofol. Please follow the written instructions given to you at your visit today. Please use the prepopik kit you have been given today. If you use inhalers (even only as needed) or a CPAP machine, please bring them with you on the day of your procedure.  Your physician has requested that you go to the basement for the following lab work before leaving today: B12 level, Ferritin  Thank you for choosing me and [facility].  Iva Boop, M.D., [facility]

## 2013-02-04 NOTE — Progress Notes (Signed)
Subjective:    Patient ID: Barbara [name], female    DOB: 09/04/1941, 72 y.o.   MRN: [id number]  HPI This nice lady is here with her sister and guardian for evaluation of anemia and she also has weight loss. Has exercised some and thinks she lost weight that way but also complains of intermittent anorexia and early satiety. Able to give good hx overall but has mental retardation. Lives alone, near sister. Has chronic, intermittent, loose stools that occur when she feels stressed. No bleeding. Had declined colonoscopy in past but sister has convinced her it is needed.   Wt Readings from Last 3 Encounters:  02/04/13 151 lb 4 oz (68.607 kg)  01/01/13 151 lb (68.493 kg)  06/26/12 181 lb (82.101 kg)   Allergies  Allergen Reactions  . Atorvastatin     REACTION: increased liver fxn tests  . Ezetimibe     REACTION: stomach upset/malaise  . Sulfonamide Derivatives     REACTION: rash   Outpatient Prescriptions Prior to Visit  Medication Sig Dispense Refill  . acetaminophen (TYLENOL) 500 MG tablet Take 500 mg by mouth every 6 (six) hours as needed.        Marland Kitchen amLODipine (NORVASC) 10 MG tablet Take 1 tablet (10 mg total) by mouth daily.  30 tablet  11  . bisoprolol-hydrochlorothiazide (ZIAC) 10-6.25 MG per tablet Take 1 tablet by mouth 2 (two) times daily.  60 tablet  11  . clotrimazole-betamethasone (LOTRISONE) cream Apply topically daily as needed.        . fexofenadine (ALLEGRA) 180 MG tablet Take 180 mg by mouth as needed.      . fluticasone (FLONASE) 50 MCG/ACT nasal spray Place 2 sprays into the nose daily.  16 g  11  . gemfibrozil (LOPID) 600 MG tablet Take 1 tablet (600 mg total) by mouth 2 (two) times daily.  60 tablet  11  . Glucose Blood DISK by In Vitro route.        Marland Kitchen levothyroxine (SYNTHROID, LEVOTHROID) 100 MCG tablet Take 1 tablet (100 mcg total) by mouth daily.  30 tablet  11  . LUMIGAN 0.01 % SOLN Place 1 drop into the left eye daily.       Marland Kitchen omeprazole (PRILOSEC) 20 MG capsule  Take 1 capsule (20 mg total) by mouth daily before breakfast.  30 capsule  11  . quinapril (ACCUPRIL) 40 MG tablet Take 1 tablet (40 mg total) by mouth daily. 1 1/2 tablet by mouth daily  45 tablet  11  . sertraline (ZOLOFT) 50 MG tablet Take 1 tablet (50 mg total) by mouth daily.  30 tablet  11  . metFORMIN (GLUCOPHAGE) 500 MG tablet Take 1 tablet (500 mg total) by mouth daily with breakfast.  30 tablet  11   No facility-administered medications prior to visit.   Past Medical History  Diagnosis Date  . Allergy   . Anxiety   . Diabetes mellitus   . Hypertension   . Hyperlipidemia   . GERD (gastroesophageal reflux disease)   . Expressive language disorder   . Retinal ischemia     history of  . Obesity   . Mentally disabled   . Learning disability   . Anemia    Past Surgical History  Procedure Laterality Date  . Cholecystectomy     History   Social History  . Marital Status: Divorced    Spouse Name: N/A    Number of Children: 0  . Years of Education: 9th grade  Occupational History  . disabled    Social History Main Topics  . Smoking status: Never Smoker   . Smokeless tobacco: Never Used  . Alcohol Use: No  . Drug Use: No  .            Social History Narrative   Ex-Husband has pschizoaffective disorder, family close by/ lot of support, works in garden   Disabled - mental retardation   Sister [name] is guardian   Family History  Problem Relation Age of Onset  . Heart disease Father   . Hypertension Father   . Heart disease Brother   . Diabetes Brother     Review of Systems Recent facial rash, resolved Blind right eye All other ROS negative or as per HPI     Objective:   Physical Exam General:  Well-developed, well-nourished and in no acute distress Eyes:  Anicteric.Scar in right lens/cornea ENT:   Mouth and posterior pharynx free of lesions. + dentures Neck:   supple w/o thyromegaly or mass.  Lungs: Clear to auscultation bilaterally. Heart:   S1S2, no rubs, murmurs, gallops. Abdomen:  soft, non-tender, no hepatosplenomegaly, hernia, or mass and BS+.  Rectal: deferred Lymph:  no cervical or supraclavicular adenopathy. Extremities:   no edema Skin   no rash. Neuro:  A&O - speech slightly slurred Psych:  appropriate mood and affect.   Data Reviewed: Lab Results  Component Value Date   WBC 5.4 01/01/2013   HGB 10.5* 01/01/2013   HCT 30.8* 01/01/2013   MCV 89.3 01/01/2013   PLT 275.0 01/01/2013     Chemistry      Component Value Date/Time   NA 137 01/01/2013 1147   K 3.8 01/01/2013 1147   CL 103 01/01/2013 1147   CO2 23 01/01/2013 1147   BUN 33* 01/01/2013 1147   CREATININE 1.0 01/01/2013 1147      Component Value Date/Time   CALCIUM 9.9 01/01/2013 1147   ALKPHOS 59 01/01/2013 1147   AST 18 01/01/2013 1147   ALT 12 01/01/2013 1147   BILITOT 0.6 01/01/2013 1147     No results found for this basename: FERRITIN   No results found for this basename: VITAMINB12   Lab Results  Component Value Date   TSH 2.18 01/01/2013      Assessment & Plan:  Anemia - cause not clear , it is normocytic so ? Chronic dz  Loss of weight - cause not clear , sounds like she does have some early satiety   1. Ferritin and B12 levels 2. EGD and colonoscopy (Prep-o-Pik) to investigate the weight loss, anemia and early satiety The risks and benefits as well as alternatives of endoscopic procedure(s) have been discussed and reviewed. All questions answered. The patient and her sister (guardian) agrees to proceed.  I appreciate the opportunity to care for this patient.  WU:[id number] Tower, MD

## 2013-02-07 ENCOUNTER — Ambulatory Visit (AMBULATORY_SURGERY_CENTER): Payer: PRIVATE HEALTH INSURANCE | Admitting: Internal Medicine

## 2013-02-07 ENCOUNTER — Encounter: Payer: Self-pay | Admitting: Internal Medicine

## 2013-02-07 VITALS — BP 128/66 | HR 58 | Temp 99.3°F | Resp 24 | Ht 64.0 in | Wt 151.0 lb

## 2013-02-07 DIAGNOSIS — D126 Benign neoplasm of colon, unspecified: Secondary | ICD-10-CM

## 2013-02-07 DIAGNOSIS — D649 Anemia, unspecified: Secondary | ICD-10-CM

## 2013-02-07 DIAGNOSIS — R634 Abnormal weight loss: Secondary | ICD-10-CM

## 2013-02-07 MED ORDER — SODIUM CHLORIDE 0.9 % IV SOLN: 500.0000 mL | INTRAVENOUS | Status: DC

## 2013-02-07 MED ORDER — FERROUS SULFATE 325 (65 FE) MG PO TABS: 325.0000 mg | ORAL_TABLET | Freq: Every day | ORAL | Status: AC

## 2013-02-07 MED ORDER — VITAMIN B-12 1000 MCG PO TABS: 1000.0000 ug | ORAL_TABLET | Freq: Every day | ORAL | Status: AC

## 2013-02-07 NOTE — Progress Notes (Signed)
Called to room to assist during endoscopic procedure.  Patient ID and intended procedure confirmed with present staff. Received instructions for my participation in the procedure from the performing physician.  

## 2013-02-07 NOTE — Op Note (Signed)
[facility] [address] [place], [address]   COLONOSCOPY PROCEDURE REPORT  PATIENT: [name], Barbara  MR#: [id number] BIRTHDATE: [birth year] , 71  yrs. old GENDER: Female ENDOSCOPIST: Iva Boop, MD, [facility] REFERRED XB:JYNWG Fransisca Connors, M.D. PROCEDURE DATE:  02/07/2013 PROCEDURE:   Colonoscopy with biopsy ASA CLASS:   Class II INDICATIONS:Anemia, non-specific and Weight loss. MEDICATIONS: There was residual sedation effect present from prior procedure, propofol (Diprivan) 100mg  IV, MAC sedation, administered by CRNA, and These medications were titrated to patient response per physician's verbal order  DESCRIPTION OF PROCEDURE:   After the risks benefits and alternatives of the procedure were thoroughly explained, informed consent was obtained.  A digital rectal exam revealed no abnormalities of the rectum.   The LB CF-[id number] [id number]  endoscope was introduced through the anus and advanced to the cecum, which was identified by both the appendix and ileocecal valve. No adverse events experienced.   The quality of the prep was Suprep good  The instrument was then slowly withdrawn as the colon was fully examined.      COLON FINDINGS: A diminutive polypoid shaped sessile polyp was found in the descending colon.  A polypectomy was performed with cold forceps.  The resection was complete and the polyp tissue was completely retrieved.   The colon mucosa was otherwise normal. Retroflexed views revealed no abnormalities. The time to cecum=2 minutes 43 seconds.  Withdrawal time=7 minutes 30 seconds.  The scope was withdrawn and the procedure completed. COMPLICATIONS: There were no complications.  ENDOSCOPIC IMPRESSION: 1.   Diminutive sessile polyp was found in the descending colon; polypectomy was performed with cold forceps 2.   The colon mucosa was otherwise normal - good prep - no causes of weight loss or anemia on EGD or this colonoscopy  RECOMMENDATIONS: 1.   Timing of repeat colonoscopy will be determined by pathology findings. 2.   start B12 and ferrous sulfate as both levels low normal - follow-up Dr.  Milinda Antis by May   eSigned:  Iva Boop, MD, [facility] 02/07/2013 4:17 PM   cc: Judy Pimple, MD and The Patient

## 2013-02-07 NOTE — Op Note (Signed)
[facility] [address] [place], [address]   ENDOSCOPY PROCEDURE REPORT  PATIENT: Barbara [name], Barbara [name]  MR#: [id number] BIRTHDATE: [birth year] , 71  yrs. old GENDER: Female ENDOSCOPIST: Iva Boop, MD, [facility] REFERRED BY:  Judy Pimple, M.D. PROCEDURE DATE:  02/07/2013 PROCEDURE:  EGD, diagnostic ASA CLASS:     Class II INDICATIONS:  Anemia.   Weight loss. MEDICATIONS: Propofol (Diprivan) 70 mg IV, MAC sedation, administered by CRNA, and These medications were titrated to patient response per physician's verbal order TOPICAL ANESTHETIC: Cetacaine Spray  DESCRIPTION OF PROCEDURE: After the risks benefits and alternatives of the procedure were thoroughly explained, informed consent was obtained.  The LB GIF-H180 [id number] endoscope was introduced through the mouth and advanced to the second portion of the duodenum. Without limitations.  The instrument was slowly withdrawn as the mucosa was fully examined.      The upper, middle and distal third of the esophagus were carefully inspected and no abnormalities were noted.  The z-line was well seen at the GEJ.  The endoscope was pushed into the fundus which was normal including a retroflexed view.  The antrum, gastric body, first and second part of the duodenum were unremarkable. Retroflexed views revealed no abnormalities.     The scope was then withdrawn from the patient and the procedure completed.  COMPLICATIONS: There were no complications. ENDOSCOPIC IMPRESSION: Normal EGD  RECOMMENDATIONS: Proceed with a Colonoscopy.   eSigned:  Iva Boop, MD, [facility] 02/07/2013 4:13 PM   XB:JYNWG Fransisca Connors, MD and The Patient

## 2013-02-07 NOTE — Progress Notes (Signed)
Patient did not experience any of the following events: a burn prior to discharge; a fall within the facility; wrong site/side/patient/procedure/implant event; or a hospital transfer or hospital admission upon discharge from the facility. (G8907) Patient did not have preoperative order for IV antibiotic SSI prophylaxis. (G8918)  

## 2013-02-07 NOTE — Patient Instructions (Addendum)
The upper endoscopy exam was normal.  One tiny polyp was removed from the colon - not a problem.  The iron level is ok but on low side and so is your vitamin B12 level.  Please start taking vitamin B12 and ferrous sulfate (on your medication list now - are over the counter) daily and follow-up with Dr. Milinda Antis about your anemia and weight, I recommend you see her in May.  Thank you for choosing me and [facility].  Iva Boop, MD, FACG  YOU HAD AN ENDOSCOPIC PROCEDURE TODAY AT THE [facility]: Refer to the procedure report that was given to you for any specific questions about what was found during the examination.  If the procedure report does not answer your questions, please call your gastroenterologist to clarify.  If you requested that your care partner not be given the details of your procedure findings, then the procedure report has been included in a sealed envelope for you to review at your convenience later.  YOU SHOULD EXPECT: Some feelings of bloating in the abdomen. Passage of more gas than usual.  Walking can help get rid of the air that was put into your GI tract during the procedure and reduce the bloating. If you had a lower endoscopy (such as a colonoscopy or flexible sigmoidoscopy) you may notice spotting of blood in your stool or on the toilet paper. If you underwent a bowel prep for your procedure, then you may not have a normal bowel movement for a few days.  DIET: Your first meal following the procedure should be a light meal and then it is ok to progress to your normal diet.  A half-sandwich or bowl of soup is an example of a good first meal.  Heavy or fried foods are harder to digest and may make you feel nauseous or bloated.  Likewise meals heavy in dairy and vegetables can cause extra gas to form and this can also increase the bloating.  Drink plenty of fluids but you should avoid alcoholic beverages for 24 hours.  ACTIVITY: Your care partner  should take you home directly after the procedure.  You should plan to take it easy, moving slowly for the rest of the day.  You can resume normal activity the day after the procedure however you should NOT DRIVE or use heavy machinery for 24 hours (because of the sedation medicines used during the test).    SYMPTOMS TO REPORT IMMEDIATELY: A gastroenterologist can be reached at any hour.  During normal business hours, 8:30 AM to 5:00 PM Monday through Friday, call [phone].  After hours and on weekends, please call the GI answering service at [phone] who will take a message and have the physician on call contact you.   Following lower endoscopy (colonoscopy or flexible sigmoidoscopy):  Excessive amounts of blood in the stool  Significant tenderness or worsening of abdominal pains  Swelling of the abdomen that is new, acute  Fever of 100F or higher  Following upper endoscopy (EGD)  Vomiting of blood or coffee ground material  New chest pain or pain under the shoulder blades  Painful or persistently difficult swallowing  New shortness of breath  Fever of 100F or higher  Black, tarry-looking stools  FOLLOW UP: If any biopsies were taken you will be contacted by phone or by letter within the next 1-3 weeks.  Call your gastroenterologist if you have not heard about the biopsies in 3 weeks.  Our staff will call  the home number listed on your records the next business day following your procedure to check on you and address any questions or concerns that you may have at that time regarding the information given to you following your procedure. This is a courtesy call and so if there is no answer at the home number and we have not heard from you through the emergency physician on call, we will assume that you have returned to your regular daily activities without incident.  SIGNATURES/CONFIDENTIALITY: You and/or your care partner have signed paperwork which will be entered into your  electronic medical record.  These signatures attest to the fact that that the information above on your After Visit Summary has been reviewed and is understood.  Full responsibility of the confidentiality of this discharge information lies with you and/or your care-partner.  Polyp-handout given

## 2013-02-08 ENCOUNTER — Telehealth: Payer: Self-pay

## 2013-02-08 NOTE — Telephone Encounter (Signed)
Line busy

## 2013-02-13 ENCOUNTER — Encounter: Payer: Self-pay | Admitting: Internal Medicine

## 2013-02-13 NOTE — Progress Notes (Signed)
Quick Note:  Not a polyp -  No routine repeat colonoscopy recommended ______

## 2013-05-08 ENCOUNTER — Ambulatory Visit (INDEPENDENT_AMBULATORY_CARE_PROVIDER_SITE_OTHER): Payer: PRIVATE HEALTH INSURANCE | Admitting: Family Medicine

## 2013-05-08 ENCOUNTER — Encounter: Payer: Self-pay | Admitting: Family Medicine

## 2013-05-08 VITALS — BP 124/68 | HR 50 | Temp 98.5°F | Ht 64.0 in | Wt 147.0 lb

## 2013-05-08 DIAGNOSIS — E039 Hypothyroidism, unspecified: Secondary | ICD-10-CM

## 2013-05-08 DIAGNOSIS — E119 Type 2 diabetes mellitus without complications: Secondary | ICD-10-CM

## 2013-05-08 DIAGNOSIS — D649 Anemia, unspecified: Secondary | ICD-10-CM

## 2013-05-08 LAB — VITAMIN B12: Vitamin B-12: 439 pg/mL (ref 211–911)

## 2013-05-08 LAB — CBC WITH DIFFERENTIAL/PLATELET
Basophils Absolute: 0 10*3/uL (ref 0.0–0.1)
Eosinophils Relative: 2 % (ref 0.0–5.0)
HCT: 31.6 % — ABNORMAL LOW (ref 36.0–46.0)
Lymphocytes Relative: 26.4 % (ref 12.0–46.0)
Lymphs Abs: 1.6 10*3/uL (ref 0.7–4.0)
Monocytes Relative: 10 % (ref 3.0–12.0)
Platelets: 286 10*3/uL (ref 150.0–400.0)
RDW: 16.1 % — ABNORMAL HIGH (ref 11.5–14.6)
WBC: 6.1 10*3/uL (ref 4.5–10.5)

## 2013-05-08 LAB — COMPREHENSIVE METABOLIC PANEL
ALT: 10 U/L (ref 0–35)
Albumin: 4 g/dL (ref 3.5–5.2)
CO2: 20 mEq/L (ref 19–32)
Calcium: 10.1 mg/dL (ref 8.4–10.5)
Chloride: 107 mEq/L (ref 96–112)
GFR: 68.17 mL/min (ref >60.00)
Glucose, Bld: 110 mg/dL — ABNORMAL HIGH (ref 70–99)
Potassium: 5.2 mEq/L — ABNORMAL HIGH (ref 3.5–5.1)
Sodium: 141 mEq/L (ref 135–145)
Total Bilirubin: 0.5 mg/dL (ref 0.3–1.2)
Total Protein: 7.5 g/dL (ref 6.0–8.3)

## 2013-05-08 LAB — HEMOGLOBIN A1C: Hgb A1c MFr Bld: 6 % (ref 4.6–6.5)

## 2013-05-08 NOTE — Assessment & Plan Note (Signed)
a1c today and cmet Good diet

## 2013-05-08 NOTE — Patient Instructions (Addendum)
Take care of yourself  Labs today for anemia and thyroid and sugar  Stay active  We will update you when labs return Continue the iron and the vitamin B 12

## 2013-05-08 NOTE — Progress Notes (Signed)
Subjective:    Patient ID: Barbara [name], female    DOB: October 04, 1941, 72 y.o.   MRN: [id number]  HPI Here for f/u of anemia  Lab Results  Component Value Date   WBC 5.4 01/01/2013   HGB 10.5* 01/01/2013   HCT 30.8* 01/01/2013   MCV 89.3 01/01/2013   PLT 275.0 01/01/2013   ferritin 58.7 Vit B12 267  Is on B12 pill and iron pill Found no source of bleeding   Wt is down 4 lb more - also had wt loss to begin with  She eats healthy - and she stays away from sweets   Had colonosc and EGD since last visit  All was neg incl bx She tolerated both of these tests fine   Patient Active Problem List   Diagnosis Date Noted  . Colon cancer screening 06/26/2012  . Hypothyroid 06/18/2012  . Gynecological examination 02/15/2012  . Obesity (BMI 30-39.9) 12/26/2011  . Anemia 12/26/2011  . Post-menopausal bleeding 12/26/2011  . INTERTRIGO, CANDIDAL 06/01/2010  . DIABETES MELLITUS, TYPE II 06/05/2007  . HYPERCHOLESTEROLEMIA 06/05/2007  . ANXIETY 06/05/2007  . EXPRESSIVE LANGUAGE DISORDER 06/05/2007  . ISCHEMIA, RETINAL 06/05/2007  . HYPERTENSION 06/05/2007  . ALLERGIC RHINITIS 06/05/2007  . ANGIOEDEMA 06/05/2007   Past Medical History  Diagnosis Date  . Allergy   . Anxiety   . Diabetes mellitus   . Hypertension   . Hyperlipidemia   . GERD (gastroesophageal reflux disease)   . Expressive language disorder   . Retinal ischemia     history of  . Obesity   . Mentally disabled   . Learning disability   . Anemia    Past Surgical History  Procedure Laterality Date  . Cholecystectomy     History  Substance Use Topics  . Smoking status: Never Smoker   . Smokeless tobacco: Never Used  . Alcohol Use: No   Family History  Problem Relation Age of Onset  . Heart disease Father   . Hypertension Father   . Heart disease Brother   . Diabetes Brother    Allergies  Allergen Reactions  . Atorvastatin     REACTION: increased liver fxn tests  . Ezetimibe     REACTION: stomach  upset/malaise  . Sulfonamide Derivatives     REACTION: rash   Current Outpatient Prescriptions on File Prior to Visit  Medication Sig Dispense Refill  . acetaminophen (TYLENOL) 500 MG tablet Take 500 mg by mouth every 6 (six) hours as needed.        Marland Kitchen amLODipine (NORVASC) 10 MG tablet Take 1 tablet (10 mg total) by mouth daily.  30 tablet  11  . bisoprolol-hydrochlorothiazide (ZIAC) 10-6.25 MG per tablet Take 1 tablet by mouth 2 (two) times daily.  60 tablet  11  . clotrimazole-betamethasone (LOTRISONE) cream Apply topically daily as needed.        . ferrous sulfate 325 (65 FE) MG tablet Take 1 tablet (325 mg total) by mouth daily with breakfast.    3  . fexofenadine (ALLEGRA) 180 MG tablet Take 180 mg by mouth as needed.      . fluticasone (FLONASE) 50 MCG/ACT nasal spray Place 2 sprays into the nose daily.  16 g  11  . gemfibrozil (LOPID) 600 MG tablet Take 1 tablet (600 mg total) by mouth 2 (two) times daily.  60 tablet  11  . Glucose Blood DISK by In Vitro route.        Marland Kitchen levothyroxine (SYNTHROID, LEVOTHROID) 100 MCG tablet  Take 1 tablet (100 mcg total) by mouth daily.  30 tablet  11  . LUMIGAN 0.01 % SOLN Place 1 drop into the left eye daily.       . metFORMIN (GLUCOPHAGE) 500 MG tablet Take 1 tablet (500 mg total) by mouth daily with breakfast.  30 tablet  11  . omeprazole (PRILOSEC) 20 MG capsule Take 1 capsule (20 mg total) by mouth daily before breakfast.  30 capsule  11  . quinapril (ACCUPRIL) 40 MG tablet Take 1 tablet (40 mg total) by mouth daily. 1 1/2 tablet by mouth daily  45 tablet  11  . sertraline (ZOLOFT) 50 MG tablet Take 1 tablet (50 mg total) by mouth daily.  30 tablet  11  . vitamin B-12 (CYANOCOBALAMIN) 1000 MCG tablet Take 1 tablet (1,000 mcg total) by mouth daily.       No current facility-administered medications on file prior to visit.      Review of Systems Patient Active Problem List   Diagnosis Date Noted  . Colon cancer screening 06/26/2012  .  Hypothyroid 06/18/2012  . Gynecological examination 02/15/2012  . Obesity (BMI 30-39.9) 12/26/2011  . Anemia 12/26/2011  . Post-menopausal bleeding 12/26/2011  . INTERTRIGO, CANDIDAL 06/01/2010  . DIABETES MELLITUS, TYPE II 06/05/2007  . HYPERCHOLESTEROLEMIA 06/05/2007  . ANXIETY 06/05/2007  . EXPRESSIVE LANGUAGE DISORDER 06/05/2007  . ISCHEMIA, RETINAL 06/05/2007  . HYPERTENSION 06/05/2007  . ALLERGIC RHINITIS 06/05/2007  . ANGIOEDEMA 06/05/2007   Past Medical History  Diagnosis Date  . Allergy   . Anxiety   . Diabetes mellitus   . Hypertension   . Hyperlipidemia   . GERD (gastroesophageal reflux disease)   . Expressive language disorder   . Retinal ischemia     history of  . Obesity   . Mentally disabled   . Learning disability   . Anemia    Past Surgical History  Procedure Laterality Date  . Cholecystectomy     History  Substance Use Topics  . Smoking status: Never Smoker   . Smokeless tobacco: Never Used  . Alcohol Use: No   Family History  Problem Relation Age of Onset  . Heart disease Father   . Hypertension Father   . Heart disease Brother   . Diabetes Brother    Allergies  Allergen Reactions  . Atorvastatin     REACTION: increased liver fxn tests  . Ezetimibe     REACTION: stomach upset/malaise  . Sulfonamide Derivatives     REACTION: rash   Current Outpatient Prescriptions on File Prior to Visit  Medication Sig Dispense Refill  . acetaminophen (TYLENOL) 500 MG tablet Take 500 mg by mouth every 6 (six) hours as needed.        Marland Kitchen amLODipine (NORVASC) 10 MG tablet Take 1 tablet (10 mg total) by mouth daily.  30 tablet  11  . bisoprolol-hydrochlorothiazide (ZIAC) 10-6.25 MG per tablet Take 1 tablet by mouth 2 (two) times daily.  60 tablet  11  . clotrimazole-betamethasone (LOTRISONE) cream Apply topically daily as needed.        . ferrous sulfate 325 (65 FE) MG tablet Take 1 tablet (325 mg total) by mouth daily with breakfast.    3  . fexofenadine  (ALLEGRA) 180 MG tablet Take 180 mg by mouth as needed.      . fluticasone (FLONASE) 50 MCG/ACT nasal spray Place 2 sprays into the nose daily.  16 g  11  . gemfibrozil (LOPID) 600 MG tablet Take 1  tablet (600 mg total) by mouth 2 (two) times daily.  60 tablet  11  . Glucose Blood DISK by In Vitro route.        Marland Kitchen LUMIGAN 0.01 % SOLN Place 1 drop into the left eye daily.       . metFORMIN (GLUCOPHAGE) 500 MG tablet Take 1 tablet (500 mg total) by mouth daily with breakfast.  30 tablet  11  . omeprazole (PRILOSEC) 20 MG capsule Take 1 capsule (20 mg total) by mouth daily before breakfast.  30 capsule  11  . quinapril (ACCUPRIL) 40 MG tablet Take 1 tablet (40 mg total) by mouth daily. 1 1/2 tablet by mouth daily  45 tablet  11  . sertraline (ZOLOFT) 50 MG tablet Take 1 tablet (50 mg total) by mouth daily.  30 tablet  11  . vitamin B-12 (CYANOCOBALAMIN) 1000 MCG tablet Take 1 tablet (1,000 mcg total) by mouth daily.       No current facility-administered medications on file prior to visit.       Objective:   Physical Exam  Constitutional: She appears well-developed and well-nourished. No distress.  Wt loss noted Very well appearing  HENT:  Head: Normocephalic and atraumatic.  Right Ear: External ear normal.  Left Ear: External ear normal.  Mouth/Throat: Oropharynx is clear and moist.  Eyes: Conjunctivae and EOM are normal. No scleral icterus.  Baseline pupil changes on R  Neck: Normal range of motion. Neck supple. No JVD present. Carotid bruit is not present. No thyromegaly present.  Cardiovascular: Normal rate, regular rhythm and normal heart sounds.  Exam reveals no gallop.   Pulmonary/Chest: Effort normal and breath sounds normal. No respiratory distress. She has no wheezes.  Abdominal: Soft. Bowel sounds are normal. She exhibits no distension, no abdominal bruit and no mass. There is no tenderness.  Musculoskeletal: She exhibits no edema and no tenderness.  Lymphadenopathy:    She has  no cervical adenopathy.  Neurological: She is alert. She has normal reflexes. No cranial nerve deficit. She exhibits normal muscle tone. Coordination normal.  Skin: Skin is warm and dry. No rash noted. No erythema. No pallor.  Psychiatric: She has a normal mood and affect.  Baseline speech imediment          Assessment & Plan:

## 2013-05-08 NOTE — Assessment & Plan Note (Signed)
Clinically stable but pt c/o hand tremor at times tsh today

## 2013-05-08 NOTE — Assessment & Plan Note (Signed)
Clear EGD and Colonoscopy  No longer vaginal bleeding -was worked up On ferrous sulfate and B12 Lab today

## 2013-05-09 ENCOUNTER — Other Ambulatory Visit: Payer: Self-pay | Admitting: Family Medicine

## 2013-05-09 MED ORDER — LEVOTHYROXINE SODIUM 100 MCG PO TABS: 100.0000 ug | ORAL_TABLET | Freq: Every day | ORAL | Status: DC

## 2013-05-20 ENCOUNTER — Encounter: Payer: Self-pay | Admitting: *Deleted

## 2013-05-20 DIAGNOSIS — E875 Hyperkalemia: Secondary | ICD-10-CM | POA: Insufficient documentation

## 2013-05-27 ENCOUNTER — Telehealth: Payer: Self-pay

## 2013-05-27 NOTE — Telephone Encounter (Signed)
Barbara [name] left v/m to verify accupril; pt taking 1 1/2 pill daily; Barbara thinks instructions were changed  to take 1/2 pill daily and she wants to verify to cut back one tab.Please advise.

## 2013-05-27 NOTE — Telephone Encounter (Signed)
Left voicemail on sister's phone letting her know that she is correct, Dr. Milinda Antis wants her sister to take 1/2 tab of the accupril

## 2013-06-23 ENCOUNTER — Telehealth: Payer: Self-pay | Admitting: Family Medicine

## 2013-06-23 DIAGNOSIS — E039 Hypothyroidism, unspecified: Secondary | ICD-10-CM

## 2013-06-23 DIAGNOSIS — I10 Essential (primary) hypertension: Secondary | ICD-10-CM

## 2013-06-23 DIAGNOSIS — E119 Type 2 diabetes mellitus without complications: Secondary | ICD-10-CM

## 2013-06-23 DIAGNOSIS — D649 Anemia, unspecified: Secondary | ICD-10-CM

## 2013-06-23 DIAGNOSIS — E7800 Pure hypercholesterolemia, unspecified: Secondary | ICD-10-CM

## 2013-06-23 NOTE — Telephone Encounter (Signed)
Message copied by Judy Pimple on Sun Jun 23, 2013  2:25 PM ------      Message from: Alvina Chou      Created: Tue Jun 18, 2013 11:00 AM      Regarding: Lab orders for 7.21.14       Patient is scheduled for CPX labs, please order future labs, Thanks , Terri       ------

## 2013-06-24 ENCOUNTER — Other Ambulatory Visit (INDEPENDENT_AMBULATORY_CARE_PROVIDER_SITE_OTHER): Payer: PRIVATE HEALTH INSURANCE

## 2013-06-24 DIAGNOSIS — E119 Type 2 diabetes mellitus without complications: Secondary | ICD-10-CM

## 2013-06-24 DIAGNOSIS — E039 Hypothyroidism, unspecified: Secondary | ICD-10-CM

## 2013-06-24 DIAGNOSIS — E875 Hyperkalemia: Secondary | ICD-10-CM

## 2013-06-24 DIAGNOSIS — I10 Essential (primary) hypertension: Secondary | ICD-10-CM

## 2013-06-24 DIAGNOSIS — E7800 Pure hypercholesterolemia, unspecified: Secondary | ICD-10-CM

## 2013-06-24 DIAGNOSIS — D649 Anemia, unspecified: Secondary | ICD-10-CM

## 2013-06-24 LAB — LIPID PANEL
Cholesterol: 217 mg/dL — ABNORMAL HIGH (ref 0–200)
Total CHOL/HDL Ratio: 6
VLDL: 43.6 mg/dL — ABNORMAL HIGH (ref 0.0–40.0)

## 2013-06-24 LAB — CBC WITH DIFFERENTIAL/PLATELET
Basophils Absolute: 0 10*3/uL (ref 0.0–0.1)
Eosinophils Absolute: 0.1 10*3/uL (ref 0.0–0.7)
Lymphocytes Relative: 34.9 % (ref 12.0–46.0)
MCHC: 34 g/dL (ref 30.0–36.0)
MCV: 91.2 fl (ref 78.0–100.0)
Monocytes Absolute: 0.5 10*3/uL (ref 0.1–1.0)
Neutrophils Relative %: 51.2 % (ref 43.0–77.0)
Platelets: 264 10*3/uL (ref 150.0–400.0)
RBC: 3.66 Mil/uL — ABNORMAL LOW (ref 3.87–5.11)
RDW: 14.1 % (ref 11.5–14.6)

## 2013-06-24 LAB — COMPREHENSIVE METABOLIC PANEL
ALT: 10 U/L (ref 0–35)
AST: 18 U/L (ref 0–37)
Albumin: 4.1 g/dL (ref 3.5–5.2)
Alkaline Phosphatase: 57 U/L (ref 39–117)
BUN: 17 mg/dL (ref 6–23)
Calcium: 9.8 mg/dL (ref 8.4–10.5)
Chloride: 109 mEq/L (ref 96–112)
Potassium: 4.3 mEq/L (ref 3.5–5.1)
Sodium: 141 mEq/L (ref 135–145)
Total Protein: 7.6 g/dL (ref 6.0–8.3)

## 2013-06-24 LAB — TSH: TSH: 2.26 u[IU]/mL (ref 0.35–5.50)

## 2013-07-01 ENCOUNTER — Encounter: Payer: Self-pay | Admitting: Family Medicine

## 2013-07-01 ENCOUNTER — Ambulatory Visit (INDEPENDENT_AMBULATORY_CARE_PROVIDER_SITE_OTHER): Payer: PRIVATE HEALTH INSURANCE | Admitting: Family Medicine

## 2013-07-01 ENCOUNTER — Other Ambulatory Visit (HOSPITAL_COMMUNITY)
Admission: RE | Admit: 2013-07-01 | Discharge: 2013-07-01 | Disposition: A | Discharge status: Home / Self Care | Payer: PRIVATE HEALTH INSURANCE | Source: Ambulatory Visit | Attending: Family Medicine | Admitting: Family Medicine

## 2013-07-01 VITALS — BP 104/62 | HR 43 | Temp 98.0°F | Ht 63.75 in | Wt 144.5 lb

## 2013-07-01 DIAGNOSIS — Z124 Encounter for screening for malignant neoplasm of cervix: Secondary | ICD-10-CM | POA: Insufficient documentation

## 2013-07-01 DIAGNOSIS — E119 Type 2 diabetes mellitus without complications: Secondary | ICD-10-CM

## 2013-07-01 DIAGNOSIS — Z01419 Encounter for gynecological examination (general) (routine) without abnormal findings: Secondary | ICD-10-CM

## 2013-07-01 DIAGNOSIS — N952 Postmenopausal atrophic vaginitis: Secondary | ICD-10-CM | POA: Insufficient documentation

## 2013-07-01 DIAGNOSIS — Z0000 Encounter for general adult medical examination without abnormal findings: Secondary | ICD-10-CM

## 2013-07-01 DIAGNOSIS — E039 Hypothyroidism, unspecified: Secondary | ICD-10-CM

## 2013-07-01 DIAGNOSIS — N950 Postmenopausal bleeding: Secondary | ICD-10-CM

## 2013-07-01 DIAGNOSIS — I10 Essential (primary) hypertension: Secondary | ICD-10-CM

## 2013-07-01 DIAGNOSIS — D649 Anemia, unspecified: Secondary | ICD-10-CM

## 2013-07-01 DIAGNOSIS — E7800 Pure hypercholesterolemia, unspecified: Secondary | ICD-10-CM

## 2013-07-01 NOTE — Assessment & Plan Note (Signed)
bp in fair control at this time  No changes needed  Disc lifstyle change with low sodium diet and exercise   

## 2013-07-01 NOTE — Progress Notes (Signed)
Subjective:    Patient ID: Barbara [name], female    DOB: 1941-04-25, 72 y.o.   MRN: [id number]  HPI I have personally reviewed the Medicare Annual Wellness questionnaire and have noted 1. The patient's medical and social history 2. Their use of alcohol, tobacco or illicit drugs 3. Their current medications and supplements 4. The patient's functional ability including ADL's, fall risks, home safety risks and hearing or visual             impairment. 5. Diet and physical activities 6. Evidence for depression or mood disorders  The patients weight, height, BMI have been recorded in the chart and visual acuity is per eye clinic.  I have made referrals, counseling and provided education to the patient based review of the above and I have provided the pt with a written personalized care plan for preventive services.  Continue to loose weight - bmi is 25  Getting lots of exercise in the garden and she eats healthy She is afraid to gain wt back   See scanned forms.  Routine anticipatory guidance given to patient.  See health maintenance. Flu 10/13  Shingles 5/13 vaccine  PNA 1/11 vaccine  Tetanus 3/09 vaccine Colon and endocsopy in 2/14 -both normal  Breast cancer screening- she does not want to get a mammogram - she says she has talked to her guardian about that  Self exam -no lumps or changes  Gyn follow up - no vaginal bleeding at all - stopped entirely for months  She had atropy- was given premarin cream-pt says she does not remember this - pt states she was told she did not need to go back  Will do exam today Advance directive - pt states she has that written up - with her guardian as POA (she is not capable of signing checks anymore) Cognitive function addressed- see scanned forms- and if abnormal then additional documentation follows.  (pt has MR and a guardian) Pt states her memory is ok   Falls-none - she is careful  Mood- has been fine/ no depression  PMH and SH  reviewed  Meds, vitals, and allergies reviewed.   ROS: See HPI.  Otherwise negative.    DM-  Lab Results  Component Value Date   HGBA1C 5.7 06/24/2013    On ace inhibitor for renal protection Takes metformin  No low sugars that she knows of - usually 90s-100  opthy exam 3/14  Anemia On B12 and iron  Improved  Had colonosc/ EGD in winter -both nl  Lab Results  Component Value Date   WBC 5.0 06/24/2013   HGB 11.4* 06/24/2013   HCT 33.4* 06/24/2013   MCV 91.2 06/24/2013   PLT 264.0 06/24/2013    bp is stable today  No cp or palpitations or headaches or edema  No side effects to medicines  BP Readings from Last 3 Encounters:  07/01/13 104/62  05/08/13 124/68  02/07/13 128/66     Lab Results  Component Value Date   CHOL 217* 06/24/2013   CHOL 201* 01/01/2013   CHOL 261* 06/19/2012   Lab Results  Component Value Date   HDL 36.80* 06/24/2013   HDL 33.40* 01/01/2013   HDL 42.20 06/19/2012   No results found for this basename: LDLCALC   Lab Results  Component Value Date   TRIG 218.0* 06/24/2013   TRIG 233.0* 01/01/2013   TRIG 284.0* 06/19/2012   Lab Results  Component Value Date   CHOLHDL 6 06/24/2013   CHOLHDL 6 01/01/2013  CHOLHDL 6 06/19/2012   Lab Results  Component Value Date   LDLDIRECT 135.7 06/24/2013   LDLDIRECT 106.7 01/01/2013   LDLDIRECT 137.6 06/19/2012    Watches diet for this   No change in medicines   Patient Active Problem List   Diagnosis Date Noted  . Vaginal atrophy 07/01/2013  . Hyperkalemia 05/20/2013  . Colon cancer screening 06/26/2012  . Hypothyroid 06/18/2012  . Encounter for routine gynecological examination 02/15/2012  . Obesity (BMI 30-39.9) 12/26/2011  . Anemia 12/26/2011  . Post-menopausal bleeding 12/26/2011  . INTERTRIGO, CANDIDAL 06/01/2010  . DIABETES MELLITUS, TYPE II 06/05/2007  . HYPERCHOLESTEROLEMIA 06/05/2007  . ANXIETY 06/05/2007  . EXPRESSIVE LANGUAGE DISORDER 06/05/2007  . ISCHEMIA, RETINAL 06/05/2007  .  HYPERTENSION 06/05/2007  . ALLERGIC RHINITIS 06/05/2007  . ANGIOEDEMA 06/05/2007   Past Medical History  Diagnosis Date  . Allergy   . Anxiety   . Diabetes mellitus   . Hypertension   . Hyperlipidemia   . GERD (gastroesophageal reflux disease)   . Expressive language disorder   . Retinal ischemia     history of  . Obesity   . Mentally disabled   . Learning disability   . Anemia    Past Surgical History  Procedure Laterality Date  . Cholecystectomy     History  Substance Use Topics  . Smoking status: Never Smoker   . Smokeless tobacco: Never Used  . Alcohol Use: No   Family History  Problem Relation Age of Onset  . Heart disease Father   . Hypertension Father   . Heart disease Brother   . Diabetes Brother    Allergies  Allergen Reactions  . Atorvastatin     REACTION: increased liver fxn tests  . Ezetimibe     REACTION: stomach upset/malaise  . Sulfonamide Derivatives     REACTION: rash   Current Outpatient Prescriptions on File Prior to Visit  Medication Sig Dispense Refill  . acetaminophen (TYLENOL) 500 MG tablet Take 500 mg by mouth every 6 (six) hours as needed.        Marland Kitchen amLODipine (NORVASC) 10 MG tablet Take 1 tablet (10 mg total) by mouth daily.  30 tablet  11  . bisoprolol-hydrochlorothiazide (ZIAC) 10-6.25 MG per tablet Take 1 tablet by mouth 2 (two) times daily.  60 tablet  11  . clotrimazole-betamethasone (LOTRISONE) cream Apply topically daily as needed.        . ferrous sulfate 325 (65 FE) MG tablet Take 1 tablet (325 mg total) by mouth daily with breakfast.    3  . fexofenadine (ALLEGRA) 180 MG tablet Take 180 mg by mouth as needed.      . fluticasone (FLONASE) 50 MCG/ACT nasal spray Place 2 sprays into the nose daily.  16 g  11  . gemfibrozil (LOPID) 600 MG tablet Take 1 tablet (600 mg total) by mouth 2 (two) times daily.  60 tablet  11  . Glucose Blood DISK by In Vitro route.        Marland Kitchen levothyroxine (SYNTHROID, LEVOTHROID) 100 MCG tablet Take 1  tablet (100 mcg total) by mouth daily.  30 tablet  5  . LUMIGAN 0.01 % SOLN Place 1 drop into the left eye daily.       . metFORMIN (GLUCOPHAGE) 500 MG tablet Take 1 tablet (500 mg total) by mouth daily with breakfast.  30 tablet  11  . omeprazole (PRILOSEC) 20 MG capsule Take 1 capsule (20 mg total) by mouth daily before breakfast.  30  capsule  11  . sertraline (ZOLOFT) 50 MG tablet Take 1 tablet (50 mg total) by mouth daily.  30 tablet  11  . vitamin B-12 (CYANOCOBALAMIN) 1000 MCG tablet Take 1 tablet (1,000 mcg total) by mouth daily.       No current facility-administered medications on file prior to visit.    Review of Systems Review of Systems  Constitutional: Negative for fever, appetite change, fatigue and unexpected weight change.  Eyes: Negative for pain and visual disturbance.  Respiratory: Negative for cough and shortness of breath.   Cardiovascular: Negative for cp or palpitations    Gastrointestinal: Negative for nausea, diarrhea and constipation.  Genitourinary: Negative for urgency and frequency.  Skin: Negative for pallor or rash   Neurological: Negative for weakness, light-headedness, numbness and headaches.  Hematological: Negative for adenopathy. Does not bruise/bleed easily.  Psychiatric/Behavioral: Negative for dysphoric mood. The patient is not nervous/anxious.         Objective:   Physical Exam  Constitutional: She appears well-developed and well-nourished. No distress.  HENT:  Head: Normocephalic and atraumatic.  Right Ear: External ear normal.  Left Ear: External ear normal.  Nose: Nose normal.  Mouth/Throat: Oropharynx is clear and moist.  Eyes: EOM are normal. Pupils are equal, round, and reactive to light. Right eye exhibits no discharge. Left eye exhibits no discharge. No scleral icterus.  R pupil is abnormal / with no vision   Neck: Normal range of motion. Neck supple. No JVD present. Carotid bruit is not present. No thyromegaly present.   Cardiovascular: Normal rate, regular rhythm, normal heart sounds and intact distal pulses.  Exam reveals no gallop.   Pulmonary/Chest: Effort normal and breath sounds normal. No respiratory distress. She has no wheezes. She has no rales.  Abdominal: Soft. Bowel sounds are normal. She exhibits no distension, no abdominal bruit and no mass. There is no tenderness.  Genitourinary: No breast swelling, tenderness, discharge or bleeding. There is no rash or tenderness on the right labia. There is no rash, tenderness or lesion on the left labia. Uterus is not enlarged and not tender. Cervix exhibits no motion tenderness and no friability. Right adnexum displays no mass, no tenderness and no fullness. Left adnexum displays no mass, no tenderness and no fullness. No bleeding around the vagina. No vaginal discharge found.  Small introitus - some difficulty emitting speculum without pain  Vaginal mucosa is very atrophic   Breast exam: No mass, nodules, thickening, tenderness, bulging, retraction, inflamation, nipple discharge or skin changes noted.  No axillary or clavicular LA.  Chaperoned exam.    Musculoskeletal: She exhibits no edema and no tenderness.  Lymphadenopathy:    She has no cervical adenopathy.  Neurological: She is alert. She has normal reflexes. No cranial nerve deficit. She exhibits normal muscle tone. Coordination normal.  Skin: Skin is warm and dry. No rash noted. No erythema. No pallor.  Psychiatric: She has a normal mood and affect.  Baseline MR pleasant          Assessment & Plan:

## 2013-07-01 NOTE — Assessment & Plan Note (Signed)
Lab Results  Component Value Date   HGBA1C 5.7 06/24/2013   With wt loss - this is very well controlled

## 2013-07-01 NOTE — Assessment & Plan Note (Signed)
Currently resolved s/p gyn work up

## 2013-07-01 NOTE — Assessment & Plan Note (Signed)
Hypothyroidism  Pt has no clinical changes No change in energy level/ hair or skin/ edema and no tremor Lab Results  Component Value Date   TSH 2.26 06/24/2013

## 2013-07-01 NOTE — Patient Instructions (Addendum)
I do recommend that you get a yearly mammogram- I know you decline this but please discuss it with your guardian Cholesterol is up a bit (Avoid red meat/ fried foods/ egg yolks/ fatty breakfast meats/ butter, cheese and high fat dairy/ and shellfish  ) Keep taking care of yourself  We did a pap smear today - will get results to you You need to wear sunscreen whenever you work outside- to prevent burns and skin cancer  Follow up with me with labs prior in about 6 months

## 2013-07-01 NOTE — Assessment & Plan Note (Signed)
Pt did not use the premarin cream given to her by gyn- no complaints

## 2013-07-01 NOTE — Assessment & Plan Note (Signed)
Exam done  Had HPV on pap last time as well as post menop bleeding and vag atropy No complaints at this time

## 2013-07-01 NOTE — Assessment & Plan Note (Signed)
Cholesterol is up  Disc goals for lipids and reasons to control them Rev labs with pt Rev low sat fat diet in detail  Will re check and f/u 6 mo

## 2013-07-01 NOTE — Assessment & Plan Note (Signed)
Reviewed health habits including diet and exercise and skin cancer prevention Also reviewed health mt list, fam hx and immunizations  See HPI Pt has guardian - MR and not competant to sign checks/ etc She declines mammogram but I urged her to disc with her guardian

## 2013-07-01 NOTE — Assessment & Plan Note (Signed)
Mild/improved since no longer having vaginal bleeding

## 2013-07-02 ENCOUNTER — Telehealth: Payer: Self-pay | Admitting: *Deleted

## 2013-07-02 ENCOUNTER — Other Ambulatory Visit (INDEPENDENT_AMBULATORY_CARE_PROVIDER_SITE_OTHER): Payer: PRIVATE HEALTH INSURANCE

## 2013-07-02 DIAGNOSIS — Z1211 Encounter for screening for malignant neoplasm of colon: Secondary | ICD-10-CM

## 2013-07-02 LAB — FECAL OCCULT BLOOD, IMMUNOCHEMICAL: Fecal Occult Bld: NEGATIVE

## 2013-07-02 NOTE — Telephone Encounter (Signed)
[facility] called and needed Ifob order, order placed (okay per Dr. Milinda Antis)

## 2013-07-02 NOTE — Addendum Note (Signed)
Addended by: Alvina Chou on: 07/02/2013 04:10 PM   Modules accepted: Orders

## 2013-07-03 ENCOUNTER — Encounter: Payer: Self-pay | Admitting: *Deleted

## 2013-12-23 ENCOUNTER — Other Ambulatory Visit (INDEPENDENT_AMBULATORY_CARE_PROVIDER_SITE_OTHER): Payer: PRIVATE HEALTH INSURANCE

## 2013-12-23 DIAGNOSIS — E119 Type 2 diabetes mellitus without complications: Secondary | ICD-10-CM

## 2013-12-23 DIAGNOSIS — E7800 Pure hypercholesterolemia, unspecified: Secondary | ICD-10-CM

## 2013-12-23 LAB — LIPID PANEL
CHOL/HDL RATIO: 6
Cholesterol: 212 mg/dL — ABNORMAL HIGH (ref 0–200)
HDL: 35.4 mg/dL — ABNORMAL LOW (ref >39.00)
Triglycerides: 229 mg/dL — ABNORMAL HIGH (ref 0.0–149.0)
VLDL: 45.8 mg/dL — AB (ref 0.0–40.0)

## 2013-12-23 LAB — HEMOGLOBIN A1C: Hgb A1c MFr Bld: 5.6 % (ref 4.6–6.5)

## 2013-12-23 LAB — LDL CHOLESTEROL, DIRECT: LDL DIRECT: 134.6 mg/dL

## 2013-12-31 ENCOUNTER — Ambulatory Visit (INDEPENDENT_AMBULATORY_CARE_PROVIDER_SITE_OTHER): Payer: PRIVATE HEALTH INSURANCE | Admitting: Family Medicine

## 2013-12-31 ENCOUNTER — Encounter: Payer: Self-pay | Admitting: Family Medicine

## 2013-12-31 ENCOUNTER — Ambulatory Visit: Payer: PRIVATE HEALTH INSURANCE | Admitting: Family Medicine

## 2013-12-31 VITALS — BP 130/68 | HR 45 | Temp 97.9°F | Wt 150.5 lb

## 2013-12-31 DIAGNOSIS — E119 Type 2 diabetes mellitus without complications: Secondary | ICD-10-CM

## 2013-12-31 DIAGNOSIS — E7800 Pure hypercholesterolemia, unspecified: Secondary | ICD-10-CM

## 2013-12-31 DIAGNOSIS — I10 Essential (primary) hypertension: Secondary | ICD-10-CM

## 2013-12-31 MED ORDER — BISOPROLOL-HYDROCHLOROTHIAZIDE 10-6.25 MG PO TABS: 1.0000 | ORAL_TABLET | Freq: Two times a day (BID) | ORAL | Status: DC

## 2013-12-31 MED ORDER — METFORMIN HCL 500 MG PO TABS: 500.0000 mg | ORAL_TABLET | Freq: Every day | ORAL | Status: DC

## 2013-12-31 MED ORDER — AMLODIPINE BESYLATE 10 MG PO TABS: 10.0000 mg | ORAL_TABLET | Freq: Every day | ORAL | Status: DC

## 2013-12-31 MED ORDER — OMEPRAZOLE 20 MG PO CPDR: 20.0000 mg | DELAYED_RELEASE_CAPSULE | Freq: Every day | ORAL | Status: DC

## 2013-12-31 MED ORDER — QUINAPRIL HCL 40 MG PO TABS: 20.0000 mg | ORAL_TABLET | Freq: Every day | ORAL | Status: DC

## 2013-12-31 MED ORDER — LEVOTHYROXINE SODIUM 100 MCG PO TABS: 100.0000 ug | ORAL_TABLET | Freq: Every day | ORAL | Status: DC

## 2013-12-31 MED ORDER — SERTRALINE HCL 50 MG PO TABS: 50.0000 mg | ORAL_TABLET | Freq: Every day | ORAL | Status: DC

## 2013-12-31 MED ORDER — GEMFIBROZIL 600 MG PO TABS: 600.0000 mg | ORAL_TABLET | Freq: Two times a day (BID) | ORAL | Status: DC

## 2013-12-31 NOTE — Progress Notes (Signed)
Subjective:    Patient ID: Barbara [name], female    DOB: [birth year], 73 y.o.   MRN: [id number]  HPI Here for f/u of chronic medical problems   Wt is up 6 lb with bmi of 26 She is taking care of herself  Wants to loose the wt she gained , is overall very proud of her wt loss and lifestyle change Is eating a healthy diet  Is getting exercise - goes [facility] for that   Lost her ex husband - but doing ok with grief   bp is stable today  No cp or palpitations or headaches or edema  No side effects to medicines  BP Readings from Last 3 Encounters:  12/31/13 130/68  07/01/13 104/62  05/08/13 124/68     Hyperlipidemia Lab Results  Component Value Date   CHOL 212* 12/23/2013   CHOL 217* 06/24/2013   CHOL 201* 01/01/2013   Lab Results  Component Value Date   HDL 35.40* 12/23/2013   HDL 36.80* 06/24/2013   HDL 33.40* 01/01/2013   No results found for this basename: LDLCALC   Lab Results  Component Value Date   TRIG 229.0* 12/23/2013   TRIG 218.0* 06/24/2013   TRIG 233.0* 01/01/2013   Lab Results  Component Value Date   CHOLHDL 6 12/23/2013   CHOLHDL 6 06/24/2013   CHOLHDL 6 01/01/2013   Lab Results  Component Value Date   LDLDIRECT 134.6 12/23/2013   LDLDIRECT 135.7 06/24/2013   LDLDIRECT 106.7 01/01/2013  trig up a bit - she still takes the gemfibrozil - no missed doses  She does try to stay away from fried foods   Diabetes Home sugar results  DM diet - good  Exercise -good  Symptoms-- none  A1C last  Lab Results  Component Value Date   HGBA1C 5.6 12/23/2013  very well controlled   No problems with medications - metformin  Renal protection- ace  Last eye exam - went in the fall and has f/u soon - wants to address the non functional of right eye- it waters all the time   Mood is good  Has more of a social life now    Patient Active Problem List   Diagnosis Date Noted  . Vaginal atrophy 07/01/2013  . Encounter for Medicare annual wellness exam 07/01/2013  .  Hyperkalemia 05/20/2013  . Colon cancer screening 06/26/2012  . Hypothyroid 06/18/2012  . Encounter for routine gynecological examination 02/15/2012  . Anemia 12/26/2011  . Post-menopausal bleeding 12/26/2011  . INTERTRIGO, CANDIDAL 06/01/2010  . DIABETES MELLITUS, TYPE II 06/05/2007  . HYPERCHOLESTEROLEMIA 06/05/2007  . ANXIETY 06/05/2007  . EXPRESSIVE LANGUAGE DISORDER 06/05/2007  . ISCHEMIA, RETINAL 06/05/2007  . HYPERTENSION 06/05/2007  . ALLERGIC RHINITIS 06/05/2007  . ANGIOEDEMA 06/05/2007   Past Medical History  Diagnosis Date  . Allergy   . Anxiety   . Diabetes mellitus   . Hypertension   . Hyperlipidemia   . GERD (gastroesophageal reflux disease)   . Expressive language disorder   . Retinal ischemia     history of  . Obesity   . Mentally disabled   . Learning disability   . Anemia    Past Surgical History  Procedure Laterality Date  . Cholecystectomy     History  Substance Use Topics  . Smoking status: Never Smoker   . Smokeless tobacco: Never Used  . Alcohol Use: No   Family History  Problem Relation Age of Onset  . Heart disease Father   .  Hypertension Father   . Heart disease Brother   . Diabetes Brother    Allergies  Allergen Reactions  . Atorvastatin     REACTION: increased liver fxn tests  . Ezetimibe     REACTION: stomach upset/malaise  . Sulfonamide Derivatives     REACTION: rash   Current Outpatient Prescriptions on File Prior to Visit  Medication Sig Dispense Refill  . acetaminophen (TYLENOL) 500 MG tablet Take 500 mg by mouth every 6 (six) hours as needed.        Marland Kitchen amLODipine (NORVASC) 10 MG tablet Take 1 tablet (10 mg total) by mouth daily.  30 tablet  11  . bisoprolol-hydrochlorothiazide (ZIAC) 10-6.25 MG per tablet Take 1 tablet by mouth 2 (two) times daily.  60 tablet  11  . clotrimazole-betamethasone (LOTRISONE) cream Apply topically daily as needed.        . ferrous sulfate 325 (65 FE) MG tablet Take 1 tablet (325 mg total) by  mouth daily with breakfast.    3  . fexofenadine (ALLEGRA) 180 MG tablet Take 180 mg by mouth as needed.      . fluticasone (FLONASE) 50 MCG/ACT nasal spray Place 2 sprays into the nose daily.  16 g  11  . gemfibrozil (LOPID) 600 MG tablet Take 1 tablet (600 mg total) by mouth 2 (two) times daily.  60 tablet  11  . Glucose Blood DISK by In Vitro route.        Marland Kitchen levothyroxine (SYNTHROID, LEVOTHROID) 100 MCG tablet Take 1 tablet (100 mcg total) by mouth daily.  30 tablet  5  . LUMIGAN 0.01 % SOLN Place 1 drop into the left eye daily.       . metFORMIN (GLUCOPHAGE) 500 MG tablet Take 1 tablet (500 mg total) by mouth daily with breakfast.  30 tablet  11  . omeprazole (PRILOSEC) 20 MG capsule Take 1 capsule (20 mg total) by mouth daily before breakfast.  30 capsule  11  . quinapril (ACCUPRIL) 40 MG tablet Take 20 mg by mouth daily.      . sertraline (ZOLOFT) 50 MG tablet Take 1 tablet (50 mg total) by mouth daily.  30 tablet  11  . vitamin B-12 (CYANOCOBALAMIN) 1000 MCG tablet Take 1 tablet (1,000 mcg total) by mouth daily.       No current facility-administered medications on file prior to visit.     Review of Systems Review of Systems  Constitutional: Negative for fever, appetite change, fatigue and unexpected weight change.  Eyes: Negative for pain and visual disturbance.  Respiratory: Negative for cough and shortness of breath.   Cardiovascular: Negative for cp or palpitations    Gastrointestinal: Negative for nausea, diarrhea and constipation.  Genitourinary: Negative for urgency and frequency.  Skin: Negative for pallor or rash   Neurological: Negative for weakness, light-headedness, numbness and headaches.  Hematological: Negative for adenopathy. Does not bruise/bleed easily.  Psychiatric/Behavioral: Negative for dysphoric mood. The patient is not nervous/anxious.         Objective:   Physical Exam  Constitutional: She appears well-developed and well-nourished. No distress.  HENT:   Head: Normocephalic and atraumatic.  Mouth/Throat: Oropharynx is clear and moist.  Eyes: Conjunctivae and EOM are normal. Pupils are equal, round, and reactive to light. Right eye exhibits no discharge. Left eye exhibits no discharge. No scleral icterus.  Neck: Normal range of motion. Neck supple. No JVD present. Carotid bruit is not present. No thyromegaly present.  Cardiovascular: Normal rate, regular rhythm,  normal heart sounds and intact distal pulses.  Exam reveals no gallop.   Pulmonary/Chest: Effort normal and breath sounds normal. No respiratory distress. She has no wheezes. She has no rales.  Abdominal: Soft. Bowel sounds are normal. She exhibits no distension and no mass. There is no tenderness.  Musculoskeletal: She exhibits no edema and no tenderness.  Lymphadenopathy:    She has no cervical adenopathy.  Neurological: She is alert. She has normal reflexes. No cranial nerve deficit. She exhibits normal muscle tone. Coordination normal.  Skin: Skin is warm and dry. No rash noted. No erythema. No pallor.  Psychiatric: She has a normal mood and affect.  Baseline speech impediment           Assessment & Plan:

## 2013-12-31 NOTE — Patient Instructions (Signed)
Keep working on [employer] and exercise  Especially watch out for fatty/ greasy foods for cholesterol (Avoid red meat/ fried foods/ egg yolks/ fatty breakfast meats/ butter, cheese and high fat dairy/ and shellfish  ) If you change your mind about getting a mammogram for breast cancer screening let me know - I think it is a good idea  Follow up for annual exam in 6 months with labs prior I will send your medicine refills to [place]     Fat and Cholesterol Control Diet Your diet has an affect on your fat and cholesterol levels in your blood and organs. Too much fat and cholesterol in your blood can affect your:  Heart.  Blood vessels (arteries, veins).  Gallbladder.  Liver.  Pancreas. CONTROL FAT AND CHOLESTEROL WITH DIET Certain foods raise cholesterol and others lower it. It is important to replace bad fats with other types of fat.  Do not eat:  Fatty meats, such as hot dogs and salami.  Stick margarine and some tub margarines that have "partially hydrogenated oils" in them.  Baked goods, such as cookies and crackers that have "partially hydrogenated oils" in them.  Saturated tropical oils, such as coconut and palm oil. Eat the following foods:  Round or loin cuts of red meat.  Chicken (without skin).  Fish.  Veal.  [place] breast.  Shellfish.  Fruit, such as apples.  Vegetables, such as broccoli, potatoes, and carrots.  Beans, peas, and lentils (legumes).  Grains, such as barley, rice, couscous, and bulgar wheat.  Pasta (without cream sauces). Look for foods that are nonfat, low in fat, and low in cholesterol.  FIND FOODS THAT ARE LOWER IN FAT AND CHOLESTEROL  Find foods with soluble fiber and plant sterols (phytosterol). You should eat 2 grams a day of these foods. These foods include:  Fruits.  Vegetables.  Whole grains.  Dried beans and peas.  Nuts and seeds.  Read package labels. Look for low-saturated fats, trans fat free, low-fat  foods.  Choose cheese that have only 2 to 3 grams of saturated fat per ounce.  Use heart-healthy tub margarine that is free of trans fat or partially hydrogenated oil.  Avoid buying baked goods that have partially hydrogenated oils in them. Instead, buy baked goods made with whole grains (whole-wheat or whole oat flour). Avoid baked goods labeled with "flour" or "enriched flour."  Buy non-creamy canned soups with reduced salt and no added fats. PREPARING YOUR FOOD  Broil, bake, steam, or roast foods. Do not fry food.  Use non-stick cooking sprays.  Use lemon or herbs to flavor food instead of using butter or stick margarine.  Use nonfat yogurt, salsa, or low-fat dressings for salads. LOW-SATURATED FAT / LOW-FAT FOOD SUBSTITUTES  Meats / Saturated Fat (g)  Avoid: Steak, marbled (3 oz/85 g) / 11 g.  Choose: Steak, lean (3 oz/85 g) / 4 g.  Avoid: Hamburger (3 oz/85 g) / 7 g.  Choose: Hamburger, lean (3 oz/85 g) / 5 g.  Avoid: Ham (3 oz/85 g) / 6 g.  Choose: Ham, lean cut (3 oz/85 g) / 2.4 g.  Avoid: Chicken, with skin, dark meat (3 oz/85 g) / 4 g.  Choose: Chicken, skin removed, dark meat (3 oz/85 g) / 2 g.  Avoid: Chicken, with skin, light meat (3 oz/85 g) / 2.5 g.  Choose: Chicken, skin removed, light meat (3 oz/85 g) / 1 g. Dairy / Saturated Fat (g)  Avoid: Whole milk (1 cup) / 5  g.  Choose: Low-fat milk, 2% (1 cup) / 3 g.  Choose: Low-fat milk, 1% (1 cup) / 1.5 g.  Choose: Skim milk (1 cup) / 0.3 g.  Avoid: Hard cheese (1 oz/28 g) / 6 g.  Choose: Skim milk cheese (1 oz/28 g) / 2 to 3 g.  Avoid: Cottage cheese, 4% fat (1 cup) / 6.5 g.  Choose: Low-fat cottage cheese, 1% fat (1 cup) / 1.5 g.  Avoid: Ice cream (1 cup) / 9 g.  Choose: Sherbet (1 cup) / 2.5 g.  Choose: Nonfat frozen yogurt (1 cup) / 0.3 g.  Choose: Frozen fruit bar / trace.  Avoid: Whipped cream (1 tbs) / 3.5 g.  Choose: Nondairy whipped topping (1 tbs) / 1 g. Condiments / Saturated  Fat (g)  Avoid: Mayonnaise (1 tbs) / 2 g.  Choose: Low-fat mayonnaise (1 tbs) / 1 g.  Avoid: Butter (1 tbs) / 7 g.  Choose: Extra light margarine (1 tbs) / 1 g.  Avoid: Coconut oil (1 tbs) / 11.8 g.  Choose: Olive oil (1 tbs) / 1.8 g.  Choose: Corn oil (1 tbs) / 1.7 g.  Choose: Safflower oil (1 tbs) / 1.2 g.  Choose: Sunflower oil (1 tbs) / 1.4 g.  Choose: Soybean oil (1 tbs) / 2.4 g .  Choose: Canola oil (1 tbs) / 1 g. Document Released: 05/22/2012 Document Revised: 07/24/2013 Document Reviewed: 05/22/2012 [facility] Patient Information 2014 [place].

## 2013-12-31 NOTE — Progress Notes (Signed)
Pre-visit discussion using our clinic review tool. No additional management support is needed unless otherwise documented below in the visit note.  

## 2014-01-01 NOTE — Assessment & Plan Note (Signed)
Disc goals for lipids and reasons to control them Rev labs with pt Rev low sat fat diet in detail Up a bit -no missed doses of fibrate Given easy to read diet- disc avoidance of fatty foods and sugar

## 2014-01-01 NOTE — Assessment & Plan Note (Signed)
Lab Results  Component Value Date   HGBA1C 5.6 12/23/2013   Stable and very well controlled No low sugars  Disc wt mt and low glycemic diet

## 2014-01-01 NOTE — Assessment & Plan Note (Signed)
BP: 130/68 mmHg  bp in fair control at this time  No changes needed Disc lifstyle change with low sodium diet and exercise   Labs reviewed

## 2014-01-04 ENCOUNTER — Other Ambulatory Visit: Payer: Self-pay | Admitting: Family Medicine

## 2014-03-19 ENCOUNTER — Ambulatory Visit: Payer: Self-pay | Admitting: Ophthalmology

## 2014-05-15 ENCOUNTER — Other Ambulatory Visit: Payer: Self-pay | Admitting: Family Medicine

## 2014-06-23 ENCOUNTER — Telehealth: Payer: Self-pay | Admitting: Family Medicine

## 2014-06-23 DIAGNOSIS — E038 Other specified hypothyroidism: Secondary | ICD-10-CM

## 2014-06-23 DIAGNOSIS — E7800 Pure hypercholesterolemia, unspecified: Secondary | ICD-10-CM

## 2014-06-23 DIAGNOSIS — E119 Type 2 diabetes mellitus without complications: Secondary | ICD-10-CM

## 2014-06-23 DIAGNOSIS — I10 Essential (primary) hypertension: Secondary | ICD-10-CM

## 2014-06-23 NOTE — Telephone Encounter (Signed)
Message copied by Abner Greenspan on Mon Jun 23, 2014  8:58 PM ------      Message from: Ellamae Sia      Created: Thu Jun 19, 2014  2:58 PM      Regarding: Lab orders for Tuesday, 7.21.15       Patient is scheduled for CPX labs, please order future labs, Thanks , Terri            Plus diabetic bundle needs LDL ------

## 2014-06-24 ENCOUNTER — Other Ambulatory Visit (INDEPENDENT_AMBULATORY_CARE_PROVIDER_SITE_OTHER): Payer: PRIVATE HEALTH INSURANCE

## 2014-06-24 DIAGNOSIS — E7800 Pure hypercholesterolemia, unspecified: Secondary | ICD-10-CM

## 2014-06-24 DIAGNOSIS — E038 Other specified hypothyroidism: Secondary | ICD-10-CM

## 2014-06-24 DIAGNOSIS — I10 Essential (primary) hypertension: Secondary | ICD-10-CM

## 2014-06-24 DIAGNOSIS — E119 Type 2 diabetes mellitus without complications: Secondary | ICD-10-CM

## 2014-06-24 LAB — COMPREHENSIVE METABOLIC PANEL
ALBUMIN: 4 g/dL (ref 3.5–5.2)
ALK PHOS: 47 U/L (ref 39–117)
ALT: 11 U/L (ref 0–35)
AST: 15 U/L (ref 0–37)
BUN: 23 mg/dL (ref 6–23)
CO2: 24 mEq/L (ref 19–32)
Calcium: 10 mg/dL (ref 8.4–10.5)
Chloride: 106 mEq/L (ref 96–112)
Creatinine, Ser: 0.8 mg/dL (ref 0.4–1.2)
GFR: 70.76 mL/min (ref >60.00)
GLUCOSE: 126 mg/dL — AB (ref 70–99)
POTASSIUM: 4.1 meq/L (ref 3.5–5.1)
SODIUM: 139 meq/L (ref 135–145)
TOTAL PROTEIN: 7.3 g/dL (ref 6.0–8.3)
Total Bilirubin: 0.6 mg/dL (ref 0.2–1.2)

## 2014-06-24 LAB — CBC WITH DIFFERENTIAL/PLATELET
Basophils Absolute: 0 10*3/uL (ref 0.0–0.1)
Basophils Relative: 0.6 % (ref 0.0–3.0)
EOS ABS: 0.1 10*3/uL (ref 0.0–0.7)
EOS PCT: 2.5 % (ref 0.0–5.0)
HCT: 33.9 % — ABNORMAL LOW (ref 36.0–46.0)
Hemoglobin: 11.7 g/dL — ABNORMAL LOW (ref 12.0–15.0)
Lymphocytes Relative: 28.9 % (ref 12.0–46.0)
Lymphs Abs: 1.3 10*3/uL (ref 0.7–4.0)
MCHC: 34.4 g/dL (ref 30.0–36.0)
MCV: 90.5 fl (ref 78.0–100.0)
Monocytes Absolute: 0.4 10*3/uL (ref 0.1–1.0)
Monocytes Relative: 9.8 % (ref 3.0–12.0)
NEUTROS PCT: 58.2 % (ref 43.0–77.0)
Neutro Abs: 2.6 10*3/uL (ref 1.4–7.7)
PLATELETS: 282 10*3/uL (ref 150.0–400.0)
RBC: 3.75 Mil/uL — ABNORMAL LOW (ref 3.87–5.11)
RDW: 13.6 % (ref 11.5–15.5)
WBC: 4.4 10*3/uL (ref 4.0–10.5)

## 2014-06-24 LAB — LIPID PANEL
CHOL/HDL RATIO: 6
Cholesterol: 214 mg/dL — ABNORMAL HIGH (ref 0–200)
HDL: 35.6 mg/dL — ABNORMAL LOW (ref >39.00)
LDL CALC: 118 mg/dL — AB (ref 0–99)
NonHDL: 178.4
Triglycerides: 300 mg/dL — ABNORMAL HIGH (ref 0.0–149.0)
VLDL: 60 mg/dL — ABNORMAL HIGH (ref 0.0–40.0)

## 2014-06-24 LAB — TSH: TSH: 2.07 u[IU]/mL (ref 0.35–4.50)

## 2014-06-24 LAB — HEMOGLOBIN A1C: Hgb A1c MFr Bld: 5.8 % (ref 4.6–6.5)

## 2014-07-01 ENCOUNTER — Ambulatory Visit (INDEPENDENT_AMBULATORY_CARE_PROVIDER_SITE_OTHER): Payer: PRIVATE HEALTH INSURANCE | Admitting: Family Medicine

## 2014-07-01 ENCOUNTER — Encounter: Payer: Self-pay | Admitting: Family Medicine

## 2014-07-01 VITALS — BP 120/70 | HR 52 | Temp 98.1°F | Ht 64.0 in | Wt 151.5 lb

## 2014-07-01 DIAGNOSIS — Z0000 Encounter for general adult medical examination without abnormal findings: Secondary | ICD-10-CM

## 2014-07-01 DIAGNOSIS — Z1211 Encounter for screening for malignant neoplasm of colon: Secondary | ICD-10-CM

## 2014-07-01 DIAGNOSIS — I10 Essential (primary) hypertension: Secondary | ICD-10-CM

## 2014-07-01 DIAGNOSIS — E119 Type 2 diabetes mellitus without complications: Secondary | ICD-10-CM

## 2014-07-01 DIAGNOSIS — D649 Anemia, unspecified: Secondary | ICD-10-CM

## 2014-07-01 DIAGNOSIS — E038 Other specified hypothyroidism: Secondary | ICD-10-CM

## 2014-07-01 DIAGNOSIS — E7800 Pure hypercholesterolemia, unspecified: Secondary | ICD-10-CM

## 2014-07-01 DIAGNOSIS — Z23 Encounter for immunization: Secondary | ICD-10-CM

## 2014-07-01 MED ORDER — BISOPROLOL-HYDROCHLOROTHIAZIDE 10-6.25 MG PO TABS: 1.0000 | ORAL_TABLET | Freq: Two times a day (BID) | ORAL | Status: DC

## 2014-07-01 MED ORDER — OMEPRAZOLE 20 MG PO CPDR: 20.0000 mg | DELAYED_RELEASE_CAPSULE | Freq: Every day | ORAL | Status: DC

## 2014-07-01 MED ORDER — GEMFIBROZIL 600 MG PO TABS: 600.0000 mg | ORAL_TABLET | Freq: Two times a day (BID) | ORAL | Status: DC

## 2014-07-01 MED ORDER — QUINAPRIL HCL 40 MG PO TABS: 20.0000 mg | ORAL_TABLET | Freq: Every day | ORAL | Status: DC

## 2014-07-01 MED ORDER — AMLODIPINE BESYLATE 10 MG PO TABS: 10.0000 mg | ORAL_TABLET | Freq: Every day | ORAL | Status: DC

## 2014-07-01 MED ORDER — LEVOTHYROXINE SODIUM 100 MCG PO TABS: 100.0000 ug | ORAL_TABLET | Freq: Every day | ORAL | Status: DC

## 2014-07-01 MED ORDER — SERTRALINE HCL 50 MG PO TABS: 50.0000 mg | ORAL_TABLET | Freq: Every day | ORAL | Status: DC

## 2014-07-01 MED ORDER — FLUTICASONE PROPIONATE 50 MCG/ACT NA SUSP: NASAL | Status: DC

## 2014-07-01 MED ORDER — METFORMIN HCL 500 MG PO TABS: 500.0000 mg | ORAL_TABLET | Freq: Every day | ORAL | Status: DC

## 2014-07-01 NOTE — Assessment & Plan Note (Signed)
IFOB given  No recall recommended from colonosc  Mild anemia is stable

## 2014-07-01 NOTE — Assessment & Plan Note (Signed)
LDL not at goal but I do not think benefits would exceed risks in adding statin to her lopid (in addn intol of statins in the past) Disc diet in detail-low sat fat

## 2014-07-01 NOTE — Assessment & Plan Note (Signed)
bp in fair control at this time  BP Readings from Last 1 Encounters:  07/01/14 120/70   No changes needed Disc lifstyle change with low sodium diet and exercise  Labs rev F/u 6 mo

## 2014-07-01 NOTE — Assessment & Plan Note (Signed)
Hypothyroidism  Pt has no clinical changes No change in energy level/ hair or skin/ edema and no tremor Lab Results  Component Value Date   TSH 2.07 06/24/2014

## 2014-07-01 NOTE — Assessment & Plan Note (Signed)
Lab Results  Component Value Date   HGBA1C 5.8 06/24/2014   Doing well  Rev low sugar diet  Wt is stable

## 2014-07-01 NOTE — Progress Notes (Signed)
Pre visit review using our clinic review tool, if applicable. No additional management support is needed unless otherwise documented below in the visit note. 

## 2014-07-01 NOTE — Assessment & Plan Note (Signed)
Reviewed health habits including diet and exercise and skin cancer prevention Reviewed appropriate screening tests for age  Also reviewed health mt list, fam hx and immunization status , as well as social and family history   See HPI Cannot answer all questions due to mental impairment/learning disorder - did the best we could and inst to give her paperwork to her guardian Labs reviewed in detail  Noted sun damage and sunburn-did inst pt to be more vigalent about sunblock use and re application

## 2014-07-01 NOTE — Assessment & Plan Note (Signed)
Stable/ mild/ likely due to chronic dz Neg GI work up  No symptoms  Will continue to follow

## 2014-07-01 NOTE — Patient Instructions (Addendum)
prevnar vaccine today (this is a pneumonia booster vaccine)  You again declined a mammogram - please let me know if you change your mind because this is important -talk to your sister about it  Your cholesterol is mildly high    (Avoid red meat/ fried foods/ egg yolks/ fatty breakfast meats/ butter, cheese and high fat dairy/ and shellfish  ) Your blood sugar is stable  Thyroid is stable  Mild anemia that is stable  Keep using sunscreen-you have sunburn and some signs of sun damage from being outdoors so much

## 2014-07-01 NOTE — Progress Notes (Signed)
Subjective:    Patient ID: Barbara [name], female    DOB: 13-Jun-1941, 73 y.o.   MRN: [id number]  HPI I have personally reviewed the Medicare Annual Wellness questionnaire and have noted 1. The patient's medical and social history 2. Their use of alcohol, tobacco or illicit drugs 3. Their current medications and supplements 4. The patient's functional ability including ADL's, fall risks, home safety risks and hearing or visual             impairment. 5. Diet and physical activities 6. Evidence for depression or mood disorders  The patients weight, height, BMI have been recorded in the chart and visual acuity is per eye clinic.  I have made referrals, counseling and provided education to the patient based review of the above and I have provided the pt with a written personalized care plan for preventive services.  Wt is up 1 lb with bmi of 25  Has been feeling ok  Has been working in the garden  Had cataract surgery on eye -that went well (still problems in the bright light)  See scanned forms.  Routine anticipatory guidance given to patient.  See health maintenance. Colon cancer screening-colonosc 3/14 - no recall/ and also ifob card 7/14  Breast cancer screening - pt declines mammograms  Self breast exam- no lumps or changes , but wants a breast exam today Flu vaccine 11/14-always keeps up with that  Tetanus vaccine 3/09  Pneumovax 1/11 due for prevnar vaccine today Zoster vaccine -has had     Advance directive- does not have that done  Cognitive function addressed- see scanned forms- and if abnormal then additional documentation follows. - memory has been good -pt states no problems at all   One fall in the past year- she got tangled in a vine -no injuries  No broken bones at all   PMH and SH reviewed  Meds, vitals, and allergies reviewed.   ROS: See HPI.  Otherwise negative.    DM Lab Results  Component Value Date   HGBA1C 5.8 06/24/2014   opthy 10/14 The lowest  her sugar got - was 65 (one time only when she forgot to eat a meal)  Anemia is stable- very mild  Lab Results  Component Value Date   WBC 4.4 06/24/2014   HGB 11.7* 06/24/2014   HCT 33.9* 06/24/2014   MCV 90.5 06/24/2014   PLT 282.0 06/24/2014   had colonosc and endoscopy  Hyperlipidemia Lab Results  Component Value Date   CHOL 214* 06/24/2014   CHOL 212* 12/23/2013   CHOL 217* 06/24/2013   Lab Results  Component Value Date   HDL 35.60* 06/24/2014   HDL 35.40* 12/23/2013   HDL 36.80* 06/24/2013   Lab Results  Component Value Date   LDLCALC 118* 06/24/2014   Lab Results  Component Value Date   TRIG 300.0* 06/24/2014   TRIG 229.0* 12/23/2013   TRIG 218.0* 06/24/2013   Lab Results  Component Value Date   CHOLHDL 6 06/24/2014   CHOLHDL 6 12/23/2013   CHOLHDL 6 06/24/2013   Lab Results  Component Value Date   LDLDIRECT 134.6 12/23/2013   LDLDIRECT 135.7 06/24/2013   LDLDIRECT 106.7 01/01/2013   she takes lopid  Intol of statins  Eats well and knows what to avoid  She does some exercise - likes to use exercise bike   bp is stable today  No cp or palpitations or headaches or edema  No side effects to medicines  BP Readings  from Last 3 Encounters:  07/01/14 136/64  12/31/13 130/68  07/01/13 104/62     Hypothyroidism  Pt has no clinical changes No change in energy level/ hair or skin/ edema and no tremor Lab Results  Component Value Date   TSH 2.07 06/24/2014      Patient Active Problem List   Diagnosis Date Noted  . Vaginal atrophy 07/01/2013  . Encounter for Medicare annual wellness exam 07/01/2013  . Hyperkalemia 05/20/2013  . Colon cancer screening 06/26/2012  . Hypothyroid 06/18/2012  . Encounter for routine gynecological examination 02/15/2012  . Anemia 12/26/2011  . Post-menopausal bleeding 12/26/2011  . INTERTRIGO, CANDIDAL 06/01/2010  . DIABETES MELLITUS, TYPE II 06/05/2007  . HYPERCHOLESTEROLEMIA 06/05/2007  . ANXIETY 06/05/2007  . EXPRESSIVE LANGUAGE  DISORDER 06/05/2007  . ISCHEMIA, RETINAL 06/05/2007  . HYPERTENSION 06/05/2007  . ALLERGIC RHINITIS 06/05/2007  . ANGIOEDEMA 06/05/2007   Past Medical History  Diagnosis Date  . Allergy   . Anxiety   . Diabetes mellitus   . Hypertension   . Hyperlipidemia   . GERD (gastroesophageal reflux disease)   . Expressive language disorder   . Retinal ischemia     history of  . Obesity   . Mentally disabled   . Learning disability   . Anemia    Past Surgical History  Procedure Laterality Date  . Cholecystectomy     History  Substance Use Topics  . Smoking status: Never Smoker   . Smokeless tobacco: Never Used  . Alcohol Use: No   Family History  Problem Relation Age of Onset  . Heart disease Father   . Hypertension Father   . Heart disease Brother   . Diabetes Brother    Allergies  Allergen Reactions  . Atorvastatin     REACTION: increased liver fxn tests  . Ezetimibe     REACTION: stomach upset/malaise  . Sulfonamide Derivatives     REACTION: rash   Current Outpatient Prescriptions on File Prior to Visit  Medication Sig Dispense Refill  . acetaminophen (TYLENOL) 500 MG tablet Take 500 mg by mouth every 6 (six) hours as needed.        Marland Kitchen amLODipine (NORVASC) 10 MG tablet Take 1 tablet (10 mg total) by mouth daily.  30 tablet  11  . bisoprolol-hydrochlorothiazide (ZIAC) 10-6.25 MG per tablet Take 1 tablet by mouth 2 (two) times daily.  60 tablet  11  . clotrimazole-betamethasone (LOTRISONE) cream Apply topically daily as needed.        . ferrous sulfate 325 (65 FE) MG tablet Take 1 tablet (325 mg total) by mouth daily with breakfast.    3  . fexofenadine (ALLEGRA) 180 MG tablet Take 180 mg by mouth as needed.      . fluticasone (FLONASE) 50 MCG/ACT nasal spray USE TWO SPRAYS IN EACH NOSTRIL DAILY  16 g  1  . gemfibrozil (LOPID) 600 MG tablet Take 1 tablet (600 mg total) by mouth 2 (two) times daily.  60 tablet  11  . Glucose Blood DISK by In Vitro route.        Marland Kitchen  levothyroxine (SYNTHROID, LEVOTHROID) 100 MCG tablet Take 1 tablet (100 mcg total) by mouth daily.  30 tablet  11  . LUMIGAN 0.01 % SOLN Place 1 drop into the left eye daily.       . metFORMIN (GLUCOPHAGE) 500 MG tablet Take 1 tablet (500 mg total) by mouth daily with breakfast.  30 tablet  11  . omeprazole (PRILOSEC) 20 MG  capsule Take 1 capsule (20 mg total) by mouth daily before breakfast.  30 capsule  11  . quinapril (ACCUPRIL) 40 MG tablet Take 0.5 tablets (20 mg total) by mouth daily.  15 tablet  11  . sertraline (ZOLOFT) 50 MG tablet Take 1 tablet (50 mg total) by mouth daily.  30 tablet  11  . vitamin B-12 (CYANOCOBALAMIN) 1000 MCG tablet Take 1 tablet (1,000 mcg total) by mouth daily.       No current facility-administered medications on file prior to visit.     Review of Systems Review of Systems  Constitutional: Negative for fever, appetite change, fatigue and unexpected weight change.  Eyes: Negative for pain and visual disturbance.  Respiratory: Negative for cough and shortness of breath.   Cardiovascular: Negative for cp or palpitations    Gastrointestinal: Negative for nausea, diarrhea and constipation.  Genitourinary: Negative for urgency and frequency.  Skin: Negative for pallor or rash   Neurological: Negative for weakness, light-headedness, numbness and headaches.  Hematological: Negative for adenopathy. Does not bruise/bleed easily.  Psychiatric/Behavioral: Negative for dysphoric mood. The patient is not nervous/anxious.         Objective:   Physical Exam  Constitutional: She appears well-developed and well-nourished. No distress.  HENT:  Head: Normocephalic and atraumatic.  Right Ear: External ear normal.  Left Ear: External ear normal.  Mouth/Throat: Oropharynx is clear and moist.  Eyes: EOM are normal. Pupils are equal, round, and reactive to light. No scleral icterus.  R pupil has a while spot (baseline) from ischemia   Neck: Normal range of motion. Neck  supple. No JVD present. Carotid bruit is not present. No thyromegaly present.  Cardiovascular: Normal rate, regular rhythm, normal heart sounds and intact distal pulses.  Exam reveals no gallop.   Pulmonary/Chest: Effort normal and breath sounds normal. No respiratory distress. She has no wheezes. She exhibits no tenderness.  Abdominal: Soft. Bowel sounds are normal. She exhibits no distension, no abdominal bruit and no mass. There is no tenderness.  Genitourinary: No breast swelling, tenderness, discharge or bleeding.  Breast exam: No mass, nodules, thickening, tenderness, bulging, retraction, inflamation, nipple discharge or skin changes noted.  No axillary or clavicular LA.      Musculoskeletal: Normal range of motion. She exhibits no edema and no tenderness.  Lymphadenopathy:    She has no cervical adenopathy.  Neurological: She is alert. She has normal reflexes. No cranial nerve deficit. She exhibits normal muscle tone. Coordination normal.  Skin: Skin is warm and dry. No rash noted. No erythema. No pallor.  lentigos and SKs  Solar aging present  Mild sunburn on upper back and neck -w/o blistering   Psychiatric: She has a normal mood and affect.  Baseline MR and speech impediment  Pleasant and attentive  Answers questions appropriately           Assessment & Plan:   Problem List Items Addressed This Visit     Cardiovascular and Mediastinum   HYPERTENSION - Primary      bp in fair control at this time  BP Readings from Last 1 Encounters:  07/01/14 120/70   No changes needed Disc lifstyle change with low sodium diet and exercise  Labs rev F/u 6 mo    Relevant Medications      quinapril (ACCUPRIL) tablet      gemfibrozil (LOPID) tablet 600 mg      bisoprolol-hydrochlorothiazide (ZIAC) 10-6.25 MG per tablet      amLODIpine (NORVASC) tablet  Other Relevant Orders      Comprehensive metabolic panel     Endocrine   DIABETES MELLITUS, TYPE II      Lab Results    Component Value Date   HGBA1C 5.8 06/24/2014   Doing well  Rev low sugar diet  Wt is stable     Relevant Medications      quinapril (ACCUPRIL) tablet      metFORMIN (GLUCOPHAGE) tablet      bisoprolol-hydrochlorothiazide (ZIAC) 10-6.25 MG per tablet   Other Relevant Orders      Hemoglobin A1c   Hypothyroid      Hypothyroidism  Pt has no clinical changes No change in energy level/ hair or skin/ edema and no tremor Lab Results  Component Value Date   TSH 2.07 06/24/2014        Relevant Medications      levothyroxine (SYNTHROID, LEVOTHROID) tablet     Other   HYPERCHOLESTEROLEMIA     LDL not at goal but I do not think benefits would exceed risks in adding statin to her lopid (in addn intol of statins in the past) Disc diet in detail-low sat fat     Relevant Medications      quinapril (ACCUPRIL) tablet      gemfibrozil (LOPID) tablet 600 mg      bisoprolol-hydrochlorothiazide (ZIAC) 10-6.25 MG per tablet      amLODIpine (NORVASC) tablet   Other Relevant Orders      Lipid panel   Anemia     Stable/ mild/ likely due to chronic dz Neg GI work up  No symptoms  Will continue to follow     Colon cancer screening     IFOB given  No recall recommended from colonosc  Mild anemia is stable     Relevant Orders      Fecal occult blood, imunochemical   Encounter for Medicare annual wellness exam     Reviewed health habits including diet and exercise and skin cancer prevention Reviewed appropriate screening tests for age  Also reviewed health mt list, fam hx and immunization status , as well as social and family history   See HPI Cannot answer all questions due to mental impairment/learning disorder - did the best we could and inst to give her paperwork to her guardian Labs reviewed in detail  Noted sun damage and sunburn-did inst pt to be more vigalent about sunblock use and re application      Other Visit Diagnoses   Need for vaccination with 13-polyvalent pneumococcal  conjugate vaccine        Relevant Orders       Pneumococcal conjugate vaccine 13-valent (Completed)

## 2014-12-09 DIAGNOSIS — H35352 Cystoid macular degeneration, left eye: Secondary | ICD-10-CM | POA: Diagnosis not present

## 2014-12-09 DIAGNOSIS — E119 Type 2 diabetes mellitus without complications: Secondary | ICD-10-CM | POA: Diagnosis not present

## 2015-01-05 ENCOUNTER — Encounter: Payer: Self-pay | Admitting: Family Medicine

## 2015-01-05 ENCOUNTER — Ambulatory Visit (INDEPENDENT_AMBULATORY_CARE_PROVIDER_SITE_OTHER): Payer: Medicare Other | Admitting: Family Medicine

## 2015-01-05 VITALS — BP 122/74 | HR 62 | Temp 98.4°F | Ht 64.0 in | Wt 148.8 lb

## 2015-01-05 DIAGNOSIS — E7800 Pure hypercholesterolemia, unspecified: Secondary | ICD-10-CM

## 2015-01-05 DIAGNOSIS — I10 Essential (primary) hypertension: Secondary | ICD-10-CM

## 2015-01-05 DIAGNOSIS — E119 Type 2 diabetes mellitus without complications: Secondary | ICD-10-CM

## 2015-01-05 DIAGNOSIS — L570 Actinic keratosis: Secondary | ICD-10-CM | POA: Diagnosis not present

## 2015-01-05 DIAGNOSIS — D649 Anemia, unspecified: Secondary | ICD-10-CM

## 2015-01-05 LAB — LIPID PANEL
Cholesterol: 209 mg/dL — ABNORMAL HIGH (ref 0–200)
HDL: 29.1 mg/dL — ABNORMAL LOW (ref >39.00)
NonHDL: 179.9
Total CHOL/HDL Ratio: 7
Triglycerides: 358 mg/dL — ABNORMAL HIGH (ref 0.0–149.0)
VLDL: 71.6 mg/dL — ABNORMAL HIGH (ref 0.0–40.0)

## 2015-01-05 LAB — LDL CHOLESTEROL, DIRECT: LDL DIRECT: 111 mg/dL

## 2015-01-05 LAB — CBC WITH DIFFERENTIAL/PLATELET
BASOS ABS: 0 10*3/uL (ref 0.0–0.1)
BASOS PCT: 0.7 % (ref 0.0–3.0)
EOS PCT: 2.2 % (ref 0.0–5.0)
Eosinophils Absolute: 0.1 10*3/uL (ref 0.0–0.7)
HEMATOCRIT: 35.5 % — AB (ref 36.0–46.0)
Hemoglobin: 12.1 g/dL (ref 12.0–15.0)
LYMPHS ABS: 1.4 10*3/uL (ref 0.7–4.0)
Lymphocytes Relative: 26.1 % (ref 12.0–46.0)
MCHC: 34.1 g/dL (ref 30.0–36.0)
MCV: 89.3 fl (ref 78.0–100.0)
Monocytes Absolute: 0.6 10*3/uL (ref 0.1–1.0)
Monocytes Relative: 11.2 % (ref 3.0–12.0)
Neutro Abs: 3.3 10*3/uL (ref 1.4–7.7)
Neutrophils Relative %: 59.8 % (ref 43.0–77.0)
Platelets: 281 10*3/uL (ref 150.0–400.0)
RBC: 3.98 Mil/uL (ref 3.87–5.11)
RDW: 13.3 % (ref 11.5–15.5)
WBC: 5.6 10*3/uL (ref 4.0–10.5)

## 2015-01-05 LAB — FERRITIN: FERRITIN: 163.8 ng/mL (ref 10.0–291.0)

## 2015-01-05 LAB — HEMOGLOBIN A1C: Hgb A1c MFr Bld: 5.8 % (ref 4.6–6.5)

## 2015-01-05 NOTE — Assessment & Plan Note (Signed)
A1C today Wt down 3lb Self reported diet is good

## 2015-01-05 NOTE — Progress Notes (Signed)
Pre visit review using our clinic review tool, if applicable. No additional management support is needed unless otherwise documented below in the visit note. 

## 2015-01-05 NOTE — Patient Instructions (Signed)
Labs today - sugar/cholesterol/blood count  Your medicines should be up to date  Your weight is good  Keep eating a healthy diet low in fat and sugar  Stay active-exercise as much as you can   Follow up in 6 months for annual exam

## 2015-01-05 NOTE — Assessment & Plan Note (Signed)
Lipid today  Trig controlled with fibrate Last LDL not at goal -hesitant to add statin however Rev diet

## 2015-01-05 NOTE — Assessment & Plan Note (Signed)
L hand -tx with liquid nitrogen - freeze and thaw times one Disc aftercare Disc sun protection

## 2015-01-05 NOTE — Assessment & Plan Note (Signed)
bp in fair control at this time  BP Readings from Last 1 Encounters:  01/05/15 122/74   No changes needed Disc lifstyle change with low sodium diet and exercise  meds reviewed F/u 6 mo

## 2015-01-05 NOTE — Assessment & Plan Note (Addendum)
Lab today to follow Chronic and mild Added ferritin today Pt continues to take ferrous sulfate  Has declined further w/u colonosc ok in 2014 -no recall recommended

## 2015-01-05 NOTE — Progress Notes (Signed)
Subjective:    Patient ID: Barbara [name], female    DOB: [birth year], 74 y.o.   MRN: [id number]  HPI Here for f/u of chronic medical conditions   Has been doing well overall  Staying in in the winter  Uses exercise bands for exercise   bp is stable today  No cp or palpitations or headaches or edema  No side effects to medicines  BP Readings from Last 3 Encounters:  01/05/15 122/74  07/01/14 120/70  12/31/13 130/68      DM Lab Results  Component Value Date   HGBA1C 5.8 06/24/2014   Due for a check  Wt is down 3 lb with bmi of 25 -(her wt loss in the past has helped DM control) Has been eating very healthy  Stays away from fatty foods  Does not "use sugar" - she does occ eat candy  No baked goods  Cut back on bread  Tries to drink a lot of water   Chronic anemia -mild and chronic   No fatigue   Cholesterol On lopid - and LDL not at goal but hesitant to add a statin at this time  Lab Results  Component Value Date   CHOL 214* 06/24/2014   HDL 35.60* 06/24/2014   LDLCALC 118* 06/24/2014   LDLDIRECT 134.6 12/23/2013   TRIG 300.0* 06/24/2014   CHOLHDL 6 06/24/2014    Mood is pretty good overall  On zoloft  Patient Active Problem List   Diagnosis Date Noted  . Vaginal atrophy 07/01/2013  . Encounter for Medicare annual wellness exam 07/01/2013  . Hyperkalemia 05/20/2013  . Colon cancer screening 06/26/2012  . Hypothyroid 06/18/2012  . Encounter for routine gynecological examination 02/15/2012  . Anemia 12/26/2011  . Post-menopausal bleeding 12/26/2011  . INTERTRIGO, CANDIDAL 06/01/2010  . Diabetes type 2, controlled 06/05/2007  . HYPERCHOLESTEROLEMIA 06/05/2007  . ANXIETY 06/05/2007  . EXPRESSIVE LANGUAGE DISORDER 06/05/2007  . ISCHEMIA, RETINAL 06/05/2007  . Essential hypertension 06/05/2007  . ALLERGIC RHINITIS 06/05/2007  . ANGIOEDEMA 06/05/2007   Past Medical History  Diagnosis Date  . Allergy   . Anxiety   . Diabetes mellitus   .  Hypertension   . Hyperlipidemia   . GERD (gastroesophageal reflux disease)   . Expressive language disorder   . Retinal ischemia     history of  . Obesity   . Mentally disabled   . Learning disability   . Anemia    Past Surgical History  Procedure Laterality Date  . Cholecystectomy     History  Substance Use Topics  . Smoking status: Never Smoker   . Smokeless tobacco: Never Used  . Alcohol Use: No   Family History  Problem Relation Age of Onset  . Heart disease Father   . Hypertension Father   . Heart disease Brother   . Diabetes Brother    Allergies  Allergen Reactions  . Atorvastatin     REACTION: increased liver fxn tests  . Ezetimibe     REACTION: stomach upset/malaise  . Sulfonamide Derivatives     REACTION: rash   Current Outpatient Prescriptions on File Prior to Visit  Medication Sig Dispense Refill  . acetaminophen (TYLENOL) 500 MG tablet Take 500 mg by mouth every 6 (six) hours as needed.      Marland Kitchen amLODipine (NORVASC) 10 MG tablet Take 1 tablet (10 mg total) by mouth daily. 30 tablet 11  . bisoprolol-hydrochlorothiazide (ZIAC) 10-6.25 MG per tablet Take 1 tablet by mouth 2 (two) times  daily. 60 tablet 11  . clotrimazole-betamethasone (LOTRISONE) cream Apply topically daily as needed.      . ferrous sulfate 325 (65 FE) MG tablet Take 1 tablet (325 mg total) by mouth daily with breakfast.  3  . fexofenadine (ALLEGRA) 180 MG tablet Take 180 mg by mouth as needed.    . fluticasone (FLONASE) 50 MCG/ACT nasal spray USE TWO SPRAYS IN EACH NOSTRIL DAILY 16 g 11  . gemfibrozil (LOPID) 600 MG tablet Take 1 tablet (600 mg total) by mouth 2 (two) times daily. 60 tablet 11  . Glucose Blood DISK by In Vitro route.      Marland Kitchen levothyroxine (SYNTHROID, LEVOTHROID) 100 MCG tablet Take 1 tablet (100 mcg total) by mouth daily. 30 tablet 11  . LUMIGAN 0.01 % SOLN Place 1 drop into the left eye daily.     . metFORMIN (GLUCOPHAGE) 500 MG tablet Take 1 tablet (500 mg total) by mouth  daily with breakfast. 30 tablet 11  . omeprazole (PRILOSEC) 20 MG capsule Take 1 capsule (20 mg total) by mouth daily before breakfast. 30 capsule 11  . quinapril (ACCUPRIL) 40 MG tablet Take 0.5 tablets (20 mg total) by mouth daily. 15 tablet 11  . sertraline (ZOLOFT) 50 MG tablet Take 1 tablet (50 mg total) by mouth daily. 30 tablet 11  . vitamin B-12 (CYANOCOBALAMIN) 1000 MCG tablet Take 1 tablet (1,000 mcg total) by mouth daily.     No current facility-administered medications on file prior to visit.     Review of Systems Review of Systems  Constitutional: Negative for fever, appetite change, fatigue and unexpected weight change.  Eyes: Negative for pain and visual disturbance.  Respiratory: Negative for cough and shortness of breath.   Cardiovascular: Negative for cp or palpitations    Gastrointestinal: Negative for nausea, diarrhea and constipation.  Genitourinary: Negative for urgency and frequency.  Skin: Negative for pallor or rash   Neurological: Negative for weakness, light-headedness, numbness and headaches.  Hematological: Negative for adenopathy. Does not bruise/bleed easily.  Psychiatric/Behavioral: Negative for dysphoric mood. The patient is not nervous/anxious.         Objective:   Physical Exam  Constitutional: She appears well-developed and well-nourished. No distress.  HENT:  Head: Normocephalic and atraumatic.  Mouth/Throat: Oropharynx is clear and moist.  Eyes: Conjunctivae and EOM are normal. Pupils are equal, round, and reactive to light. Right eye exhibits no discharge. Left eye exhibits no discharge. No scleral icterus.  Neck: Normal range of motion. Neck supple. No JVD present. Carotid bruit is not present. No thyromegaly present.  Cardiovascular: Normal rate, regular rhythm, normal heart sounds and intact distal pulses.  Exam reveals no gallop.   Pulmonary/Chest: Effort normal and breath sounds normal. No respiratory distress. She has no wheezes. She has  no rales.  Abdominal: Soft. Bowel sounds are normal. She exhibits no distension and no mass. There is no tenderness.  Musculoskeletal: She exhibits no edema or tenderness.  Lymphadenopathy:    She has no cervical adenopathy.  Neurological: She is alert. She has normal reflexes. No cranial nerve deficit. She exhibits normal muscle tone. Coordination normal.  Skin: Skin is warm and dry. No rash noted. No erythema. No pallor.  .5 cm area of keratosis on back of L hand  tx with cryo spray - freeze and thaw  Tolerated well   Psychiatric: She has a normal mood and affect.  Baseline exp language disorder  Pleasant and cheerful  Assessment & Plan:   Problem List Items Addressed This Visit      Cardiovascular and Mediastinum   Essential hypertension - Primary    bp in fair control at this time  BP Readings from Last 1 Encounters:  01/05/15 122/74   No changes needed Disc lifstyle change with low sodium diet and exercise  meds reviewed F/u 6 mo        Endocrine   Diabetes type 2, controlled    A1C today Wt down 3lb Self reported diet is good        Relevant Orders   Hemoglobin A1c (Completed)     Musculoskeletal and Integument   Keratosis    L hand -tx with liquid nitrogen - freeze and thaw times one Disc aftercare Disc sun protection         Other   Anemia    Lab today to follow Chronic and mild Added ferritin today Pt continues to take ferrous sulfate  Has declined further w/u colonosc ok in 2014 -no recall recommended       Relevant Orders   CBC with Differential/Platelet (Completed)   Ferritin (Completed)   HYPERCHOLESTEROLEMIA    Lipid today  Trig controlled with fibrate Last LDL not at goal -hesitant to add statin however Rev diet       Relevant Orders   Lipid panel (Completed)

## 2015-01-06 ENCOUNTER — Telehealth: Payer: Self-pay | Admitting: Family Medicine

## 2015-01-06 ENCOUNTER — Encounter: Payer: Self-pay | Admitting: *Deleted

## 2015-01-06 NOTE — Telephone Encounter (Signed)
emmi mailed  °

## 2015-03-26 DIAGNOSIS — E119 Type 2 diabetes mellitus without complications: Secondary | ICD-10-CM | POA: Diagnosis not present

## 2015-06-10 ENCOUNTER — Telehealth: Payer: Self-pay | Admitting: *Deleted

## 2015-06-10 NOTE — Telephone Encounter (Signed)
Lm on pts vm advising to schedule mammogram

## 2015-06-15 ENCOUNTER — Encounter: Payer: Self-pay | Admitting: Family Medicine

## 2015-06-15 DIAGNOSIS — E119 Type 2 diabetes mellitus without complications: Secondary | ICD-10-CM | POA: Diagnosis not present

## 2015-06-15 LAB — HM DIABETES EYE EXAM

## 2015-07-05 ENCOUNTER — Telehealth: Payer: Self-pay | Admitting: Family Medicine

## 2015-07-05 DIAGNOSIS — E038 Other specified hypothyroidism: Secondary | ICD-10-CM

## 2015-07-05 DIAGNOSIS — E7800 Pure hypercholesterolemia, unspecified: Secondary | ICD-10-CM

## 2015-07-05 DIAGNOSIS — E119 Type 2 diabetes mellitus without complications: Secondary | ICD-10-CM

## 2015-07-05 DIAGNOSIS — I10 Essential (primary) hypertension: Secondary | ICD-10-CM

## 2015-07-05 NOTE — Telephone Encounter (Signed)
-----   Message from Ellamae Sia sent at 07/01/2015  6:09 PM EDT ----- Regarding: Lab orders for Monday, 8.1.16 Patient is scheduled for CPX labs, please order future labs, Thanks , Karna Christmas   She has orders that just expired

## 2015-07-06 ENCOUNTER — Other Ambulatory Visit (INDEPENDENT_AMBULATORY_CARE_PROVIDER_SITE_OTHER): Payer: Medicare Other

## 2015-07-06 DIAGNOSIS — E7800 Pure hypercholesterolemia, unspecified: Secondary | ICD-10-CM

## 2015-07-06 DIAGNOSIS — E119 Type 2 diabetes mellitus without complications: Secondary | ICD-10-CM | POA: Diagnosis not present

## 2015-07-06 DIAGNOSIS — I10 Essential (primary) hypertension: Secondary | ICD-10-CM

## 2015-07-06 DIAGNOSIS — E038 Other specified hypothyroidism: Secondary | ICD-10-CM

## 2015-07-06 LAB — COMPREHENSIVE METABOLIC PANEL
ALT: 12 U/L (ref 0–35)
AST: 18 U/L (ref 0–37)
Albumin: 4.6 g/dL (ref 3.5–5.2)
Alkaline Phosphatase: 50 U/L (ref 39–117)
BUN: 28 mg/dL — ABNORMAL HIGH (ref 6–23)
CO2: 19 meq/L (ref 19–32)
CREATININE: 0.95 mg/dL (ref 0.40–1.20)
Calcium: 10.1 mg/dL (ref 8.4–10.5)
Chloride: 108 mEq/L (ref 96–112)
GFR: 61.22 mL/min (ref >60.00)
Glucose, Bld: 116 mg/dL — ABNORMAL HIGH (ref 70–99)
Potassium: 5 mEq/L (ref 3.5–5.1)
Sodium: 138 mEq/L (ref 135–145)
Total Bilirubin: 0.4 mg/dL (ref 0.2–1.2)
Total Protein: 7.5 g/dL (ref 6.0–8.3)

## 2015-07-06 LAB — CBC WITH DIFFERENTIAL/PLATELET
BASOS ABS: 0 10*3/uL (ref 0.0–0.1)
Basophils Relative: 0.6 % (ref 0.0–3.0)
Eosinophils Absolute: 0.1 10*3/uL (ref 0.0–0.7)
Eosinophils Relative: 2.6 % (ref 0.0–5.0)
HEMATOCRIT: 35 % — AB (ref 36.0–46.0)
Hemoglobin: 12.2 g/dL (ref 12.0–15.0)
Lymphocytes Relative: 34.2 % (ref 12.0–46.0)
Lymphs Abs: 1.6 10*3/uL (ref 0.7–4.0)
MCHC: 34.8 g/dL (ref 30.0–36.0)
MCV: 90.3 fl (ref 78.0–100.0)
MONO ABS: 0.5 10*3/uL (ref 0.1–1.0)
Monocytes Relative: 11.4 % (ref 3.0–12.0)
Neutro Abs: 2.4 10*3/uL (ref 1.4–7.7)
Neutrophils Relative %: 51.2 % (ref 43.0–77.0)
Platelets: 249 10*3/uL (ref 150.0–400.0)
RBC: 3.87 Mil/uL (ref 3.87–5.11)
RDW: 13.2 % (ref 11.5–15.5)
WBC: 4.7 10*3/uL (ref 4.0–10.5)

## 2015-07-06 LAB — LIPID PANEL
Cholesterol: 215 mg/dL — ABNORMAL HIGH (ref 0–200)
HDL: 32.5 mg/dL — ABNORMAL LOW (ref >39.00)
NonHDL: 182.38
Total CHOL/HDL Ratio: 7
Triglycerides: 282 mg/dL — ABNORMAL HIGH (ref 0.0–149.0)
VLDL: 56.4 mg/dL — ABNORMAL HIGH (ref 0.0–40.0)

## 2015-07-06 LAB — HEMOGLOBIN A1C: HEMOGLOBIN A1C: 5.3 % (ref 4.6–6.5)

## 2015-07-06 LAB — LDL CHOLESTEROL, DIRECT: LDL DIRECT: 112 mg/dL

## 2015-07-06 LAB — TSH: TSH: 3.24 u[IU]/mL (ref 0.35–4.50)

## 2015-07-08 ENCOUNTER — Ambulatory Visit (INDEPENDENT_AMBULATORY_CARE_PROVIDER_SITE_OTHER): Payer: Medicare Other | Admitting: Family Medicine

## 2015-07-08 ENCOUNTER — Encounter: Payer: Self-pay | Admitting: Family Medicine

## 2015-07-08 VITALS — BP 108/66 | HR 55 | Temp 98.4°F | Ht 63.75 in | Wt 144.2 lb

## 2015-07-08 DIAGNOSIS — L989 Disorder of the skin and subcutaneous tissue, unspecified: Secondary | ICD-10-CM | POA: Insufficient documentation

## 2015-07-08 DIAGNOSIS — E7800 Pure hypercholesterolemia, unspecified: Secondary | ICD-10-CM

## 2015-07-08 DIAGNOSIS — E119 Type 2 diabetes mellitus without complications: Secondary | ICD-10-CM | POA: Diagnosis not present

## 2015-07-08 DIAGNOSIS — E039 Hypothyroidism, unspecified: Secondary | ICD-10-CM | POA: Diagnosis not present

## 2015-07-08 DIAGNOSIS — Z0000 Encounter for general adult medical examination without abnormal findings: Secondary | ICD-10-CM | POA: Insufficient documentation

## 2015-07-08 DIAGNOSIS — I10 Essential (primary) hypertension: Secondary | ICD-10-CM

## 2015-07-08 MED ORDER — SERTRALINE HCL 50 MG PO TABS: 50.0000 mg | ORAL_TABLET | Freq: Every day | ORAL | Status: DC

## 2015-07-08 MED ORDER — GEMFIBROZIL 600 MG PO TABS: 600.0000 mg | ORAL_TABLET | Freq: Two times a day (BID) | ORAL | Status: DC

## 2015-07-08 MED ORDER — QUINAPRIL HCL 40 MG PO TABS: 20.0000 mg | ORAL_TABLET | Freq: Every day | ORAL | Status: DC

## 2015-07-08 MED ORDER — AMLODIPINE BESYLATE 10 MG PO TABS: 10.0000 mg | ORAL_TABLET | Freq: Every day | ORAL | Status: DC

## 2015-07-08 MED ORDER — FLUTICASONE PROPIONATE 50 MCG/ACT NA SUSP: NASAL | Status: DC

## 2015-07-08 MED ORDER — METFORMIN HCL 500 MG PO TABS: 500.0000 mg | ORAL_TABLET | Freq: Every day | ORAL | Status: DC

## 2015-07-08 MED ORDER — OMEPRAZOLE 20 MG PO CPDR: 20.0000 mg | DELAYED_RELEASE_CAPSULE | Freq: Every day | ORAL | Status: DC

## 2015-07-08 MED ORDER — BISOPROLOL-HYDROCHLOROTHIAZIDE 10-6.25 MG PO TABS: 1.0000 | ORAL_TABLET | Freq: Two times a day (BID) | ORAL | Status: DC

## 2015-07-08 NOTE — Patient Instructions (Addendum)
Please work on an Advance directive if you do not already have one - take a look at the blue packet I gave you with your guardian  If you change your mind and want a screening mammogram - let us know  If you change your mind and want a screening bone density test please let us know  Stop at check out for referral to dermatology for the spot on your hand   Please update your tetanus shot at the health department since medicare does not pay for them

## 2015-07-08 NOTE — Progress Notes (Signed)
Pre visit review using our clinic review tool, if applicable. No additional management support is needed unless otherwise documented below in the visit note. 

## 2015-07-08 NOTE — Assessment & Plan Note (Signed)
L hand 4-5 mm - former AK now more raised and smooth (but still some scale) Per pt tender/bothersome  Worrisome for squamous cell lesion Ref to derm for eval and tx

## 2015-07-08 NOTE — Assessment & Plan Note (Signed)
Hypothyroidism  Pt has no clinical changes No change in energy level/ hair or skin/ edema and no tremor Lab Results  Component Value Date   TSH 3.24 07/06/2015

## 2015-07-08 NOTE — Assessment & Plan Note (Signed)
Lab Results  Component Value Date   HGBA1C 5.3 07/06/2015   This is improved No hypoglycemia Pt is mt a good weight -better diet and exercise also  utd opthy  Overall doing well

## 2015-07-08 NOTE — Assessment & Plan Note (Signed)
Reviewed health habits including diet and exercise and skin cancer prevention Reviewed appropriate screening tests for age  Also reviewed health mt list, fam hx and immunization status , as well as social and family history   See HPI No gyn c/o Labs reviewed Pt declined mammogram and dexa (urged her to discuss with her guardian) Td orTdap due- enc her to get that at the health dept for better price  Commended good health habits

## 2015-07-08 NOTE — Assessment & Plan Note (Signed)
bp in fair control at this time  BP Readings from Last 1 Encounters:  07/08/15 108/66   No changes needed Disc lifstyle change with low sodium diet and exercise  Doing well with mt her wt loss Labs reviewed

## 2015-07-08 NOTE — Assessment & Plan Note (Signed)
Disc goals for lipids and reasons to control them Rev labs with pt Rev low sat fat diet in detail Triglycerides are still the biggest issue - controlled with improved blood glucose and also lopid Continue this and diet

## 2015-07-08 NOTE — Progress Notes (Signed)
Subjective:    Patient ID: Barbara [name], female    DOB: 08-Jul-1941, 74 y.o.   MRN: [id number]  HPI Here for annual medicare wellness visit as well as chronic/acute medical problems as well as annual preventative exam   I have personally reviewed the Medicare Annual Wellness questionnaire and have noted 1. The patient's medical and social history 2. Their use of alcohol, tobacco or illicit drugs 3. Their current medications and supplements 4. The patient's functional ability including ADL's, fall risks, home safety risks and hearing or visual             impairment. 5. Diet and physical activities 6. Evidence for depression or mood disorders  The patients weight, height, BMI have been recorded in the chart and visual acuity is per eye clinic.  I have made referrals, counseling and provided education to the patient based review of the above and I have provided the pt with a written personalized care plan for preventive services. Reviewed and updated provider list, see scanned forms.  Feeling good-nothing new going on  Had a good eye exam -no retinopathy   See scanned forms.  Routine anticipatory guidance given to patient.  See health maintenance. Colon cancer screening 7/14 -normal  Breast cancer screening- declines mammograms / will discuss further with her guardian  Self breast exam-no lumps or changes  Flu vaccine 10/15  Tetanus vaccine 3/04 - adv to get at the health dept  Pneumovax complete on those  Gyn-no vaginal bleeding at all ,last pap nl 2014, and pt reports gyn did not say she needed to come back  Zoster vaccine 5/13 dexa - declines - she will talk to her guardian about that /only fx was a rib years ago , she takes her calcium and D regularly Had one fall - tripped on something and fell to the side (not hurt)  Advance directive  - does not thing she has one  Cognitive function addressed- see scanned forms- and if abnormal then additional documentation follows. -good  memory, no problems   [facility] and also [facility] a healthy [facility]  Avoids sweets   PMH and SH reviewed  Meds, vitals, and allergies reviewed.   ROS: See HPI.  Otherwise negative.    Wt is down 4 lb with bmi of 24 Thinks this is from the exercise  She still thinks she eats to much and would like to be slimmer    DM2 Lab Results  Component Value Date   HGBA1C 5.3 07/06/2015  very well control, pt reports no hypoglycemia at all  This is down from 5.8 Doing well   bp is stable today  No cp or palpitations or headaches or edema  No side effects to medicines  BP Readings from Last 3 Encounters:  07/08/15 108/66  01/05/15 122/74  07/01/14 120/70     Hypothyroidism  Pt has no clinical changes No change in energy level/ hair or skin/ edema and no tremor Lab Results  Component Value Date   TSH 3.24 07/06/2015      Hyperlipidemia Lab Results  Component Value Date   CHOL 215* 07/06/2015   CHOL 209* 01/05/2015   CHOL 214* 06/24/2014   Lab Results  Component Value Date   HDL 32.50* 07/06/2015   HDL 29.10* 01/05/2015   HDL 35.60* 06/24/2014   Lab Results  Component Value Date   LDLCALC 118* 06/24/2014   Lab Results  Component Value Date   TRIG 282.0* 07/06/2015  TRIG 358.0* 01/05/2015   TRIG 300.0* 06/24/2014   Lab Results  Component Value Date   CHOLHDL 7 07/06/2015   CHOLHDL 7 01/05/2015   CHOLHDL 6 06/24/2014   Lab Results  Component Value Date   LDLDIRECT 112.0 07/06/2015   LDLDIRECT 111.0 01/05/2015   LDLDIRECT 134.6 12/23/2013   on lopid  LDL is not bad  HDL is baseline low but going up with exercise  Will continue lopid and good diet     Chemistry      Component Value Date/Time   NA 138 07/06/2015 0918   K 5.0 07/06/2015 0918   CL 108 07/06/2015 0918   CO2 19 07/06/2015 0918   BUN 28* 07/06/2015 0918   CREATININE 0.95 07/06/2015 0918      Component Value Date/Time   CALCIUM 10.1 07/06/2015 0918   ALKPHOS 50 07/06/2015  0918   AST 18 07/06/2015 0918   ALT 12 07/06/2015 0918   BILITOT 0.4 07/06/2015 0918     ? If she needs to drink more water - at times she drinks more than other times  Lab Results  Component Value Date   WBC 4.7 07/06/2015   HGB 12.2 07/06/2015   HCT 35.0* 07/06/2015   MCV 90.3 07/06/2015   PLT 249.0 07/06/2015      Patient Active Problem List   Diagnosis Date Noted  . Routine general medical examination at a health care facility 07/08/2015  . Keratosis 01/05/2015  . Vaginal atrophy 07/01/2013  . Encounter for Medicare annual wellness exam 07/01/2013  . Hyperkalemia 05/20/2013  . Colon cancer screening 06/26/2012  . Hypothyroid 06/18/2012  . Encounter for routine gynecological examination 02/15/2012  . Anemia 12/26/2011  . Post-menopausal bleeding 12/26/2011  . INTERTRIGO, CANDIDAL 06/01/2010  . Diabetes type 2, controlled 06/05/2007  . HYPERCHOLESTEROLEMIA 06/05/2007  . ANXIETY 06/05/2007  . EXPRESSIVE LANGUAGE DISORDER 06/05/2007  . ISCHEMIA, RETINAL 06/05/2007  . Essential hypertension 06/05/2007  . ALLERGIC RHINITIS 06/05/2007  . ANGIOEDEMA 06/05/2007   Past Medical History  Diagnosis Date  . Allergy   . Anxiety   . Diabetes mellitus   . Hypertension   . Hyperlipidemia   . GERD (gastroesophageal reflux disease)   . Expressive language disorder   . Retinal ischemia     history of  . Obesity   . Mentally disabled   . Learning disability   . Anemia    Past Surgical History  Procedure Laterality Date  . Cholecystectomy     History  Substance Use Topics  . Smoking status: Never Smoker   . Smokeless tobacco: Never Used  . Alcohol Use: No   Family History  Problem Relation Age of Onset  . Heart disease Father   . Hypertension Father   . Heart disease Brother   . Diabetes Brother    Allergies  Allergen Reactions  . Atorvastatin     REACTION: increased liver fxn tests  . Ezetimibe     REACTION: stomach upset/malaise  . Sulfonamide Derivatives       REACTION: rash   Current Outpatient Prescriptions on File Prior to Visit  Medication Sig Dispense Refill  . acetaminophen (TYLENOL) 500 MG tablet Take 500 mg by mouth every 6 (six) hours as needed.      Marland Kitchen amLODipine (NORVASC) 10 MG tablet Take 1 tablet (10 mg total) by mouth daily. 30 tablet 11  . bisoprolol-hydrochlorothiazide (ZIAC) 10-6.25 MG per tablet Take 1 tablet by mouth 2 (two) times daily. 60 tablet 11  .  clotrimazole-betamethasone (LOTRISONE) cream Apply topically daily as needed.      . ferrous sulfate 325 (65 FE) MG tablet Take 1 tablet (325 mg total) by mouth daily with breakfast.  3  . fexofenadine (ALLEGRA) 180 MG tablet Take 180 mg by mouth as needed.    . fluticasone (FLONASE) 50 MCG/ACT nasal spray USE TWO SPRAYS IN EACH NOSTRIL DAILY 16 g 11  . gemfibrozil (LOPID) 600 MG tablet Take 1 tablet (600 mg total) by mouth 2 (two) times daily. 60 tablet 11  . Glucose Blood DISK by In Vitro route.      . metFORMIN (GLUCOPHAGE) 500 MG tablet Take 1 tablet (500 mg total) by mouth daily with breakfast. 30 tablet 11  . omeprazole (PRILOSEC) 20 MG capsule Take 1 capsule (20 mg total) by mouth daily before breakfast. 30 capsule 11  . quinapril (ACCUPRIL) 40 MG tablet Take 0.5 tablets (20 mg total) by mouth daily. 15 tablet 11  . sertraline (ZOLOFT) 50 MG tablet Take 1 tablet (50 mg total) by mouth daily. 30 tablet 11  . vitamin B-12 (CYANOCOBALAMIN) 1000 MCG tablet Take 1 tablet (1,000 mcg total) by mouth daily.     No current facility-administered medications on file prior to visit.    Review of Systems    Review of Systems  Constitutional: Negative for fever, appetite change, fatigue and unexpected weight change.  Eyes: Negative for pain and visual disturbance. (baseline blindness in R eye w/o change) Respiratory: Negative for cough and shortness of breath.   Cardiovascular: Negative for cp or palpitations    Gastrointestinal: Negative for nausea, diarrhea and constipation.   Genitourinary: Negative for urgency and frequency.  Skin: Negative for pallor or rash   Neurological: Negative for weakness, light-headedness, numbness and headaches.  Hematological: Negative for adenopathy. Does not bruise/bleed easily.  Psychiatric/Behavioral: Negative for dysphoric mood. The patient is not nervous/anxious.      Objective:   Physical Exam  Constitutional: She appears well-developed and well-nourished. No distress.  Well appearing   HENT:  Head: Normocephalic and atraumatic.  Right Ear: External ear normal.  Left Ear: External ear normal.  Mouth/Throat: Oropharynx is clear and moist.  Eyes: Conjunctivae and EOM are normal. Pupils are equal, round, and reactive to light. No scleral icterus.  Neck: Normal range of motion. Neck supple. No JVD present. Carotid bruit is not present. No thyromegaly present.  Cardiovascular: Normal rate, regular rhythm, normal heart sounds and intact distal pulses.  Exam reveals no gallop.   Pulmonary/Chest: Effort normal and breath sounds normal. No respiratory distress. She has no wheezes. She exhibits no tenderness.  Abdominal: Soft. Bowel sounds are normal. She exhibits no distension, no abdominal bruit and no mass. There is no tenderness.  Genitourinary: No breast swelling, tenderness, discharge or bleeding.  Breast exam: No mass, nodules, thickening, tenderness, bulging, retraction, inflamation, nipple discharge or skin changes noted.  No axillary or clavicular LA.      Musculoskeletal: Normal range of motion. She exhibits no edema or tenderness.  Lymphadenopathy:    She has no cervical adenopathy.  Neurological: She is alert. She has normal reflexes. No cranial nerve deficit. She exhibits normal muscle tone. Coordination normal.  Skin: Skin is warm and dry. No rash noted. No erythema. No pallor.  Solar lentigo diffusely and solar aging  Former AK on L hand has returned larger and tender- raised 4-5 mm pink and with scale     Psychiatric: She has a normal mood and affect.  Baseline speech  impediment and also learning delay Very pleasant and talkative Her guardian stayed in the waiting room           Assessment & Plan:   Problem List Items Addressed This Visit    Diabetes type 2, controlled    Lab Results  Component Value Date   HGBA1C 5.3 07/06/2015   This is improved No hypoglycemia Pt is mt a good weight -better diet and exercise also  utd opthy  Overall doing well       Relevant Medications   quinapril (ACCUPRIL) 40 MG tablet   metFORMIN (GLUCOPHAGE) 500 MG tablet   bisoprolol-hydrochlorothiazide (ZIAC) 10-6.25 MG per tablet   Encounter for Medicare annual wellness exam    Reviewed health habits including diet and exercise and skin cancer prevention Reviewed appropriate screening tests for age  Also reviewed health mt list, fam hx and immunization status , as well as social and family history   See HPI No gyn c/o Labs reviewed Pt declined mammogram and dexa (urged her to discuss with her guardian) Td orTdap due- enc her to get that at the health dept for better price  Commended good health habits       Essential hypertension - Primary    bp in fair control at this time  BP Readings from Last 1 Encounters:  07/08/15 108/66   No changes needed Disc lifstyle change with low sodium diet and exercise  Doing well with mt her wt loss Labs reviewed       Relevant Medications   quinapril (ACCUPRIL) 40 MG tablet   gemfibrozil (LOPID) 600 MG tablet   bisoprolol-hydrochlorothiazide (ZIAC) 10-6.25 MG per tablet   amLODipine (NORVASC) 10 MG tablet   HYPERCHOLESTEROLEMIA    Disc goals for lipids and reasons to control them Rev labs with pt Rev low sat fat diet in detail Triglycerides are still the biggest issue - controlled with improved blood glucose and also lopid Continue this and diet         Relevant Medications   quinapril (ACCUPRIL) 40 MG tablet   gemfibrozil (LOPID) 600  MG tablet   bisoprolol-hydrochlorothiazide (ZIAC) 10-6.25 MG per tablet   amLODipine (NORVASC) 10 MG tablet   Hypothyroid    Hypothyroidism  Pt has no clinical changes No change in energy level/ hair or skin/ edema and no tremor Lab Results  Component Value Date   TSH 3.24 07/06/2015          Relevant Medications   levothyroxine (SYNTHROID, LEVOTHROID) 100 MCG tablet   Routine general medical examination at a health care facility    Reviewed health habits including diet and exercise and skin cancer prevention Reviewed appropriate screening tests for age  Also reviewed health mt list, fam hx and immunization status , as well as social and family history   See HPI No gyn c/o Labs reviewed Pt declined mammogram and dexa (urged her to discuss with her guardian) Td orTdap due- enc her to get that at the health dept for better price  Commended good health habits       Skin lesion    L hand 4-5 mm - former AK now more raised and smooth (but still some scale) Per pt tender/bothersome  Worrisome for squamous cell lesion Ref to derm for eval and tx       Relevant Orders   Ambulatory referral to Dermatology

## 2015-07-14 ENCOUNTER — Encounter: Payer: Self-pay | Admitting: Family Medicine

## 2015-07-14 NOTE — Telephone Encounter (Signed)
Pt refuses to get mammograms

## 2015-07-29 ENCOUNTER — Other Ambulatory Visit: Payer: Self-pay | Admitting: Family Medicine

## 2015-07-29 NOTE — Telephone Encounter (Signed)
[facility] left v/m requesting 90 day refill levothyroxine with refills. Pt last seen annual exam on 07/08/2015. According to hx med list levothyroxine 100 mcg last filled # 30 x 11 on 12/31/13.spoke with Bea at [facility] and pt has been taking med regularly. Refill done per protocol.

## 2015-08-06 DIAGNOSIS — D0462 Carcinoma in situ of skin of left upper limb, including shoulder: Secondary | ICD-10-CM | POA: Diagnosis not present

## 2016-01-21 DIAGNOSIS — H1820 Unspecified corneal edema: Secondary | ICD-10-CM | POA: Diagnosis not present

## 2016-01-26 DIAGNOSIS — H1820 Unspecified corneal edema: Secondary | ICD-10-CM | POA: Diagnosis not present

## 2016-02-16 DIAGNOSIS — H1820 Unspecified corneal edema: Secondary | ICD-10-CM | POA: Diagnosis not present

## 2016-05-06 ENCOUNTER — Other Ambulatory Visit: Payer: Self-pay | Admitting: Family Medicine

## 2016-06-15 ENCOUNTER — Other Ambulatory Visit: Payer: Self-pay | Admitting: Family Medicine

## 2016-06-15 NOTE — Telephone Encounter (Signed)
No recent or future appts., please advise  

## 2016-06-15 NOTE — Telephone Encounter (Signed)
Left voicemail requesting pt to call office back and schedule a f/u

## 2016-06-15 NOTE — Telephone Encounter (Signed)
Please schedule a f/u and refill until then 

## 2016-06-16 DIAGNOSIS — E119 Type 2 diabetes mellitus without complications: Secondary | ICD-10-CM | POA: Diagnosis not present

## 2016-06-16 LAB — HM DIABETES EYE EXAM

## 2016-06-16 NOTE — Telephone Encounter (Signed)
Pt scheduled for 8/23. Please refill medication

## 2016-06-16 NOTE — Telephone Encounter (Signed)
done

## 2016-06-23 ENCOUNTER — Other Ambulatory Visit: Payer: Self-pay | Admitting: Family Medicine

## 2016-06-30 ENCOUNTER — Other Ambulatory Visit: Payer: Self-pay | Admitting: Family Medicine

## 2016-07-14 ENCOUNTER — Other Ambulatory Visit: Payer: Self-pay | Admitting: Family Medicine

## 2016-07-20 ENCOUNTER — Other Ambulatory Visit: Payer: Self-pay | Admitting: Family Medicine

## 2016-07-27 ENCOUNTER — Encounter: Payer: Self-pay | Admitting: Family Medicine

## 2016-07-27 ENCOUNTER — Ambulatory Visit (INDEPENDENT_AMBULATORY_CARE_PROVIDER_SITE_OTHER): Payer: PPO | Admitting: Family Medicine

## 2016-07-27 VITALS — BP 130/76 | HR 45 | Temp 97.8°F | Ht 63.75 in | Wt 134.5 lb

## 2016-07-27 DIAGNOSIS — E119 Type 2 diabetes mellitus without complications: Secondary | ICD-10-CM

## 2016-07-27 DIAGNOSIS — I10 Essential (primary) hypertension: Secondary | ICD-10-CM | POA: Diagnosis not present

## 2016-07-27 DIAGNOSIS — E7800 Pure hypercholesterolemia, unspecified: Secondary | ICD-10-CM | POA: Diagnosis not present

## 2016-07-27 DIAGNOSIS — Z8619 Personal history of other infectious and parasitic diseases: Secondary | ICD-10-CM

## 2016-07-27 DIAGNOSIS — E039 Hypothyroidism, unspecified: Secondary | ICD-10-CM | POA: Diagnosis not present

## 2016-07-27 LAB — LIPID PANEL
Cholesterol: 197 mg/dL (ref 0–200)
HDL: 36.9 mg/dL — AB (ref >39.00)
NONHDL: 159.63
TRIGLYCERIDES: 201 mg/dL — AB (ref 0.0–149.0)
Total CHOL/HDL Ratio: 5
VLDL: 40.2 mg/dL — ABNORMAL HIGH (ref 0.0–40.0)

## 2016-07-27 LAB — COMPREHENSIVE METABOLIC PANEL
ALK PHOS: 47 U/L (ref 39–117)
ALT: 9 U/L (ref 0–35)
AST: 16 U/L (ref 0–37)
Albumin: 4.7 g/dL (ref 3.5–5.2)
BUN: 29 mg/dL — ABNORMAL HIGH (ref 6–23)
CO2: 26 meq/L (ref 19–32)
Calcium: 9.6 mg/dL (ref 8.4–10.5)
Chloride: 104 mEq/L (ref 96–112)
Creatinine, Ser: 0.9 mg/dL (ref 0.40–1.20)
GFR: 64.97 mL/min (ref >60.00)
GLUCOSE: 117 mg/dL — AB (ref 70–99)
POTASSIUM: 4.3 meq/L (ref 3.5–5.1)
Sodium: 138 mEq/L (ref 135–145)
TOTAL PROTEIN: 7.5 g/dL (ref 6.0–8.3)
Total Bilirubin: 0.4 mg/dL (ref 0.2–1.2)

## 2016-07-27 LAB — CBC WITH DIFFERENTIAL/PLATELET
BASOS PCT: 0.6 % (ref 0.0–3.0)
Basophils Absolute: 0 10*3/uL (ref 0.0–0.1)
EOS PCT: 1.9 % (ref 0.0–5.0)
Eosinophils Absolute: 0.1 10*3/uL (ref 0.0–0.7)
HCT: 34.5 % — ABNORMAL LOW (ref 36.0–46.0)
Hemoglobin: 11.8 g/dL — ABNORMAL LOW (ref 12.0–15.0)
LYMPHS ABS: 1.3 10*3/uL (ref 0.7–4.0)
Lymphocytes Relative: 22 % (ref 12.0–46.0)
MCHC: 34.3 g/dL (ref 30.0–36.0)
MCV: 90.5 fl (ref 78.0–100.0)
MONOS PCT: 9.4 % (ref 3.0–12.0)
Monocytes Absolute: 0.5 10*3/uL (ref 0.1–1.0)
NEUTROS ABS: 3.8 10*3/uL (ref 1.4–7.7)
NEUTROS PCT: 66.1 % (ref 43.0–77.0)
PLATELETS: 257 10*3/uL (ref 150.0–400.0)
RBC: 3.81 Mil/uL — ABNORMAL LOW (ref 3.87–5.11)
RDW: 13.3 % (ref 11.5–15.5)
WBC: 5.7 10*3/uL (ref 4.0–10.5)

## 2016-07-27 LAB — TSH: TSH: 1.84 u[IU]/mL (ref 0.35–4.50)

## 2016-07-27 LAB — HEMOGLOBIN A1C: Hgb A1c MFr Bld: 5.2 % (ref 4.6–6.5)

## 2016-07-27 LAB — LDL CHOLESTEROL, DIRECT: Direct LDL: 113 mg/dL

## 2016-07-27 MED ORDER — AMLODIPINE BESYLATE 10 MG PO TABS: 10.0000 mg | ORAL_TABLET | Freq: Every day | ORAL | 3 refills | Status: DC

## 2016-07-27 MED ORDER — SERTRALINE HCL 50 MG PO TABS: 50.0000 mg | ORAL_TABLET | Freq: Every day | ORAL | 3 refills | Status: DC

## 2016-07-27 MED ORDER — QUINAPRIL HCL 40 MG PO TABS: 20.0000 mg | ORAL_TABLET | Freq: Every day | ORAL | 3 refills | Status: DC

## 2016-07-27 MED ORDER — BISOPROLOL-HYDROCHLOROTHIAZIDE 10-6.25 MG PO TABS: 1.0000 | ORAL_TABLET | Freq: Two times a day (BID) | ORAL | 3 refills | Status: DC

## 2016-07-27 MED ORDER — LEVOTHYROXINE SODIUM 100 MCG PO TABS: 100.0000 ug | ORAL_TABLET | Freq: Every day | ORAL | 3 refills | Status: DC

## 2016-07-27 MED ORDER — OMEPRAZOLE 20 MG PO CPDR: DELAYED_RELEASE_CAPSULE | ORAL | 3 refills | Status: DC

## 2016-07-27 MED ORDER — METFORMIN HCL 500 MG PO TABS: 500.0000 mg | ORAL_TABLET | Freq: Every day | ORAL | 3 refills | Status: DC

## 2016-07-27 MED ORDER — GEMFIBROZIL 600 MG PO TABS: 600.0000 mg | ORAL_TABLET | Freq: Two times a day (BID) | ORAL | 3 refills | Status: DC

## 2016-07-27 MED ORDER — FLUTICASONE PROPIONATE 50 MCG/ACT NA SUSP: NASAL | 3 refills | Status: DC

## 2016-07-27 NOTE — Progress Notes (Signed)
Pre visit review using our clinic review tool, if applicable. No additional management support is needed unless otherwise documented below in the visit note. 

## 2016-07-27 NOTE — Patient Instructions (Addendum)
You continue to loose weight- you no longer need to cut back to loose weight  The staff up front will make you an appointment with one of our nurses for your yearly medicare interview  Take care of yourself  Get your flu shot at [facility] this fall  Labs today

## 2016-07-27 NOTE — Assessment & Plan Note (Signed)
Lab today No clinical changes except wt loss (pt states is intentional)

## 2016-07-27 NOTE — Assessment & Plan Note (Signed)
Lab today Pt has further wt loss- she states it is intentional and does not sound like she eats a lot of carbs

## 2016-07-27 NOTE — Progress Notes (Signed)
Subjective:    Patient ID: Barbara [name], female    DOB: [birth year], 75 y.o.   MRN: [id number]  HPI  Here for an appt for medication refills and f/u of chronic medical problems   Doing well overall  A little sore throat this am   Wt Readings from Last 3 Encounters:  07/27/16 134 lb 8 oz (61 kg)  07/08/15 144 lb 4 oz (65.4 kg)  01/05/15 148 lb 12.8 oz (67.5 kg)  she states she is trying to loose weight  She walks daily - ? How many minutes [facility]  She is eating well and trying to cut back   Breakfast- egg/sausage Beans for lunch  For dinner baloney - with light bread  Does eat fruits and vegetables   Pt continues to loose weight  bmi is 23.2  bp is stable today  Compliant with all medicines  No cp or palpitations or headaches or edema  No side effects to medicines  BP Readings from Last 3 Encounters:  07/27/16 130/76  07/08/15 108/66  01/05/15 122/74     Due for TSH for hypothyroid history- on levothyroxine 100 mcg daily Lab Results  Component Value Date   TSH 3.24 07/06/2015  missed one thyroid pill yesterday-only had one left    Due for cholesterol Hx of hyperlipidemia On gemfibrozil for her triglycerides Diet Lab Results  Component Value Date   CHOL 215 (H) 07/06/2015   HDL 32.50 (L) 07/06/2015   LDLCALC 118 (H) 06/24/2014   LDLDIRECT 112.0 07/06/2015   TRIG 282.0 (H) 07/06/2015   CHOLHDL 7 07/06/2015     Hx of DM Metformin and diet  Further wt loss noted  Not a lot of sugar  Drinks juice- small glass in the am - (? Mixed with tea)- not sure   Allergies are not bad   Had shingles 6 mo ago - given medicine by her dermatologist    Patient Active Problem List   Diagnosis Date Noted  . History of shingles 07/27/2016  . Routine general medical examination at a health care facility 07/08/2015  . Vaginal atrophy 07/01/2013  . Encounter for Medicare annual wellness exam 07/01/2013  . Colon cancer screening 06/26/2012  .  Hypothyroid 06/18/2012  . Encounter for routine gynecological examination 02/15/2012  . INTERTRIGO, CANDIDAL 06/01/2010  . Diabetes type 2, controlled ([place]) 06/05/2007  . HYPERCHOLESTEROLEMIA 06/05/2007  . ANXIETY 06/05/2007  . EXPRESSIVE LANGUAGE DISORDER 06/05/2007  . ISCHEMIA, RETINAL 06/05/2007  . Essential hypertension 06/05/2007  . ALLERGIC RHINITIS 06/05/2007  . ANGIOEDEMA 06/05/2007   Past Medical History:  Diagnosis Date  . Allergy   . Anemia   . Anxiety   . Diabetes mellitus   . Expressive language disorder   . GERD (gastroesophageal reflux disease)   . Hyperlipidemia   . Hypertension   . Learning disability   . Mentally disabled   . Obesity   . Retinal ischemia    history of   Past Surgical History:  Procedure Laterality Date  . CHOLECYSTECTOMY     Social History  Substance Use Topics  . Smoking status: Never Smoker  . Smokeless tobacco: Never Used  . Alcohol use No   Family History  Problem Relation Age of Onset  . Heart disease Father   . Hypertension Father   . Heart disease Brother   . Diabetes Brother    Allergies  Allergen Reactions  . Atorvastatin     REACTION: increased liver fxn  tests  . Ezetimibe     REACTION: stomach upset/malaise  . Sulfonamide Derivatives     REACTION: rash   Current Outpatient Prescriptions on File Prior to Visit  Medication Sig Dispense Refill  . acetaminophen (TYLENOL) 500 MG tablet Take 500 mg by mouth every 6 (six) hours as needed.      . clotrimazole-betamethasone (LOTRISONE) cream Apply topically daily as needed.      . ferrous sulfate 325 (65 FE) MG tablet Take 1 tablet (325 mg total) by mouth daily with breakfast.  3  . fexofenadine (ALLEGRA) 180 MG tablet Take 180 mg by mouth as needed.    . Glucose Blood DISK by In Vitro route.      . vitamin B-12 (CYANOCOBALAMIN) 1000 MCG tablet Take 1 tablet (1,000 mcg total) by mouth daily.     No current facility-administered medications on file prior to visit.        Review of Systems    Review of Systems  Constitutional: Negative for fever, appetite change, fatigue and unexpected weight change.  Eyes: Negative for pain and baseline for loss of vision in R eye ENT pos for baseline speech impediment  Respiratory: Negative for cough and shortness of breath.   Cardiovascular: Negative for cp or palpitations    Gastrointestinal: Negative for nausea, diarrhea and constipation.  Genitourinary: Negative for urgency and frequency.  Skin: Negative for pallor or rash   Neurological: Negative for weakness, light-headedness, numbness and headaches.  Hematological: Negative for adenopathy. Does not bruise/bleed easily.  Psychiatric/Behavioral: Negative for dysphoric mood. The patient is not nervous/anxious.      Objective:   Physical Exam  Constitutional: She appears well-developed and well-nourished. No distress.  Well appearing   HENT:  Head: Normocephalic and atraumatic.  Mouth/Throat: Oropharynx is clear and moist.  Baseline speech impediment-hard to understand her at times   Eyes: Conjunctivae and EOM are normal.  Baseline abn R eye/pupil with loss of vision/ no change  Neck: Normal range of motion. Neck supple. No JVD present. Carotid bruit is not present. No thyromegaly present.  Cardiovascular: Normal rate, regular rhythm, normal heart sounds and intact distal pulses.  Exam reveals no gallop.   Pulmonary/Chest: Effort normal and breath sounds normal. No respiratory distress. She has no wheezes. She has no rales.  No crackles  Abdominal: Soft. Bowel sounds are normal. She exhibits no distension, no abdominal bruit and no mass. There is no tenderness.  Musculoskeletal: She exhibits no edema or tenderness.  Lymphadenopathy:    She has no cervical adenopathy.  Neurological: She is alert. She has normal reflexes. No cranial nerve deficit. She exhibits normal muscle tone. Coordination normal.  Skin: Skin is warm and dry. No rash noted.  Psychiatric:  She has a normal mood and affect.  Cheerful today - pleasant and talkative           Assessment & Plan:   Problem List Items Addressed This Visit      Cardiovascular and Mediastinum   Essential hypertension - Primary    bp in fair control at this time  BP Readings from Last 1 Encounters:  07/27/16 130/76   No changes needed Disc lifstyle change with low sodium diet and exercise  Labs today        Relevant Medications   gemfibrozil (LOPID) 600 MG tablet   quinapril (ACCUPRIL) 40 MG tablet   bisoprolol-hydrochlorothiazide (ZIAC) 10-6.25 MG tablet   amLODipine (NORVASC) 10 MG tablet   Other Relevant Orders   CBC  with Differential/Platelet   Comprehensive metabolic panel   TSH   Lipid panel     Endocrine   Hypothyroid    Lab today No clinical changes except wt loss (pt states is intentional)      Relevant Medications   levothyroxine (SYNTHROID, LEVOTHROID) 100 MCG tablet   Other Relevant Orders   TSH   Diabetes type 2, controlled ([name])    Lab today Pt has further wt loss- she states it is intentional and does not sound like she eats a lot of carbs        Relevant Medications   metFORMIN (GLUCOPHAGE) 500 MG tablet   quinapril (ACCUPRIL) 40 MG tablet   bisoprolol-hydrochlorothiazide (ZIAC) 10-6.25 MG tablet   Other Relevant Orders   Hemoglobin A1c     Other   HYPERCHOLESTEROLEMIA    Disc goals for lipids and reasons to control them Rev labs with pt (last draw) Rev low sat fat diet in detail She takes lopid for triglycerides  Lab today       Relevant Medications   gemfibrozil (LOPID) 600 MG tablet   quinapril (ACCUPRIL) 40 MG tablet   bisoprolol-hydrochlorothiazide (ZIAC) 10-6.25 MG tablet   amLODipine (NORVASC) 10 MG tablet   Other Relevant Orders   Lipid panel   History of shingles    Pt mentions she had a mild case 6 mo ago  Better now        Other Visit Diagnoses   None.

## 2016-07-27 NOTE — Assessment & Plan Note (Signed)
Pt mentions she had a mild case 6 mo ago  Better now

## 2016-07-27 NOTE — Assessment & Plan Note (Signed)
Disc goals for lipids and reasons to control them Rev labs with pt (last draw) Rev low sat fat diet in detail She takes lopid for triglycerides  Lab today

## 2016-07-27 NOTE — Assessment & Plan Note (Signed)
bp in fair control at this time  BP Readings from Last 1 Encounters:  07/27/16 130/76   No changes needed Disc lifstyle change with low sodium diet and exercise  Labs today

## 2016-07-27 NOTE — Addendum Note (Signed)
Addended by: Tammi Sou on: 07/27/2016 12:15 PM   Modules accepted: Orders

## 2016-07-28 ENCOUNTER — Encounter: Payer: Self-pay | Admitting: *Deleted

## 2016-08-16 DIAGNOSIS — B354 Tinea corporis: Secondary | ICD-10-CM | POA: Diagnosis not present

## 2016-08-16 DIAGNOSIS — D1801 Hemangioma of skin and subcutaneous tissue: Secondary | ICD-10-CM | POA: Diagnosis not present

## 2016-08-16 DIAGNOSIS — D235 Other benign neoplasm of skin of trunk: Secondary | ICD-10-CM | POA: Diagnosis not present

## 2016-08-16 DIAGNOSIS — L570 Actinic keratosis: Secondary | ICD-10-CM | POA: Diagnosis not present

## 2016-08-16 DIAGNOSIS — L821 Other seborrheic keratosis: Secondary | ICD-10-CM | POA: Diagnosis not present

## 2016-08-16 DIAGNOSIS — L814 Other melanin hyperpigmentation: Secondary | ICD-10-CM | POA: Diagnosis not present

## 2016-08-16 DIAGNOSIS — D485 Neoplasm of uncertain behavior of skin: Secondary | ICD-10-CM | POA: Diagnosis not present

## 2016-08-16 DIAGNOSIS — L820 Inflamed seborrheic keratosis: Secondary | ICD-10-CM | POA: Diagnosis not present

## 2016-08-16 DIAGNOSIS — D225 Melanocytic nevi of trunk: Secondary | ICD-10-CM | POA: Diagnosis not present

## 2016-08-30 ENCOUNTER — Ambulatory Visit (INDEPENDENT_AMBULATORY_CARE_PROVIDER_SITE_OTHER): Payer: PPO

## 2016-08-30 VITALS — BP 118/60 | HR 43 | Temp 97.9°F | Ht 63.75 in | Wt 139.0 lb

## 2016-08-30 DIAGNOSIS — Z0000 Encounter for general adult medical examination without abnormal findings: Secondary | ICD-10-CM | POA: Diagnosis not present

## 2016-08-30 DIAGNOSIS — Z23 Encounter for immunization: Secondary | ICD-10-CM | POA: Diagnosis not present

## 2016-08-30 NOTE — Progress Notes (Signed)
Pre visit review using our clinic review tool, if applicable. No additional management support is needed unless otherwise documented below in the visit note. 

## 2016-08-30 NOTE — Patient Instructions (Addendum)
Barbara [name] , Thank you for taking time to come for your Medicare Wellness Visit. I appreciate your ongoing commitment to your health goals. Please review the following plan we discussed and let me know if I can assist you in the future.   These are the goals we discussed: Goals    . Increase physical activity          Starting 08/30/2016, I will continue to exercise for at least 60 min 2 days per week.        This is a list of the screening recommended for you and due dates:  Health Maintenance  Topic Date Due  . Pap Smear  09/13/2016*  . Mammogram  12/04/2016*  . DEXA scan (bone density measurement)  12/04/2016*  . Hemoglobin A1C  01/27/2017  . Eye exam for diabetics  06/16/2017  . Complete foot exam   07/27/2017  . Tetanus Vaccine  02/06/2018  . Flu Shot  Completed  . Shingles Vaccine  Completed  . Pneumonia vaccines  Completed  *Topic was postponed. The date shown is not the original due date.   Preventive Care for Adults  A healthy lifestyle and preventive care can promote health and wellness. Preventive health guidelines for adults include the following key practices.  . A routine yearly physical is a good way to check with your health care provider about your health and preventive screening. It is a chance to share any concerns and updates on your health and to receive a thorough exam.  . Visit your dentist for a routine exam and preventive care every 6 months. Brush your teeth twice a day and floss once a day. Good oral hygiene prevents tooth decay and gum disease.  . The frequency of eye exams is based on your age, health, family medical history, use  of contact lenses, and other factors. Follow your health care provider's ecommendations for frequency of eye exams.  . Eat a healthy diet. Foods like vegetables, fruits, whole grains, low-fat dairy products, and lean protein foods contain the nutrients you need without too many calories. Decrease your intake of foods high in  solid fats, added sugars, and salt. Eat the right amount of calories for you. Get information about a proper diet from your health care provider, if necessary.  . Regular physical exercise is one of the most important things you can do for your health. Most adults should get at least 150 minutes of moderate-intensity exercise (any activity that increases your heart rate and causes you to sweat) each week. In addition, most adults need muscle-strengthening exercises on 2 or more days a week.  Silver Sneakers may be a benefit available to you. To determine eligibility, you may visit the website: www.silversneakers.com or contact program at 684-406-3869 Mon-Fri between 8AM-8PM.   . Maintain a healthy weight. The body mass index (BMI) is a screening tool to identify possible weight problems. It provides an estimate of body fat based on height and weight. Your health care provider can find your BMI and can help you achieve or maintain a healthy weight.   For adults 20 years and older: ? A BMI below 18.5 is considered underweight. ? A BMI of 18.5 to 24.9 is normal. ? A BMI of 25 to 29.9 is considered overweight. ? A BMI of 30 and above is considered obese.   . Maintain normal blood lipids and cholesterol levels by exercising and minimizing your intake of saturated fat. Eat a balanced diet with plenty of fruit  and vegetables. Blood tests for lipids and cholesterol should begin at age 34 and be repeated every 5 years. If your lipid or cholesterol levels are high, you are over 50, or you are at high risk for heart disease, you may need your cholesterol levels checked more frequently. Ongoing high lipid and cholesterol levels should be treated with medicines if diet and exercise are not working.  . If you smoke, find out from your health care provider how to quit. If you do not use tobacco, please do not start.  . If you choose to drink alcohol, please do not consume more than 2 drinks per day. One drink  is considered to be 12 ounces (355 mL) of beer, 5 ounces (148 mL) of wine, or 1.5 ounces (44 mL) of liquor.  . If you are 75-50 years old, ask your health care provider if you should take aspirin to prevent strokes.  . Use sunscreen. Apply sunscreen liberally and repeatedly throughout the day. You should seek shade when your shadow is shorter than you. Protect yourself by wearing long sleeves, pants, a wide-brimmed hat, and sunglasses year round, whenever you are outdoors.  . Once a month, do a whole body skin exam, using a mirror to look at the skin on your back. Tell your health care provider of new moles, moles that have irregular borders, moles that are larger than a pencil eraser, or moles that have changed in shape or color.     Bone Densitometry Bone densitometry is an imaging test that uses a special X-ray to measure the amount of calcium and other minerals in your bones (bone density). This test is also known as a bone mineral density test or dual-energy X-ray absorptiometry (DXA). The test can measure bone density at your hip and your spine. It is similar to having a regular X-ray. You may have this test to:  Diagnose a condition that causes weak or thin bones (osteoporosis).  Predict your risk of a broken bone (fracture).  Determine how well osteoporosis treatment is working. LET [facility] CARE PROVIDER KNOW ABOUT:  Any allergies you have.  All medicines you are taking, including vitamins, herbs, eye drops, creams, and over-the-counter medicines.  Previous problems you or members of your family have had with the use of anesthetics.  Any blood disorders you have.  Previous surgeries you have had.  Medical conditions you have.  Possibility of pregnancy.  Any other medical test you had within the previous 14 days that used contrast material. RISKS AND COMPLICATIONS Generally, this is a safe procedure. However, problems can occur and may include the following:  This  test exposes you to a very small amount of radiation.  The risks of radiation exposure may be greater to unborn children. BEFORE THE PROCEDURE  Do not take any calcium supplements for 24 hours before having the test. You can otherwise eat and drink what you usually do.  Take off all metal jewelry, eyeglasses, dental appliances, and any other metal objects. PROCEDURE  You may lie on an exam table. There will be an X-ray generator below you and an imaging device above you.  Other devices, such as boxes or braces, may be used to position your body properly for the scan.  You will need to lie still while the machine slowly scans your body.  The images will show up on a computer monitor. AFTER THE PROCEDURE You may need more testing at a later time.   This information is not intended  to replace advice given to you by your health care provider. Make sure you discuss any questions you have with your health care provider.   Document Released: 12/13/2004 Document Revised: 12/12/2014 Document Reviewed: 05/01/2014 Elsevier Interactive Patient Education 2016 [place] A mammogram is an X-ray of the breasts that is done to check for abnormal changes. This procedure can screen for and detect any changes that may suggest breast cancer. A mammogram can also identify other changes and variations in the breast, such as:  Inflammation of the breast tissue (mastitis).  An infected area that contains a collection of pus (abscess).  A fluid-filled sac (cyst).  Fibrocystic changes. This is when breast tissue becomes denser, which can make the tissue feel rope-like or uneven under the skin.  Tumors that are not cancerous (benign). LET [facility] CARE PROVIDER KNOW ABOUT:  Any allergies you have.  If you have breast implants.  If you have had previous breast disease, biopsy, or surgery.  If you are breastfeeding.  Any possibility that you could be pregnant, if this  applies.  If you are younger than age 39.  If you have a family history of breast cancer. RISKS AND COMPLICATIONS Generally, this is a safe procedure. However, problems may occur, including:  Exposure to radiation. Radiation levels are very low with this test.  The results being misinterpreted.  The need for further tests.  The inability of the mammogram to detect certain cancers. BEFORE THE PROCEDURE  Schedule your test about 1-2 weeks after your menstrual period. This is usually when your breasts are the least tender.  If you have had a mammogram done at a different facility in the past, get the mammogram X-rays or have them sent to your current exam facility in order to compare them.  Wash your breasts and under your arms the day of the test.  Do not wear deodorants, perfumes, lotions, or powders anywhere on your body on the day of the test.  Remove any jewelry from your neck.  Wear clothes that you can change into and out of easily. PROCEDURE  You will undress from the waist up and put on a gown.  You will stand in front of the X-ray machine.  Each breast will be placed between two plastic or glass plates. The plates will compress your breast for a few seconds. Try to stay as relaxed as possible during the procedure. This does not cause any harm to your breasts and any discomfort you feel will be very brief.  X-rays will be taken from different angles of each breast. The procedure may vary among health care providers and hospitals. AFTER THE PROCEDURE  The mammogram will be examined by a specialist (radiologist).  You may need to repeat certain parts of the test, depending on the quality of the images. This is commonly done if the radiologist needs a better view of the breast tissue.  Ask when your test results will be ready. Make sure you get your test results.  You may resume your normal activities.   This information is not intended to replace advice given to  you by your health care provider. Make sure you discuss any questions you have with your health care provider.   Document Released: 11/18/2000 Document Revised: 08/12/2015 Document Reviewed: 01/30/2015 Elsevier Interactive Patient Education [employer].

## 2016-08-30 NOTE — Progress Notes (Signed)
PCP notes:   Health maintenance:  Flu vaccine - administered Pap Smear - pt would like a well woman check at next appt Mammogram - pls schedule after physical assmt  Bone density - pls schedule with mammogram   Abnormal screenings:   Hearing - failed  Mini-Cog score: 18/20 Fall risk - hx of fall without injury  Patient concerns:   None  Nurse concerns:  None  Next PCP appt:   I reviewed health advisor's note, was available for consultation, and agree with documentation and plan. Loura Pardon MD   09/13/16 @ 0900

## 2016-08-30 NOTE — Progress Notes (Signed)
Subjective:   Barbara [name] is a 75 y.o. female who presents for Medicare Annual (Subsequent) preventive examination.  Review of Systems:  N/A Cardiac Risk Factors include: advanced age (>43men, >36 women);diabetes mellitus;dyslipidemia;hypertension     Objective:     Vitals: BP 118/60 (BP Location: Right Arm, Patient Position: Sitting, Cuff Size: Normal)   Pulse (!) 43   Temp 97.9 F (36.6 C) (Oral)   Ht 5' 3.75" (1.619 m)   Wt 139 lb (63 kg)   SpO2 98%   BMI 24.05 kg/m   Body mass index is 24.05 kg/m.   Tobacco History  Smoking Status  . Never Smoker  Smokeless Tobacco  . Never Used     Counseling given: No   Past Medical History:  Diagnosis Date  . Allergy   . Anemia   . Anxiety   . Diabetes mellitus   . Expressive language disorder   . GERD (gastroesophageal reflux disease)   . Hyperlipidemia   . Hypertension   . Learning disability   . Mentally disabled   . Obesity   . Retinal ischemia    history of   Past Surgical History:  Procedure Laterality Date  . CATARACT EXTRACTION Left 2014  . CHOLECYSTECTOMY     Family History  Problem Relation Age of Onset  . Heart disease Father   . Hypertension Father   . Heart disease Brother   . Diabetes Brother    History  Sexual Activity  . Sexual activity: No    Outpatient Encounter Prescriptions as of 08/30/2016  Medication Sig  . acetaminophen (TYLENOL) 500 MG tablet Take 500 mg by mouth every 6 (six) hours as needed.    Marland Kitchen amLODipine (NORVASC) 10 MG tablet Take 1 tablet (10 mg total) by mouth daily.  . bisoprolol-hydrochlorothiazide (ZIAC) 10-6.25 MG tablet Take 1 tablet by mouth 2 (two) times daily.  . clotrimazole-betamethasone (LOTRISONE) cream Apply topically daily as needed.    . ferrous sulfate 325 (65 FE) MG tablet Take 1 tablet (325 mg total) by mouth daily with breakfast.  . fexofenadine (ALLEGRA) 180 MG tablet Take 180 mg by mouth as needed.  . fluticasone (FLONASE) 50 MCG/ACT nasal  spray USE TWO SPRAYS IN EACH NOSTRIL DAILY  . gemfibrozil (LOPID) 600 MG tablet Take 1 tablet (600 mg total) by mouth 2 (two) times daily.  . Glucose Blood DISK by In Vitro route.    Marland Kitchen levothyroxine (SYNTHROID, LEVOTHROID) 100 MCG tablet Take 1 tablet (100 mcg total) by mouth daily.  . metFORMIN (GLUCOPHAGE) 500 MG tablet Take 1 tablet (500 mg total) by mouth daily with breakfast.  . omeprazole (PRILOSEC) 20 MG capsule TAKE ONE CAPSULE BY MOUTH DAILY BEFORE BREAKFAST  . quinapril (ACCUPRIL) 40 MG tablet Take 0.5 tablets (20 mg total) by mouth daily.  . sertraline (ZOLOFT) 50 MG tablet Take 1 tablet (50 mg total) by mouth daily.  . vitamin B-12 (CYANOCOBALAMIN) 1000 MCG tablet Take 1 tablet (1,000 mcg total) by mouth daily.   No facility-administered encounter medications on file as of 08/30/2016.     Activities of Daily Living In your present state of health, do you have any difficulty performing the following activities: 08/30/2016  Hearing? N  Vision? Y  Difficulty concentrating or making decisions? N  Walking or climbing stairs? N  Dressing or bathing? N  Doing errands, shopping? Y  Preparing Food and eating ? N  Using the Toilet? N  In the past six months, have you accidently leaked  urine? N  Do you have problems with loss of bowel control? N  Managing your Medications? N  Managing your Finances? N  Housekeeping or managing your Housekeeping? N  Some recent data might be hidden    Patient Care Team: Abner Greenspan, MD as PCP - General Druscilla Brownie, MD as Consulting Physician (Dermatology)    Assessment:     Hearing Screening   125Hz  250Hz  500Hz  1000Hz  2000Hz  3000Hz  4000Hz  6000Hz  8000Hz   Right ear:   40 40 40  0    Left ear:   40 40 40  0    Vision Screening Comments: Last vision exam with Dr. Wallace Going in July 2017   Exercise Activities and Dietary recommendations Current Exercise Habits: Home exercise routine, Type of exercise: treadmill;walking, Time (Minutes):  60, Frequency (Times/Week): 2, Weekly Exercise (Minutes/Week): 120, Intensity: Mild, Exercise limited by: None identified  Goals    . Increase physical activity          Starting 08/30/2016, I will continue to exercise for at least 60 min 2 days per week.       Fall Risk Fall Risk  08/30/2016 07/08/2015 07/01/2014 07/01/2014  Falls in the past year? Yes Yes - Yes  Number falls in past yr: 1 1 - 1  Injury with Fall? No No No No  Risk for fall due to : - - Impaired vision -  Follow up Falls evaluation completed - - -   Depression Screen PHQ 2/9 Scores 08/30/2016 07/08/2015 07/01/2014  PHQ - 2 Score 0 0 0     Cognitive Testing MMSE - Mini Mental State Exam 08/30/2016  Orientation to time 3  Orientation to time comments pt was unable to provide correct year despite multiple cues  Orientation to Place 5  Registration 3  Attention/ Calculation 0  Recall 3  Language- name 2 objects 0  Language- repeat 1  Language- follow 3 step command 3  Language- read & follow direction 0  Write a sentence 0  Copy design 0  Total score 18   PLEASE NOTE: A Mini-Cog screen was completed. Maximum score is 20. A value of 0 denotes this part of [place] MMSE was not completed or the patient failed this part of the Mini-Cog screening.   Mini-Cog Screening Orientation to Time - Max 5 pts Orientation to Place - Max 5 pts Registration - Max 3 pts Recall - Max 3 pts Language Repeat - Max 1 pts Language Follow 3 Step Command - Max 3 pts  Immunization History  Administered Date(s) Administered  . Influenza Split 12/04/2011, 09/28/2012  . Influenza Whole 09/08/2009, 09/08/2010  . Influenza,inj,Quad PF,36+ Mos 08/30/2016  . Influenza-Unspecified 10/11/2013, 09/24/2014, 10/21/2015  . Pneumococcal Conjugate-13 07/01/2014  . Pneumococcal Polysaccharide-23 12/08/2009  . Td 02/07/2008  . Zoster 04/18/2012   Screening Tests Health Maintenance  Topic Date Due  . PAP SMEAR  09/13/2016 (Originally 07/02/2015)    . MAMMOGRAM  12/04/2016 (Originally 09/04/2001)  . DEXA SCAN  12/04/2016 (Originally 02/13/2007)  . HEMOGLOBIN A1C  01/27/2017  . OPHTHALMOLOGY EXAM  06/16/2017  . FOOT EXAM  07/27/2017  . TETANUS/TDAP  02/06/2018  . INFLUENZA VACCINE  Completed  . ZOSTAVAX  Completed  . PNA vac Low Risk Adult  Completed      Plan:   I have personally reviewed and addressed the Medicare Annual Wellness questionnaire and have noted the following in the patient's chart:  A. Medical and social history B. Use of alcohol, tobacco or illicit drugs  C. Current medications and supplements D. Functional ability and status E.  Nutritional status F.  Physical activity G. Advance directives H. List of other physicians I.  Hospitalizations, surgeries, and ER visits in previous 12 months J.  [place] to include hearing, vision, cognitive, depression L. Referrals and appointments - none  In addition, I have reviewed and discussed with patient certain preventive protocols, quality metrics, and best practice recommendations. A written personalized care plan for preventive services as well as general preventive health recommendations were provided to patient.  See attached scanned questionnaire for additional information.   Signed,   Lindell Noe, MHA, BS, LPN Health Advisor

## 2016-09-13 ENCOUNTER — Encounter: Payer: Self-pay | Admitting: Family Medicine

## 2016-09-13 ENCOUNTER — Ambulatory Visit (INDEPENDENT_AMBULATORY_CARE_PROVIDER_SITE_OTHER): Payer: PPO | Admitting: Family Medicine

## 2016-09-13 VITALS — BP 125/70 | HR 71 | Ht 63.75 in | Wt 139.0 lb

## 2016-09-13 DIAGNOSIS — E7800 Pure hypercholesterolemia, unspecified: Secondary | ICD-10-CM

## 2016-09-13 DIAGNOSIS — Z0000 Encounter for general adult medical examination without abnormal findings: Secondary | ICD-10-CM | POA: Diagnosis not present

## 2016-09-13 DIAGNOSIS — I10 Essential (primary) hypertension: Secondary | ICD-10-CM

## 2016-09-13 DIAGNOSIS — E038 Other specified hypothyroidism: Secondary | ICD-10-CM

## 2016-09-13 DIAGNOSIS — Z1231 Encounter for screening mammogram for malignant neoplasm of breast: Secondary | ICD-10-CM

## 2016-09-13 DIAGNOSIS — E119 Type 2 diabetes mellitus without complications: Secondary | ICD-10-CM

## 2016-09-13 DIAGNOSIS — E2839 Other primary ovarian failure: Secondary | ICD-10-CM

## 2016-09-13 DIAGNOSIS — Z4659 Encounter for fitting and adjustment of other gastrointestinal appliance and device: Secondary | ICD-10-CM | POA: Insufficient documentation

## 2016-09-13 NOTE — Assessment & Plan Note (Signed)
Ref for dexa/post menopausal

## 2016-09-13 NOTE — Patient Instructions (Addendum)
Stop at check out for a mammogram referral and bone density test referral  Try to get 1200-1500 mg of calcium per day with at least 1000 iu of vitamin D - for bone health   More exercise would help your general health  Your blood pressure is improved on the 2nd check

## 2016-09-13 NOTE — Assessment & Plan Note (Signed)
Disc goals for lipids and reasons to control them Rev labs with pt Rev low sat fat diet in detail Trig are down  Continue lopid  Also LDL is fairly well controlled

## 2016-09-13 NOTE — Assessment & Plan Note (Signed)
Scheduled annual screening mammogram Nl breast exam today  Encouraged monthly self exams   

## 2016-09-13 NOTE — Assessment & Plan Note (Signed)
Hypothyroidism  Pt has no clinical changes No change in energy level/ hair or skin/ edema and no tremor Lab Results  Component Value Date   TSH 1.84 07/27/2016

## 2016-09-13 NOTE — Assessment & Plan Note (Signed)
Very well controlled with metformin/ wt loss and diet  Will add exercise Lab Results  Component Value Date   HGBA1C 5.2 07/27/2016

## 2016-09-13 NOTE — Progress Notes (Signed)
Subjective:    Patient ID: Barbara [name], female    DOB: Apr 22, 1941, 75 y.o.   MRN: [id number]  HPI Here for health maintenance exam and to review chronic medical problems    Has been feeling allright   Had AMW on 9/26 Had her flu shot  Desires a gyn exam   Mammogram - due/wants to go to [place]  Bone density-wants to schedule first one  One fracture- long ago / rib  Had one fall in the past year -on the porch She is more careful    Failed hearing exam -does not recognize problems at all and her sister thinks her hearing is excellent  Mini cog score 18/20- however pt has intellectual disability and this may factor in  Did not understand all questions    Eye exam 7/17  Complete on other imms   Wt Readings from Last 3 Encounters:  09/13/16 139 lb (63 kg)  08/30/16 139 lb (63 kg)  07/27/16 134 lb 8 oz (61 kg)   bmi is 24 Wt is stable now  Is eating regular meals/ healthy  Walks a lot for exercise    Hyperglycemia /DM2 mild  On metformin  Lab Results  Component Value Date   HGBA1C 5.2 07/27/2016   With glucose of 117 non fasting   bp is up on first check  No cp or palpitations or headaches or edema  No side effects to medicines  BP Readings from Last 3 Encounters:  09/13/16 (!) 142/68  08/30/16 118/60  07/27/16 130/76      Hypothyroidism  Pt has no clinical changes No change in energy level/ hair or skin/ edema and no tremor Lab Results  Component Value Date   TSH 1.84 07/27/2016     Colon cancer screening   Hx of hyperlipidemia Lab Results  Component Value Date   CHOL 197 07/27/2016   CHOL 215 (H) 07/06/2015   CHOL 209 (H) 01/05/2015   Lab Results  Component Value Date   HDL 36.90 (L) 07/27/2016   HDL 32.50 (L) 07/06/2015   HDL 29.10 (L) 01/05/2015   Lab Results  Component Value Date   LDLCALC 118 (H) 06/24/2014   Lab Results  Component Value Date   TRIG 201.0 (H) 07/27/2016   TRIG 282.0 (H) 07/06/2015   TRIG 358.0 (H) 01/05/2015    Lab Results  Component Value Date   CHOLHDL 5 07/27/2016   CHOLHDL 7 07/06/2015   CHOLHDL 7 01/05/2015   Lab Results  Component Value Date   LDLDIRECT 113.0 07/27/2016   LDLDIRECT 112.0 07/06/2015   LDLDIRECT 111.0 01/05/2015   Takes lopid for triglycerides  Medicine and diet has controlled it well  Need more exercise for good cholesterol    Patient Active Problem List   Diagnosis Date Noted  . Encounter for screening mammogram for breast cancer 09/13/2016  . Estrogen deficiency 09/13/2016  . History of shingles 07/27/2016  . Routine general medical examination at a health care facility 07/08/2015  . Vaginal atrophy 07/01/2013  . Encounter for Medicare annual wellness exam 07/01/2013  . Colon cancer screening 06/26/2012  . Hypothyroid 06/18/2012  . Encounter for routine gynecological examination 02/15/2012  . INTERTRIGO, CANDIDAL 06/01/2010  . Diabetes type 2, controlled ([place]) 06/05/2007  . HYPERCHOLESTEROLEMIA 06/05/2007  . ANXIETY 06/05/2007  . EXPRESSIVE LANGUAGE DISORDER 06/05/2007  . ISCHEMIA, RETINAL 06/05/2007  . Essential hypertension 06/05/2007  . ALLERGIC RHINITIS 06/05/2007  . ANGIOEDEMA 06/05/2007   Past Medical History:  Diagnosis Date  .  Allergy   . Anemia   . Anxiety   . Diabetes mellitus   . Expressive language disorder   . GERD (gastroesophageal reflux disease)   . Hyperlipidemia   . Hypertension   . Learning disability   . Mentally disabled   . Obesity   . Retinal ischemia    history of   Past Surgical History:  Procedure Laterality Date  . CATARACT EXTRACTION Left 2014  . CHOLECYSTECTOMY     Social History  Substance Use Topics  . Smoking status: Never Smoker  . Smokeless tobacco: Never Used  . Alcohol use No   Family History  Problem Relation Age of Onset  . Heart disease Father   . Hypertension Father   . Heart disease Brother   . Diabetes Brother    Allergies  Allergen Reactions  . Atorvastatin     REACTION:  increased liver fxn tests  . Ezetimibe     REACTION: stomach upset/malaise  . Sulfonamide Derivatives     REACTION: rash   Current Outpatient Prescriptions on File Prior to Visit  Medication Sig Dispense Refill  . acetaminophen (TYLENOL) 500 MG tablet Take 500 mg by mouth every 6 (six) hours as needed.      Marland Kitchen amLODipine (NORVASC) 10 MG tablet Take 1 tablet (10 mg total) by mouth daily. 90 tablet 3  . bisoprolol-hydrochlorothiazide (ZIAC) 10-6.25 MG tablet Take 1 tablet by mouth 2 (two) times daily. 180 tablet 3  . clotrimazole-betamethasone (LOTRISONE) cream Apply topically daily as needed.      . ferrous sulfate 325 (65 FE) MG tablet Take 1 tablet (325 mg total) by mouth daily with breakfast.  3  . fexofenadine (ALLEGRA) 180 MG tablet Take 180 mg by mouth as needed.    . fluticasone (FLONASE) 50 MCG/ACT nasal spray USE TWO SPRAYS IN EACH NOSTRIL DAILY 48 g 3  . gemfibrozil (LOPID) 600 MG tablet Take 1 tablet (600 mg total) by mouth 2 (two) times daily. 180 tablet 3  . Glucose Blood DISK by In Vitro route.      Marland Kitchen levothyroxine (SYNTHROID, LEVOTHROID) 100 MCG tablet Take 1 tablet (100 mcg total) by mouth daily. 90 tablet 3  . metFORMIN (GLUCOPHAGE) 500 MG tablet Take 1 tablet (500 mg total) by mouth daily with breakfast. 90 tablet 3  . omeprazole (PRILOSEC) 20 MG capsule TAKE ONE CAPSULE BY MOUTH DAILY BEFORE BREAKFAST 90 capsule 3  . quinapril (ACCUPRIL) 40 MG tablet Take 0.5 tablets (20 mg total) by mouth daily. 45 tablet 3  . sertraline (ZOLOFT) 50 MG tablet Take 1 tablet (50 mg total) by mouth daily. 90 tablet 3  . vitamin B-12 (CYANOCOBALAMIN) 1000 MCG tablet Take 1 tablet (1,000 mcg total) by mouth daily.     No current facility-administered medications on file prior to visit.     Review of Systems    Review of Systems  Constitutional: Negative for fever, appetite change, fatigue and unexpected weight change.  Eyes: Negative for pain and visual disturbance.  Respiratory: Negative  for cough and shortness of breath.   Cardiovascular: Negative for cp or palpitations    Gastrointestinal: Negative for nausea, diarrhea and constipation.  Genitourinary: Negative for urgency and frequency.  Skin: Negative for pallor or rash   Neurological: Negative for weakness, light-headedness, numbness and headaches.  Hematological: Negative for adenopathy. Does not bruise/bleed easily.  Psychiatric/Behavioral: Negative for dysphoric mood. The patient is not nervous/anxious.      Objective:   Physical Exam  Constitutional: She  appears well-developed and well-nourished. No distress.  Well appearing   HENT:  Head: Normocephalic and atraumatic.  Right Ear: External ear normal.  Left Ear: External ear normal.  Mouth/Throat: Oropharynx is clear and moist.  Eyes: Conjunctivae and EOM are normal. Pupils are equal, round, and reactive to light. Right eye exhibits no discharge. Left eye exhibits no discharge. No scleral icterus.  Baseline abn pupil R eye with no vision on that side  Neck: Normal range of motion. Neck supple. No JVD present. Carotid bruit is not present. No thyromegaly present.  Cardiovascular: Normal rate, regular rhythm, normal heart sounds and intact distal pulses.  Exam reveals no gallop.   Pulmonary/Chest: Effort normal and breath sounds normal. No respiratory distress. She has no wheezes. She exhibits no tenderness.  Abdominal: Soft. Bowel sounds are normal. She exhibits no distension, no abdominal bruit and no mass. There is no tenderness.  Genitourinary: No breast swelling, tenderness, discharge or bleeding.  Genitourinary Comments: Breast exam: No mass, nodules, thickening, tenderness, bulging, retraction, inflamation, nipple discharge or skin changes noted.  No axillary or clavicular LA.     Bimanual pelvic exam Nl app genitalia  Nl vaginal mucosa No M or tenderness  Uterus and adnexae nl to palpation    Musculoskeletal: Normal range of motion. She exhibits no  edema or tenderness.  Lymphadenopathy:    She has no cervical adenopathy.  Neurological: She is alert. She has normal reflexes. No cranial nerve deficit. She exhibits normal muscle tone. Coordination normal.  Skin: Skin is warm and dry. No rash noted. No erythema. No pallor.  Psychiatric: She has a normal mood and affect.  Baseline intellectual disability  Guardian present           Assessment & Plan:   Problem List Items Addressed This Visit      Cardiovascular and Mediastinum   Essential hypertension - Primary    bp in fair control at this time  BP Readings from Last 1 Encounters:  09/13/16 125/70   No changes needed Disc lifstyle change with low sodium diet and exercise  Labs reviewed          Endocrine   Diabetes type 2, controlled ([place])    Very well controlled with metformin/ wt loss and diet  Will add exercise Lab Results  Component Value Date   HGBA1C 5.2 07/27/2016         Hypothyroid    Hypothyroidism  Pt has no clinical changes No change in energy level/ hair or skin/ edema and no tremor Lab Results  Component Value Date   TSH 1.84 07/27/2016             Other   Encounter for screening mammogram for breast cancer    Scheduled annual screening mammogram Nl breast exam today  Encouraged monthly self exams        Relevant Orders   MM DIGITAL SCREENING BILATERAL   Estrogen deficiency    Ref for dexa/post menopausal      Relevant Orders   DG Bone Density   HYPERCHOLESTEROLEMIA    Disc goals for lipids and reasons to control them Rev labs with pt Rev low sat fat diet in detail Trig are down  Continue lopid  Also LDL is fairly well controlled       Routine general medical examination at a health care facility    Reviewed health habits including diet and exercise and skin cancer prevention Reviewed appropriate screening tests for age  Also reviewed health  mt list, fam hx and immunization status , as well as social and family history    See HPI Reviewed AMW Here with her guardian today Labs reviewed  Wt has stabilized Stop at check out for a mammogram referral and bone density test referral  Try to get 1200-1500 mg of calcium per day with at least 1000 iu of vitamin D - for bone health   More exercise would help your general health  Your blood pressure is improved on the 2nd check       Other Visit Diagnoses   None.

## 2016-09-15 NOTE — Assessment & Plan Note (Signed)
bp in fair control at this time  BP Readings from Last 1 Encounters:  09/13/16 125/70   No changes needed Disc lifstyle change with low sodium diet and exercise  Labs reviewed

## 2016-09-15 NOTE — Assessment & Plan Note (Signed)
Reviewed health habits including diet and exercise and skin cancer prevention Reviewed appropriate screening tests for age  Also reviewed health mt list, fam hx and immunization status , as well as social and family history   See HPI Reviewed AMW Here with her guardian today Labs reviewed  Wt has stabilized Stop at check out for a mammogram referral and bone density test referral  Try to get 1200-1500 mg of calcium per day with at least 1000 iu of vitamin D - for bone health   More exercise would help your general health  Your blood pressure is improved on the 2nd check

## 2016-09-23 ENCOUNTER — Ambulatory Visit
Admission: RE | Admit: 2016-09-23 | Discharge: 2016-09-23 | Disposition: A | Discharge status: Home / Self Care | Payer: PPO | Source: Ambulatory Visit | Attending: Family Medicine | Admitting: Family Medicine

## 2016-09-23 DIAGNOSIS — Z1231 Encounter for screening mammogram for malignant neoplasm of breast: Secondary | ICD-10-CM

## 2016-09-23 DIAGNOSIS — E2839 Other primary ovarian failure: Secondary | ICD-10-CM

## 2016-09-23 DIAGNOSIS — Z780 Asymptomatic menopausal state: Secondary | ICD-10-CM | POA: Diagnosis not present

## 2016-09-23 DIAGNOSIS — M810 Age-related osteoporosis without current pathological fracture: Secondary | ICD-10-CM | POA: Diagnosis not present

## 2016-09-23 LAB — HM DEXA SCAN: HM DEXA SCAN: BORDERLINE

## 2016-09-23 LAB — HM MAMMOGRAPHY

## 2016-09-25 ENCOUNTER — Encounter: Payer: Self-pay | Admitting: Family Medicine

## 2016-09-25 DIAGNOSIS — M810 Age-related osteoporosis without current pathological fracture: Secondary | ICD-10-CM | POA: Insufficient documentation

## 2016-10-06 ENCOUNTER — Encounter: Payer: Self-pay | Admitting: *Deleted

## 2016-10-12 ENCOUNTER — Ambulatory Visit (INDEPENDENT_AMBULATORY_CARE_PROVIDER_SITE_OTHER): Payer: PPO | Admitting: Family Medicine

## 2016-10-12 ENCOUNTER — Encounter: Payer: Self-pay | Admitting: Family Medicine

## 2016-10-12 VITALS — BP 126/70 | HR 40 | Temp 98.0°F | Ht 63.75 in | Wt 138.5 lb

## 2016-10-12 DIAGNOSIS — M810 Age-related osteoporosis without current pathological fracture: Secondary | ICD-10-CM

## 2016-10-12 MED ORDER — ALENDRONATE SODIUM 70 MG PO TABS: 70.0000 mg | ORAL_TABLET | ORAL | 11 refills | Status: DC

## 2016-10-12 NOTE — Progress Notes (Signed)
Pre visit review using our clinic review tool, if applicable. No additional management support is needed unless otherwise documented below in the visit note. 

## 2016-10-12 NOTE — Patient Instructions (Addendum)
Try to reduce fall risk as much as possible  Try to get 1200-1500 mg of calcium per day with at least 1000 iu of vitamin D - for bone health (best divided twice daily)  If you develop constipation - just get plain vitamin D without calcium   Start on generic fosamax (alendronate) 70 mg once weekly  Take it first thing in the am with a glass of water 30 minutes before eating / medicine or lying down   Update me if any side effects including heartburn or trouble swallowing  If you have major dental work -hold the medicine and let me know
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
## 2016-10-13 NOTE — Assessment & Plan Note (Signed)
OP of forearm and openia of fem neck  No fx  Does have hx of easy falls  Disc fall prev and handout given - (her guardian was there) Has fam hx /hypothyroid/ppi- as risk factors  Disc need for calcium/ vitamin D/ wt bearing exercise and bone density test every 2 y to monitor Disc safety/ fracture risk in detail   Will try fosamax 70 mg weekly-disc poss side eff like GI/ pain or jaw tumor  See AVS If not tolerated would try evista Will aim for 5 y course

## 2017-07-19 ENCOUNTER — Other Ambulatory Visit: Payer: Self-pay | Admitting: Family Medicine

## 2017-07-19 NOTE — Telephone Encounter (Signed)
Please schedule annual exam after 09/13/17 and refill until then  Thanks

## 2017-07-19 NOTE — Telephone Encounter (Signed)
No recent or future appts., last TSH was almost a year ago, please advise

## 2017-07-20 NOTE — Telephone Encounter (Signed)
Sister schedule appt and med refilled

## 2017-08-02 ENCOUNTER — Other Ambulatory Visit: Payer: Self-pay | Admitting: Family Medicine

## 2017-08-16 ENCOUNTER — Other Ambulatory Visit: Payer: Self-pay | Admitting: Family Medicine

## 2017-08-23 ENCOUNTER — Other Ambulatory Visit: Payer: Self-pay | Admitting: Family Medicine

## 2017-08-30 ENCOUNTER — Telehealth: Payer: Self-pay | Admitting: Family Medicine

## 2017-08-30 DIAGNOSIS — E7800 Pure hypercholesterolemia, unspecified: Secondary | ICD-10-CM

## 2017-08-30 DIAGNOSIS — I10 Essential (primary) hypertension: Secondary | ICD-10-CM

## 2017-08-30 DIAGNOSIS — E119 Type 2 diabetes mellitus without complications: Secondary | ICD-10-CM

## 2017-08-30 DIAGNOSIS — E039 Hypothyroidism, unspecified: Secondary | ICD-10-CM

## 2017-08-30 NOTE — Telephone Encounter (Signed)
-----   Message from Eustace Pen, LPN sent at 2/48/1859 10:00 PM EDT ----- Regarding: Labs 9/28 Lab orders needed.   [employer]

## 2017-09-01 ENCOUNTER — Ambulatory Visit (INDEPENDENT_AMBULATORY_CARE_PROVIDER_SITE_OTHER): Payer: PPO

## 2017-09-01 VITALS — BP 124/70 | HR 55 | Temp 97.7°F | Ht 64.0 in | Wt 147.5 lb

## 2017-09-01 DIAGNOSIS — E039 Hypothyroidism, unspecified: Secondary | ICD-10-CM

## 2017-09-01 DIAGNOSIS — E119 Type 2 diabetes mellitus without complications: Secondary | ICD-10-CM

## 2017-09-01 DIAGNOSIS — Z0000 Encounter for general adult medical examination without abnormal findings: Secondary | ICD-10-CM

## 2017-09-01 DIAGNOSIS — I10 Essential (primary) hypertension: Secondary | ICD-10-CM

## 2017-09-01 DIAGNOSIS — E7800 Pure hypercholesterolemia, unspecified: Secondary | ICD-10-CM

## 2017-09-01 DIAGNOSIS — Z23 Encounter for immunization: Secondary | ICD-10-CM | POA: Diagnosis not present

## 2017-09-01 LAB — COMPREHENSIVE METABOLIC PANEL
ALK PHOS: 42 U/L (ref 39–117)
ALT: 10 U/L (ref 0–35)
AST: 16 U/L (ref 0–37)
Albumin: 4.7 g/dL (ref 3.5–5.2)
BUN: 24 mg/dL — ABNORMAL HIGH (ref 6–23)
CALCIUM: 10 mg/dL (ref 8.4–10.5)
CO2: 25 meq/L (ref 19–32)
CREATININE: 0.87 mg/dL (ref 0.40–1.20)
Chloride: 100 mEq/L (ref 96–112)
GFR: 67.36 mL/min (ref >60.00)
GLUCOSE: 122 mg/dL — AB (ref 70–99)
Potassium: 4.1 mEq/L (ref 3.5–5.1)
Sodium: 136 mEq/L (ref 135–145)
TOTAL PROTEIN: 7.5 g/dL (ref 6.0–8.3)
Total Bilirubin: 0.4 mg/dL (ref 0.2–1.2)

## 2017-09-01 LAB — LIPID PANEL
Cholesterol: 207 mg/dL — ABNORMAL HIGH (ref 0–200)
HDL: 33.5 mg/dL — ABNORMAL LOW (ref >39.00)
LDL Cholesterol: 134 mg/dL — ABNORMAL HIGH (ref 0–99)
NonHDL: 173.55
Total CHOL/HDL Ratio: 6
Triglycerides: 197 mg/dL — ABNORMAL HIGH (ref 0.0–149.0)
VLDL: 39.4 mg/dL (ref 0.0–40.0)

## 2017-09-01 LAB — CBC WITH DIFFERENTIAL/PLATELET
Basophils Absolute: 0.1 10*3/uL (ref 0.0–0.1)
Basophils Relative: 0.9 % (ref 0.0–3.0)
EOS ABS: 0.1 10*3/uL (ref 0.0–0.7)
Eosinophils Relative: 1.8 % (ref 0.0–5.0)
HEMATOCRIT: 35.1 % — AB (ref 36.0–46.0)
Hemoglobin: 11.9 g/dL — ABNORMAL LOW (ref 12.0–15.0)
LYMPHS PCT: 26.8 % (ref 12.0–46.0)
Lymphs Abs: 1.7 10*3/uL (ref 0.7–4.0)
MCHC: 33.9 g/dL (ref 30.0–36.0)
MCV: 92 fl (ref 78.0–100.0)
MONOS PCT: 10.2 % (ref 3.0–12.0)
Monocytes Absolute: 0.7 10*3/uL (ref 0.1–1.0)
Neutro Abs: 3.9 10*3/uL (ref 1.4–7.7)
Neutrophils Relative %: 60.3 % (ref 43.0–77.0)
PLATELETS: 263 10*3/uL (ref 150.0–400.0)
RBC: 3.81 Mil/uL — AB (ref 3.87–5.11)
RDW: 13.4 % (ref 11.5–15.5)
WBC: 6.4 10*3/uL (ref 4.0–10.5)

## 2017-09-01 LAB — TSH: TSH: 2.51 u[IU]/mL (ref 0.35–4.50)

## 2017-09-01 LAB — HEMOGLOBIN A1C: HEMOGLOBIN A1C: 5.4 % (ref 4.6–6.5)

## 2017-09-01 NOTE — Patient Instructions (Signed)
Barbara [name] , Thank you for taking time to come for your Medicare Wellness Visit. I appreciate your ongoing commitment to your health goals. Please review the following plan we discussed and let me know if I can assist you in the future.   These are the goals we discussed: Goals    . Increase water intake          Starting 09/01/2017, I will attempt to drink at least 6-8 glasses of water daily.        This is a list of the screening recommended for you and due dates:  Health Maintenance  Topic Date Due  . Eye exam for diabetics  06/15/2018*  . Complete foot exam   07/26/2018*  . Pap Smear  09/02/2019*  . Mammogram  09/23/2017  . Tetanus Vaccine  02/06/2018  . Hemoglobin A1C  03/01/2018  . Flu Shot  Completed  . DEXA scan (bone density measurement)  Completed  . Pneumonia vaccines  Completed  *Topic was postponed. The date shown is not the original due date.   Preventive Care for Adults  A healthy lifestyle and preventive care can promote health and wellness. Preventive health guidelines for adults include the following key practices.  . A routine yearly physical is a good way to check with your health care provider about your health and preventive screening. It is a chance to share any concerns and updates on your health and to receive a thorough exam.  . Visit your dentist for a routine exam and preventive care every 6 months. Brush your teeth twice a day and floss once a day. Good oral hygiene prevents tooth decay and gum disease.  . The frequency of eye exams is based on your age, health, family medical history, use  of contact lenses, and other factors. Follow your health care provider's ecommendations for frequency of eye exams.  . Eat a healthy diet. Foods like vegetables, fruits, whole grains, low-fat dairy products, and lean protein foods contain the nutrients you need without too many calories. Decrease your intake of foods high in solid fats, added sugars, and salt. Eat  the right amount of calories for you. Get information about a proper diet from your health care provider, if necessary.  . Regular physical exercise is one of the most important things you can do for your health. Most adults should get at least 150 minutes of moderate-intensity exercise (any activity that increases your heart rate and causes you to sweat) each week. In addition, most adults need muscle-strengthening exercises on 2 or more days a week.  Silver Sneakers may be a benefit available to you. To determine eligibility, you may visit the website: www.silversneakers.com or contact program at 201-465-5166 Mon-Fri between 8AM-8PM.   . Maintain a healthy weight. The body mass index (BMI) is a screening tool to identify possible weight problems. It provides an estimate of body fat based on height and weight. Your health care provider can find your BMI and can help you achieve or maintain a healthy weight.   For adults 20 years and older: ? A BMI below 18.5 is considered underweight. ? A BMI of 18.5 to 24.9 is normal. ? A BMI of 25 to 29.9 is considered overweight. ? A BMI of 30 and above is considered obese.   . Maintain normal blood lipids and cholesterol levels by exercising and minimizing your intake of saturated fat. Eat a balanced diet with plenty of fruit and vegetables. Blood tests for lipids and cholesterol  should begin at age 16 and be repeated every 5 years. If your lipid or cholesterol levels are high, you are over 50, or you are at high risk for heart disease, you may need your cholesterol levels checked more frequently. Ongoing high lipid and cholesterol levels should be treated with medicines if diet and exercise are not working.  . If you smoke, find out from your health care provider how to quit. If you do not use tobacco, please do not start.  . If you choose to drink alcohol, please do not consume more than 2 drinks per day. One drink is considered to be 12 ounces (355 mL)  of beer, 5 ounces (148 mL) of wine, or 1.5 ounces (44 mL) of liquor.  . If you are 21-64 years old, ask your health care provider if you should take aspirin to prevent strokes.  . Use sunscreen. Apply sunscreen liberally and repeatedly throughout the day. You should seek shade when your shadow is shorter than you. Protect yourself by wearing long sleeves, pants, a wide-brimmed hat, and sunglasses year round, whenever you are outdoors.  . Once a month, do a whole body skin exam, using a mirror to look at the skin on your back. Tell your health care provider of new moles, moles that have irregular borders, moles that are larger than a pencil eraser, or moles that have changed in shape or color.

## 2017-09-01 NOTE — Progress Notes (Signed)
PCP notes:   Health maintenance:  Flu vaccine - administered Eye exam - addressed; per pt, exam scheduled in Oct 2018 A1C - completed  Abnormal screenings:   Fall risk- hx of multiple falls without injury Hearing - failed  Hearing Screening   125Hz  250Hz  500Hz  1000Hz  2000Hz  3000Hz  4000Hz  6000Hz  8000Hz   Right ear:   40 40 40  0    Left ear:   40 40 40  0      Patient concerns:   None  Nurse concerns:  None  Next PCP appt:   09/19/17 @ 1130  I reviewed health advisor's note, was available for consultation, and agree with documentation and plan. Loura Pardon MD

## 2017-09-01 NOTE — Progress Notes (Signed)
Pre visit review using our clinic review tool, if applicable. No additional management support is needed unless otherwise documented below in the visit note. 

## 2017-09-01 NOTE — Progress Notes (Signed)
Subjective:   Barbara [name] is a 76 y.o. female who presents for Medicare Annual (Subsequent) preventive examination.  Review of Systems:  N/A Cardiac Risk Factors include: advanced age (>42men, >29 women);diabetes mellitus;dyslipidemia;hypertension     Objective:     Vitals: BP 124/70 (BP Location: Right Arm, Patient Position: Sitting, Cuff Size: Normal)   Pulse (!) 55   Temp 97.7 F (36.5 C) (Oral)   Ht 5\' 4"  (1.626 m) Comment: no shoes  Wt 147 lb 8 oz (66.9 kg)   SpO2 92%   BMI 25.32 kg/m   Body mass index is 25.32 kg/m.   Tobacco History  Smoking Status  . Never Smoker  Smokeless Tobacco  . Never Used     Counseling given: No   Past Medical History:  Diagnosis Date  . Allergy   . Anemia   . Anxiety   . Diabetes mellitus   . Expressive language disorder   . GERD (gastroesophageal reflux disease)   . Hyperlipidemia   . Hypertension   . Learning disability   . Mentally disabled   . Obesity   . Retinal ischemia    history of   Past Surgical History:  Procedure Laterality Date  . CATARACT EXTRACTION Left 2014  . CHOLECYSTECTOMY     Family History  Problem Relation Age of Onset  . Heart disease Father   . Hypertension Father   . Heart disease Brother   . Diabetes Brother    History  Sexual Activity  . Sexual activity: No    Outpatient Encounter Prescriptions as of 09/01/2017  Medication Sig  . acetaminophen (TYLENOL) 500 MG tablet Take 500 mg by mouth every 6 (six) hours as needed.    Marland Kitchen alendronate (FOSAMAX) 70 MG tablet Take 1 tablet (70 mg total) by mouth every 7 (seven) days. Take with a full glass of water on an empty stomach.  Marland Kitchen amLODipine (NORVASC) 10 MG tablet TAKE 1 TABLET BY MOUTH DAILY  . bisoprolol-hydrochlorothiazide (ZIAC) 10-6.25 MG tablet TAKE 1 TABLET BY MOUTH TWICE A DAY  . clotrimazole-betamethasone (LOTRISONE) cream Apply topically daily as needed.    . ferrous sulfate 325 (65 FE) MG tablet Take 1 tablet (325 mg total) by  mouth daily with breakfast.  . fexofenadine (ALLEGRA) 180 MG tablet Take 180 mg by mouth as needed.  . fluticasone (FLONASE) 50 MCG/ACT nasal spray USE TWO SPRAYS IN EACH NOSTRIL DAILY  . gemfibrozil (LOPID) 600 MG tablet Take 1 tablet (600 mg total) by mouth 2 (two) times daily.  . Glucose Blood DISK by In Vitro route.    Marland Kitchen levothyroxine (SYNTHROID, LEVOTHROID) 100 MCG tablet TAKE 1 TABLET BY MOUTH DAILY  . metFORMIN (GLUCOPHAGE) 500 MG tablet Take 1 tablet (500 mg total) by mouth daily with breakfast.  . omeprazole (PRILOSEC) 20 MG capsule TAKE ONE CAPSULE BY MOUTH DAILY BEFORE BREAKFAST  . quinapril (ACCUPRIL) 40 MG tablet TAKE ONE-HALF (0.5) BY MOUTH DAILY  . sertraline (ZOLOFT) 50 MG tablet TAKE 1 TABLET BY MOUTH DAILY  . vitamin B-12 (CYANOCOBALAMIN) 1000 MCG tablet Take 1 tablet (1,000 mcg total) by mouth daily.   No facility-administered encounter medications on file as of 09/01/2017.     Activities of Daily Living In your present state of health, do you have any difficulty performing the following activities: 09/01/2017  Hearing? N  Vision? Y  Difficulty concentrating or making decisions? N  Walking or climbing stairs? N  Dressing or bathing? N  Doing errands, shopping? Darreld Mclean  Preparing Food and eating ? N  Using the Toilet? N  In the past six months, have you accidently leaked urine? N  Do you have problems with loss of bowel control? N  Managing your Medications? N  Managing your Finances? N  Housekeeping or managing your Housekeeping? N  Some recent data might be hidden    Patient Care Team: Tower, Wynelle Fanny, MD as PCP - Lacie Scotts, MD as Consulting Physician (Dermatology)    Assessment:     Hearing Screening   125Hz  250Hz  500Hz  1000Hz  2000Hz  3000Hz  4000Hz  6000Hz  8000Hz   Right ear:   40 40 40  0    Left ear:   40 40 40  0    Vision Screening Comments: Last vision exam in July 2017 with Dr. Wallace Going; appt scheduled 09/12/2017   Exercise Activities and  Dietary recommendations Current Exercise Habits: The patient does not participate in regular exercise at present, Exercise limited by: None identified  Goals    . Increase water intake          Starting 09/01/2017, I will attempt to drink at least 6-8 glasses of water daily.       Fall Risk Fall Risk  09/01/2017 08/30/2016 07/08/2015 07/01/2014 07/01/2014  Falls in the past year? Yes Yes Yes - Yes  Comment pt stated she tripped over her own feet - - - -  Number falls in past yr: 2 or more 1 1 - 1  Injury with Fall? No No No No No  Risk for fall due to : - - - Impaired vision -  Follow up - Falls evaluation completed - - -   Depression Screen PHQ 2/9 Scores 09/01/2017 08/30/2016 07/08/2015 07/01/2014  PHQ - 2 Score 0 0 0 0  PHQ- 9 Score 0 - - -     Cognitive Function MMSE - Mini Mental State Exam 09/01/2017 08/30/2016  Orientation to time 5 3  Orientation to time comments - pt was unable to provide correct year despite multiple cues  Orientation to Place 5 5  Registration 3 3  Attention/ Calculation 0 0  Recall 3 3  Language- name 2 objects 0 0  Language- repeat 1 1  Language- follow 3 step command 3 3  Language- read & follow direction 0 0  Write a sentence 0 0  Copy design 0 0  Total score 20 18       PLEASE NOTE: A Mini-Cog screen was completed. Maximum score is 20. A value of 0 denotes this part of Folstein MMSE was not completed or the patient failed this part of the Mini-Cog screening.   Mini-Cog Screening Orientation to Time - Max 5 pts Orientation to Place - Max 5 pts Registration - Max 3 pts Recall - Max 3 pts Language Repeat - Max 1 pts Language Follow 3 Step Command - Max 3 pts   Immunization History  Administered Date(s) Administered  . Influenza Split 12/04/2011, 09/28/2012  . Influenza Whole 09/08/2009, 09/08/2010  . Influenza,inj,Quad PF,6+ Mos 08/30/2016, 09/01/2017  . Influenza-Unspecified 10/11/2013, 09/24/2014, 10/21/2015  . Pneumococcal Conjugate-13  07/01/2014  . Pneumococcal Polysaccharide-23 12/08/2009  . Td 02/07/2008  . Zoster 04/18/2012   Screening Tests Health Maintenance  Topic Date Due  . OPHTHALMOLOGY EXAM  06/15/2018 (Originally 06/16/2017)  . FOOT EXAM  07/26/2018 (Originally 07/27/2017)  . PAP SMEAR  09/02/2019 (Originally 07/02/2015)  . MAMMOGRAM  09/23/2017  . TETANUS/TDAP  02/06/2018  . HEMOGLOBIN A1C  03/01/2018  . INFLUENZA VACCINE  Completed  . DEXA SCAN  Completed  . PNA vac Low Risk Adult  Completed      Plan:     I have personally reviewed and addressed the Medicare Annual Wellness questionnaire and have noted the following in the patient's chart:  A. Medical and social history B. Use of alcohol, tobacco or illicit drugs  C. Current medications and supplements D. Functional ability and status E.  Nutritional status F.  Physical activity G. Advance directives H. List of other physicians I.  Hospitalizations, surgeries, and ER visits in previous 12 months J.  [place] to include hearing, vision, cognitive, depression L. Referrals and appointments - none  In addition, I have reviewed and discussed with patient certain preventive protocols, quality metrics, and best practice recommendations. A written personalized care plan for preventive services as well as general preventive health recommendations were provided to patient.  See attached scanned questionnaire for additional information.   Signed,   Lindell Noe, MHA, BS, LPN Health Coach

## 2017-09-04 ENCOUNTER — Other Ambulatory Visit: Payer: Self-pay | Admitting: Family Medicine

## 2017-09-12 DIAGNOSIS — E119 Type 2 diabetes mellitus without complications: Secondary | ICD-10-CM | POA: Diagnosis not present

## 2017-09-12 LAB — HM DIABETES EYE EXAM

## 2017-09-15 ENCOUNTER — Ambulatory Visit: Payer: PPO

## 2017-09-19 ENCOUNTER — Encounter: Payer: Self-pay | Admitting: Family Medicine

## 2017-09-19 ENCOUNTER — Ambulatory Visit (INDEPENDENT_AMBULATORY_CARE_PROVIDER_SITE_OTHER): Payer: PPO | Admitting: Family Medicine

## 2017-09-19 ENCOUNTER — Encounter: Payer: PPO | Admitting: Family Medicine

## 2017-09-19 VITALS — BP 122/72 | HR 56 | Temp 98.2°F | Ht 64.0 in

## 2017-09-19 DIAGNOSIS — F411 Generalized anxiety disorder: Secondary | ICD-10-CM | POA: Diagnosis not present

## 2017-09-19 DIAGNOSIS — E039 Hypothyroidism, unspecified: Secondary | ICD-10-CM | POA: Diagnosis not present

## 2017-09-19 DIAGNOSIS — E7800 Pure hypercholesterolemia, unspecified: Secondary | ICD-10-CM

## 2017-09-19 DIAGNOSIS — E119 Type 2 diabetes mellitus without complications: Secondary | ICD-10-CM

## 2017-09-19 DIAGNOSIS — M810 Age-related osteoporosis without current pathological fracture: Secondary | ICD-10-CM | POA: Diagnosis not present

## 2017-09-19 DIAGNOSIS — I10 Essential (primary) hypertension: Secondary | ICD-10-CM

## 2017-09-19 DIAGNOSIS — Z0000 Encounter for general adult medical examination without abnormal findings: Secondary | ICD-10-CM

## 2017-09-19 MED ORDER — FLUTICASONE PROPIONATE 50 MCG/ACT NA SUSP: NASAL | 3 refills | Status: DC

## 2017-09-19 MED ORDER — ALENDRONATE SODIUM 70 MG PO TABS: ORAL_TABLET | ORAL | 3 refills | Status: DC

## 2017-09-19 MED ORDER — LEVOTHYROXINE SODIUM 100 MCG PO TABS: 100.0000 ug | ORAL_TABLET | Freq: Every day | ORAL | 3 refills | Status: DC

## 2017-09-19 MED ORDER — OMEPRAZOLE 20 MG PO CPDR: DELAYED_RELEASE_CAPSULE | ORAL | 3 refills | Status: DC

## 2017-09-19 MED ORDER — METFORMIN HCL 500 MG PO TABS: 500.0000 mg | ORAL_TABLET | Freq: Every day | ORAL | 3 refills | Status: DC

## 2017-09-19 MED ORDER — QUINAPRIL HCL 40 MG PO TABS: ORAL_TABLET | ORAL | 3 refills | Status: DC

## 2017-09-19 MED ORDER — AMLODIPINE BESYLATE 10 MG PO TABS: 10.0000 mg | ORAL_TABLET | Freq: Every day | ORAL | 3 refills | Status: DC

## 2017-09-19 MED ORDER — GEMFIBROZIL 600 MG PO TABS: 600.0000 mg | ORAL_TABLET | Freq: Two times a day (BID) | ORAL | 3 refills | Status: DC

## 2017-09-19 MED ORDER — BISOPROLOL-HYDROCHLOROTHIAZIDE 10-6.25 MG PO TABS: 1.0000 | ORAL_TABLET | Freq: Two times a day (BID) | ORAL | 3 refills | Status: DC

## 2017-09-19 MED ORDER — SERTRALINE HCL 50 MG PO TABS: 50.0000 mg | ORAL_TABLET | Freq: Every day | ORAL | 3 refills | Status: DC

## 2017-09-19 NOTE — Assessment & Plan Note (Signed)
dexa 10/17 Due in another year On fosamax and tolerates well  On ca and D Disc fall precautions- pt does not use walker or cane

## 2017-09-19 NOTE — Assessment & Plan Note (Signed)
bp in fair control at this time  BP Readings from Last 1 Encounters:  09/19/17 122/72   No changes needed Disc lifstyle change with low sodium diet and exercise  No change in therapy

## 2017-09-19 NOTE — Assessment & Plan Note (Signed)
Chronic and well controlled with zoloft  Refilled

## 2017-09-19 NOTE — Assessment & Plan Note (Signed)
Hypothyroidism  Pt has no clinical changes No change in energy level/ hair or skin/ edema and no tremor Lab Results  Component Value Date   TSH 2.51 09/01/2017

## 2017-09-19 NOTE — Assessment & Plan Note (Signed)
Reviewed health habits including diet and exercise and skin cancer prevention Reviewed appropriate screening tests for age  Also reviewed health mt list, fam hx and immunization status , as well as social and family history   See HPI amw rev -no hearing c/o  Her sister will set up her mammogram  Eye exam report sent for  No gyn symptoms  Labs reviewed  Enc regular exercise for overall health and bone health  dexa due in 1 y

## 2017-09-19 NOTE — Patient Instructions (Addendum)
Don't forget to have your sister set up your mammogram appointment   Keep walking in a safe area for exercise  Use a walker or a cane if you feel you are at risk for falls   Thyroid is stable  Diabetes is stable   Cholesterol went up (LDL went up from 113 to 134)  Avoid red meat/ fried foods/ egg yolks/ fatty breakfast meats/ butter, cheese and high fat dairy/ and shellfish   Try to cut back on sausage and bacon and fried foods   Also drink water- 8 glasses per day for kidney health   Make sure to use sunscreen to protect   Stay on the fosamax for your bones

## 2017-09-19 NOTE — Progress Notes (Signed)
Subjective:    Patient ID: Barbara [name], female    DOB: May 20, 1941, 76 y.o.   MRN: [id number]  HPI Here for health maintenance exam and to review chronic medical problems    Wt Readings from Last 3 Encounters:  09/01/17 147 lb 8 oz (66.9 kg)  10/12/16 138 lb 8 oz (62.8 kg)  09/13/16 139 lb (63 kg)  walks for exercise  Eats very healthy - fruit and vegetables   bmi is 25.3 Feeling good overall      amw done 9/28 Hearing screen missed 4000 Hz both ears  She does not think she has any problems with hearing  Flu vaccine given   Eye exam scheduled for this month - had it last tues  No retinopathy   Has hx of falls -last fall was 2-3 weeks /tripped  Declines use of cane or walker  She says she just goes too fast  No fractures   Pap 7/14 No vaginal bleeding or d/c or itching or lesions    Mammogram 10/17 negative -sister will set up for her  Self breast exam -no lumps or changes   dexa 10/17 Osteoporosis in forearm  Given fosamax - no side effects or problems with that  No fractures Does occ fall  Takes her ca and D    Colonoscopy 3/14- nl with no recall recommended due to age   zostavax 5/13  bp is stable today  No cp or palpitations or headaches or edema  No side effects to medicines  BP Readings from Last 3 Encounters:  09/19/17 122/72  09/01/17 124/70  10/12/16 126/70     Lab Results  Component Value Date   CREATININE 0.87 09/01/2017   BUN 24 (H) 09/01/2017   NA 136 09/01/2017   K 4.1 09/01/2017   CL 100 09/01/2017   CO2 25 09/01/2017   Lab Results  Component Value Date   ALT 10 09/01/2017   AST 16 09/01/2017   ALKPHOS 42 09/01/2017   BILITOT 0.4 09/01/2017    Lab Results  Component Value Date   WBC 6.4 09/01/2017   HGB 11.9 (L) 09/01/2017   HCT 35.1 (L) 09/01/2017   MCV 92.0 09/01/2017   PLT 263.0 09/01/2017  no different from her baseline    Controlled DM2 Lab Results  Component Value Date   HGBA1C 5.4 09/01/2017  ace for  renal protection Metformin for glucose control -still very good control No hypoglycemia  Once in a while will have glucose of 200- very rare  Diet is good   Hypothyroidism  Pt has no clinical changes No change in energy level/ hair or skin/ edema and no tremor Lab Results  Component Value Date   TSH 2.51 09/01/2017     Hyperlipidemia Lab Results  Component Value Date   CHOL 207 (H) 09/01/2017   CHOL 197 07/27/2016   CHOL 215 (H) 07/06/2015   Lab Results  Component Value Date   HDL 33.50 (L) 09/01/2017   HDL 36.90 (L) 07/27/2016   HDL 32.50 (L) 07/06/2015   Lab Results  Component Value Date   LDLCALC 134 (H) 09/01/2017   LDLCALC 118 (H) 06/24/2014   Lab Results  Component Value Date   TRIG 197.0 (H) 09/01/2017   TRIG 201.0 (H) 07/27/2016   TRIG 282.0 (H) 07/06/2015   Lab Results  Component Value Date   CHOLHDL 6 09/01/2017   CHOLHDL 5 07/27/2016   CHOLHDL 7 07/06/2015   Lab Results  Component Value Date  LDLDIRECT 113.0 07/27/2016   LDLDIRECT 112.0 07/06/2015   LDLDIRECT 111.0 01/05/2015   Takes gemfibrozil for triglycerides (stable)  LDL is up a bit  Some fried foods (once per week)  Seldom red meat   Patient Active Problem List   Diagnosis Date Noted  . Osteoporosis 09/25/2016  . Encounter for screening mammogram for breast cancer 09/13/2016  . Estrogen deficiency 09/13/2016  . History of shingles 07/27/2016  . Routine general medical examination at a health care facility 07/08/2015  . Vaginal atrophy 07/01/2013  . Encounter for Medicare annual wellness exam 07/01/2013  . Colon cancer screening 06/26/2012  . Hypothyroid 06/18/2012  . Encounter for routine gynecological examination 02/15/2012  . Diabetes type 2, controlled ([place]) 06/05/2007  . HYPERCHOLESTEROLEMIA 06/05/2007  . Generalized anxiety disorder 06/05/2007  . EXPRESSIVE LANGUAGE DISORDER 06/05/2007  . ISCHEMIA, RETINAL 06/05/2007  . Essential hypertension 06/05/2007  . ALLERGIC  RHINITIS 06/05/2007   Past Medical History:  Diagnosis Date  . Allergy   . Anemia   . Anxiety   . Diabetes mellitus   . Expressive language disorder   . GERD (gastroesophageal reflux disease)   . Hyperlipidemia   . Hypertension   . Learning disability   . Mentally disabled   . Obesity   . Retinal ischemia    history of   Past Surgical History:  Procedure Laterality Date  . CATARACT EXTRACTION Left 2014  . CHOLECYSTECTOMY     Social History  Substance Use Topics  . Smoking status: Never Smoker  . Smokeless tobacco: Never Used  . Alcohol use No   Family History  Problem Relation Age of Onset  . Heart disease Father   . Hypertension Father   . Heart disease Brother   . Diabetes Brother    Allergies  Allergen Reactions  . Atorvastatin     REACTION: increased liver fxn tests  . Ezetimibe     REACTION: stomach upset/malaise  . Sulfonamide Derivatives     REACTION: rash   Current Outpatient Prescriptions on File Prior to Visit  Medication Sig Dispense Refill  . acetaminophen (TYLENOL) 500 MG tablet Take 500 mg by mouth every 6 (six) hours as needed.      . clotrimazole-betamethasone (LOTRISONE) cream Apply topically daily as needed.      . ferrous sulfate 325 (65 FE) MG tablet Take 1 tablet (325 mg total) by mouth daily with breakfast.  3  . fexofenadine (ALLEGRA) 180 MG tablet Take 180 mg by mouth as needed.    . Glucose Blood DISK by In Vitro route.      . vitamin B-12 (CYANOCOBALAMIN) 1000 MCG tablet Take 1 tablet (1,000 mcg total) by mouth daily.     No current facility-administered medications on file prior to visit.     Review of Systems  Constitutional: Negative for activity change, appetite change, fatigue, fever and unexpected weight change.  HENT: Negative for congestion, ear pain, rhinorrhea, sinus pressure and sore throat.   Eyes: Negative for pain, redness and visual disturbance.  Respiratory: Negative for cough, shortness of breath and wheezing.     Cardiovascular: Negative for chest pain and palpitations.  Gastrointestinal: Negative for abdominal pain, blood in stool, constipation and diarrhea.  Endocrine: Negative for polydipsia and polyuria.  Genitourinary: Negative for dysuria, frequency and urgency.  Musculoskeletal: Negative for arthralgias, back pain and myalgias.  Skin: Negative for pallor and rash.  Allergic/Immunologic: Negative for environmental allergies.  Neurological: Negative for dizziness, syncope and headaches.  Hematological: Negative for adenopathy.  Does not bruise/bleed easily.  Psychiatric/Behavioral: Negative for decreased concentration and dysphoric mood. The patient is not nervous/anxious.        Objective:   Physical Exam  Constitutional: She appears well-developed and well-nourished. No distress.  Well appearing   HENT:  Head: Normocephalic and atraumatic.  Right Ear: External ear normal.  Left Ear: External ear normal.  Mouth/Throat: Oropharynx is clear and moist.  Eyes: Pupils are equal, round, and reactive to light. Conjunctivae and EOM are normal. No scleral icterus.  Neck: Normal range of motion. Neck supple. No JVD present. Carotid bruit is not present. No thyromegaly present.  Cardiovascular: Normal rate, regular rhythm, normal heart sounds and intact distal pulses.  Exam reveals no gallop.   Pulmonary/Chest: Effort normal and breath sounds normal. No respiratory distress. She has no wheezes. She exhibits no tenderness.  Abdominal: Soft. Bowel sounds are normal. She exhibits no distension, no abdominal bruit and no mass. There is no tenderness.  Genitourinary: No breast swelling, tenderness, discharge or bleeding.  Genitourinary Comments: Breast exam: No mass, nodules, thickening, tenderness, bulging, retraction, inflamation, nipple discharge or skin changes noted.  No axillary or clavicular LA.      Musculoskeletal: Normal range of motion. She exhibits no edema or tenderness.  Lymphadenopathy:     She has no cervical adenopathy.  Neurological: She is alert. She has normal reflexes. No cranial nerve deficit. She exhibits normal muscle tone. Coordination normal.  Skin: Skin is warm and dry. No rash noted. No erythema. No pallor.  Solar lentigines diffusely Some solar aging  SKs diffusely     Psychiatric: She has a normal mood and affect.  Baseline cognitive slowness and speech impediment  No change           Assessment & Plan:   Problem List Items Addressed This Visit      Cardiovascular and Mediastinum   Essential hypertension - Primary    bp in fair control at this time  BP Readings from Last 1 Encounters:  09/19/17 122/72   No changes needed Disc lifstyle change with low sodium diet and exercise  No change in therapy        Relevant Medications   quinapril (ACCUPRIL) 40 MG tablet   gemfibrozil (LOPID) 600 MG tablet   bisoprolol-hydrochlorothiazide (ZIAC) 10-6.25 MG tablet   amLODipine (NORVASC) 10 MG tablet     Endocrine   Diabetes type 2, controlled ([place])    Lab Results  Component Value Date   HGBA1C 5.4 09/01/2017   Remains in good control with low dose metformin daily  Wt is up  Rev diet  Disc eye and foot care Sent for last eye exam  F/u 6 mo       Relevant Medications   quinapril (ACCUPRIL) 40 MG tablet   metFORMIN (GLUCOPHAGE) 500 MG tablet   bisoprolol-hydrochlorothiazide (ZIAC) 10-6.25 MG tablet   Hypothyroid    Hypothyroidism  Pt has no clinical changes No change in energy level/ hair or skin/ edema and no tremor Lab Results  Component Value Date   TSH 2.51 09/01/2017           Relevant Medications   levothyroxine (SYNTHROID, LEVOTHROID) 100 MCG tablet     Musculoskeletal and Integument   Osteoporosis    dexa 10/17 Due in another year On fosamax and tolerates well  On ca and D Disc fall precautions- pt does not use walker or cane        Relevant Medications   alendronate (FOSAMAX) 70  MG tablet     Other    Generalized anxiety disorder    Chronic and well controlled with zoloft  Refilled        Relevant Medications   sertraline (ZOLOFT) 50 MG tablet   HYPERCHOLESTEROLEMIA    Disc goals for lipids and reasons to control them Rev labs with pt Rev low sat fat diet in detail LDL is up  Given handout on low sat/trans fat diet  Will try to avoid fatty and fried foods Re check 6 mo  Gemfibrozil continues to control triglycerides       Relevant Medications   quinapril (ACCUPRIL) 40 MG tablet   gemfibrozil (LOPID) 600 MG tablet   bisoprolol-hydrochlorothiazide (ZIAC) 10-6.25 MG tablet   amLODipine (NORVASC) 10 MG tablet   Routine general medical examination at a health care facility    Reviewed health habits including diet and exercise and skin cancer prevention Reviewed appropriate screening tests for age  Also reviewed health mt list, fam hx and immunization status , as well as social and family history   See HPI amw rev -no hearing c/o  Her sister will set up her mammogram  Eye exam report sent for  No gyn symptoms  Labs reviewed  Enc regular exercise for overall health and bone health  dexa due in 1 y

## 2017-09-19 NOTE — Assessment & Plan Note (Addendum)
Disc goals for lipids and reasons to control them Rev labs with pt Rev low sat fat diet in detail LDL is up  Given handout on low sat/trans fat diet  Will try to avoid fatty and fried foods Re check 6 mo  Gemfibrozil continues to control triglycerides

## 2017-09-19 NOTE — Assessment & Plan Note (Signed)
Lab Results  Component Value Date   HGBA1C 5.4 09/01/2017   Remains in good control with low dose metformin daily  Wt is up  Rev diet  Disc eye and foot care Sent for last eye exam  F/u 6 mo

## 2017-09-21 ENCOUNTER — Encounter: Payer: Self-pay | Admitting: Family Medicine

## 2018-01-16 ENCOUNTER — Other Ambulatory Visit: Payer: Self-pay | Admitting: *Deleted

## 2018-01-16 MED ORDER — ALENDRONATE SODIUM 70 MG PO TABS: ORAL_TABLET | ORAL | 1 refills | Status: DC

## 2018-02-06 ENCOUNTER — Other Ambulatory Visit: Payer: Self-pay | Admitting: Family Medicine

## 2018-02-06 DIAGNOSIS — Z1231 Encounter for screening mammogram for malignant neoplasm of breast: Secondary | ICD-10-CM

## 2018-02-28 ENCOUNTER — Ambulatory Visit
Admission: RE | Admit: 2018-02-28 | Discharge: 2018-02-28 | Disposition: A | Discharge status: Home / Self Care | Payer: Medicare HMO | Source: Ambulatory Visit | Attending: Family Medicine | Admitting: Family Medicine

## 2018-02-28 DIAGNOSIS — Z1231 Encounter for screening mammogram for malignant neoplasm of breast: Secondary | ICD-10-CM | POA: Diagnosis not present

## 2018-03-01 ENCOUNTER — Other Ambulatory Visit: Payer: Self-pay | Admitting: Family Medicine

## 2018-03-01 DIAGNOSIS — R928 Other abnormal and inconclusive findings on diagnostic imaging of breast: Secondary | ICD-10-CM

## 2018-03-08 ENCOUNTER — Telehealth: Payer: Self-pay | Admitting: Family Medicine

## 2018-03-08 DIAGNOSIS — E039 Hypothyroidism, unspecified: Secondary | ICD-10-CM

## 2018-03-08 DIAGNOSIS — I10 Essential (primary) hypertension: Secondary | ICD-10-CM

## 2018-03-08 DIAGNOSIS — M810 Age-related osteoporosis without current pathological fracture: Secondary | ICD-10-CM

## 2018-03-08 DIAGNOSIS — E119 Type 2 diabetes mellitus without complications: Secondary | ICD-10-CM

## 2018-03-08 DIAGNOSIS — E7800 Pure hypercholesterolemia, unspecified: Secondary | ICD-10-CM

## 2018-03-08 NOTE — Telephone Encounter (Signed)
-----   Message from Ellamae Sia sent at 03/08/2018 10:04 AM EDT ----- Regarding: Lab orders for Thursdday, 4.11.19 Lab orders for a 6 month follow up appt

## 2018-03-15 ENCOUNTER — Other Ambulatory Visit (INDEPENDENT_AMBULATORY_CARE_PROVIDER_SITE_OTHER): Payer: Medicare HMO

## 2018-03-15 DIAGNOSIS — E7800 Pure hypercholesterolemia, unspecified: Secondary | ICD-10-CM

## 2018-03-15 DIAGNOSIS — I10 Essential (primary) hypertension: Secondary | ICD-10-CM | POA: Diagnosis not present

## 2018-03-15 DIAGNOSIS — E039 Hypothyroidism, unspecified: Secondary | ICD-10-CM | POA: Diagnosis not present

## 2018-03-15 DIAGNOSIS — E119 Type 2 diabetes mellitus without complications: Secondary | ICD-10-CM

## 2018-03-15 DIAGNOSIS — M810 Age-related osteoporosis without current pathological fracture: Secondary | ICD-10-CM

## 2018-03-15 LAB — CBC WITH DIFFERENTIAL/PLATELET
BASOS ABS: 0.1 10*3/uL (ref 0.0–0.1)
Basophils Relative: 1.2 % (ref 0.0–3.0)
EOS PCT: 2.2 % (ref 0.0–5.0)
Eosinophils Absolute: 0.1 10*3/uL (ref 0.0–0.7)
HEMATOCRIT: 35.1 % — AB (ref 36.0–46.0)
Hemoglobin: 12 g/dL (ref 12.0–15.0)
LYMPHS PCT: 33.1 % (ref 12.0–46.0)
Lymphs Abs: 2.1 10*3/uL (ref 0.7–4.0)
MCHC: 34.3 g/dL (ref 30.0–36.0)
MCV: 89.8 fl (ref 78.0–100.0)
Monocytes Absolute: 0.7 10*3/uL (ref 0.1–1.0)
Monocytes Relative: 10.9 % (ref 3.0–12.0)
NEUTROS ABS: 3.3 10*3/uL (ref 1.4–7.7)
Neutrophils Relative %: 52.6 % (ref 43.0–77.0)
PLATELETS: 278 10*3/uL (ref 150.0–400.0)
RBC: 3.91 Mil/uL (ref 3.87–5.11)
RDW: 14.1 % (ref 11.5–15.5)
WBC: 6.2 10*3/uL (ref 4.0–10.5)

## 2018-03-15 LAB — VITAMIN D 25 HYDROXY (VIT D DEFICIENCY, FRACTURES): VITD: 14.83 ng/mL — ABNORMAL LOW (ref 30.00–100.00)

## 2018-03-15 LAB — COMPREHENSIVE METABOLIC PANEL
ALT: 9 U/L (ref 0–35)
AST: 13 U/L (ref 0–37)
Albumin: 4.6 g/dL (ref 3.5–5.2)
Alkaline Phosphatase: 46 U/L (ref 39–117)
BILIRUBIN TOTAL: 0.4 mg/dL (ref 0.2–1.2)
BUN: 26 mg/dL — ABNORMAL HIGH (ref 6–23)
CALCIUM: 9.3 mg/dL (ref 8.4–10.5)
CO2: 24 meq/L (ref 19–32)
Chloride: 106 mEq/L (ref 96–112)
Creatinine, Ser: 0.93 mg/dL (ref 0.40–1.20)
GFR: 62.29 mL/min (ref >60.00)
Glucose, Bld: 141 mg/dL — ABNORMAL HIGH (ref 70–99)
POTASSIUM: 3.6 meq/L (ref 3.5–5.1)
Sodium: 140 mEq/L (ref 135–145)
Total Protein: 7.6 g/dL (ref 6.0–8.3)

## 2018-03-15 LAB — LIPID PANEL
CHOLESTEROL: 198 mg/dL (ref 0–200)
HDL: 30.7 mg/dL — ABNORMAL LOW (ref >39.00)
NonHDL: 167.66
Total CHOL/HDL Ratio: 6
Triglycerides: 249 mg/dL — ABNORMAL HIGH (ref 0.0–149.0)
VLDL: 49.8 mg/dL — ABNORMAL HIGH (ref 0.0–40.0)

## 2018-03-15 LAB — LDL CHOLESTEROL, DIRECT: Direct LDL: 115 mg/dL

## 2018-03-15 LAB — HEMOGLOBIN A1C: Hgb A1c MFr Bld: 5.4 % (ref 4.6–6.5)

## 2018-03-15 LAB — TSH: TSH: 4 u[IU]/mL (ref 0.35–4.50)

## 2018-03-16 ENCOUNTER — Ambulatory Visit
Admission: RE | Admit: 2018-03-16 | Discharge: 2018-03-16 | Disposition: A | Discharge status: Home / Self Care | Payer: Medicare HMO | Source: Ambulatory Visit | Attending: Family Medicine | Admitting: Family Medicine

## 2018-03-16 DIAGNOSIS — N6489 Other specified disorders of breast: Secondary | ICD-10-CM | POA: Diagnosis not present

## 2018-03-16 DIAGNOSIS — R928 Other abnormal and inconclusive findings on diagnostic imaging of breast: Secondary | ICD-10-CM

## 2018-03-20 ENCOUNTER — Encounter: Payer: Self-pay | Admitting: Family Medicine

## 2018-03-20 ENCOUNTER — Ambulatory Visit (INDEPENDENT_AMBULATORY_CARE_PROVIDER_SITE_OTHER): Payer: Medicare HMO | Admitting: Family Medicine

## 2018-03-20 VITALS — BP 126/82 | HR 56 | Temp 97.4°F | Ht 64.0 in | Wt 156.5 lb

## 2018-03-20 DIAGNOSIS — E559 Vitamin D deficiency, unspecified: Secondary | ICD-10-CM

## 2018-03-20 DIAGNOSIS — E039 Hypothyroidism, unspecified: Secondary | ICD-10-CM

## 2018-03-20 DIAGNOSIS — E7800 Pure hypercholesterolemia, unspecified: Secondary | ICD-10-CM

## 2018-03-20 DIAGNOSIS — R21 Rash and other nonspecific skin eruption: Secondary | ICD-10-CM | POA: Insufficient documentation

## 2018-03-20 DIAGNOSIS — I10 Essential (primary) hypertension: Secondary | ICD-10-CM | POA: Diagnosis not present

## 2018-03-20 DIAGNOSIS — E119 Type 2 diabetes mellitus without complications: Secondary | ICD-10-CM | POA: Diagnosis not present

## 2018-03-20 MED ORDER — ALENDRONATE SODIUM 70 MG PO TABS: ORAL_TABLET | ORAL | 3 refills | Status: DC

## 2018-03-20 MED ORDER — ERGOCALCIFEROL 1.25 MG (50000 UT) PO CAPS: 50000.0000 [IU] | ORAL_CAPSULE | ORAL | 0 refills | Status: DC

## 2018-03-20 NOTE — Assessment & Plan Note (Signed)
bp in fair control at this time  BP Readings from Last 1 Encounters:  03/20/18 126/82   No changes needed Disc lifstyle change with low sodium diet and exercise  Labs rev  Enc to drink more water

## 2018-03-20 NOTE — Assessment & Plan Note (Signed)
Lipids are up -suspect from dietary indiscretion  Disc goals for lipids and reasons to control them Rev labs with pt Rev low sat fat diet in detail Continues gemfibrozil for trig Intol of statins  Rev diet -and easy read handout given Urged less beef and fatty pork

## 2018-03-20 NOTE — Progress Notes (Signed)
Subjective:    Patient ID: Barbara [name], female    DOB: 1941-05-26, 77 y.o.   MRN: [id number]  HPI Here for f/u of chronic health problems   Feeling good   Wt Readings from Last 3 Encounters:  03/20/18 156 lb 8 oz (71 kg)  09/01/17 147 lb 8 oz (66.9 kg)  10/12/16 138 lb 8 oz (62.8 kg)  weight is up - has been eating (leveling out her wt)  Eating healthy  Not a lot of exercise in cold weather  26.86 kg/m    bp is stable today  No cp or palpitations or headaches or edema  No side effects to medicines  BP Readings from Last 3 Encounters:  03/20/18 126/82  09/19/17 122/72  09/01/17 124/70    Lab Results  Component Value Date   CREATININE 0.93 03/15/2018   BUN 26 (H) 03/15/2018   NA 140 03/15/2018   K 3.6 03/15/2018   CL 106 03/15/2018   CO2 24 03/15/2018   Lab Results  Component Value Date   ALT 9 03/15/2018   AST 13 03/15/2018   ALKPHOS 46 03/15/2018   BILITOT 0.4 03/15/2018    Lab Results  Component Value Date   WBC 6.2 03/15/2018   HGB 12.0 03/15/2018   HCT 35.1 (L) 03/15/2018   MCV 89.8 03/15/2018   PLT 278.0 03/15/2018    may need to drink more water   DM2 Lab Results  Component Value Date   HGBA1C 5.4 03/15/2018  same as last check  Ace for renal prot Metformin=good control with this  Diet - watches sugar very carefully  Eye exam is utd from fall  She will exercise more in the spring/summer   Hypothyroidism  Pt has no clinical changes No change in energy level/ hair or skin/ edema and no tremor Lab Results  Component Value Date   TSH 4.00 03/15/2018     Cholesterol  Lab Results  Component Value Date   CHOL 198 03/15/2018   CHOL 207 (H) 09/01/2017   CHOL 197 07/27/2016   Lab Results  Component Value Date   HDL 30.70 (L) 03/15/2018   HDL 33.50 (L) 09/01/2017   HDL 36.90 (L) 07/27/2016   Lab Results  Component Value Date   LDLCALC 134 (H) 09/01/2017   LDLCALC 118 (H) 06/24/2014   Lab Results  Component Value Date   TRIG 249.0 (H) 03/15/2018   TRIG 197.0 (H) 09/01/2017   TRIG 201.0 (H) 07/27/2016   Lab Results  Component Value Date   CHOLHDL 6 03/15/2018   CHOLHDL 6 09/01/2017   CHOLHDL 5 07/27/2016   Lab Results  Component Value Date   LDLDIRECT 115.0 03/15/2018   LDLDIRECT 113.0 07/27/2016   LDLDIRECT 112.0 07/06/2015  on gemfibrozil for triglycerides Cannot tolerate statins  Diet -she does eat red meat/hamburger  Some sausage and bacon  occ fries pork chops  Some french fries or chips   Vit D level is low at 14.8   Hypothyroidism  Pt has no clinical changes No change in energy level/ hair or skin/ edema and no tremor Lab Results  Component Value Date   TSH 4.00 03/15/2018     Patient Active Problem List   Diagnosis Date Noted  . Vitamin D deficiency 03/20/2018  . Rash of face 03/20/2018  . Osteoporosis 09/25/2016  . Encounter for screening mammogram for breast cancer 09/13/2016  . Estrogen deficiency 09/13/2016  . History of shingles 07/27/2016  . Routine general medical examination  at a health care facility 07/08/2015  . Vaginal atrophy 07/01/2013  . Encounter for Medicare annual wellness exam 07/01/2013  . Colon cancer screening 06/26/2012  . Hypothyroid 06/18/2012  . Encounter for routine gynecological examination 02/15/2012  . Diabetes type 2, controlled ([place]) 06/05/2007  . HYPERCHOLESTEROLEMIA 06/05/2007  . Generalized anxiety disorder 06/05/2007  . EXPRESSIVE LANGUAGE DISORDER 06/05/2007  . ISCHEMIA, RETINAL 06/05/2007  . Essential hypertension 06/05/2007  . ALLERGIC RHINITIS 06/05/2007   Past Medical History:  Diagnosis Date  . Allergy   . Anemia   . Anxiety   . Diabetes mellitus   . Expressive language disorder   . GERD (gastroesophageal reflux disease)   . Hyperlipidemia   . Hypertension   . Learning disability   . Mentally disabled   . Obesity   . Retinal ischemia    history of   Past Surgical History:  Procedure Laterality Date  . CATARACT  EXTRACTION Left 2014  . CHOLECYSTECTOMY     Social History   Tobacco Use  . Smoking status: Never Smoker  . Smokeless tobacco: Never Used  Substance Use Topics  . Alcohol use: No    Alcohol/week: 0.0 oz  . Drug use: No   Family History  Problem Relation Age of Onset  . Heart disease Father   . Hypertension Father   . Heart disease Brother   . Diabetes Brother    Allergies  Allergen Reactions  . Atorvastatin     REACTION: increased liver fxn tests  . Ezetimibe     REACTION: stomach upset/malaise  . Sulfonamide Derivatives     REACTION: rash   Current Outpatient Medications on File Prior to Visit  Medication Sig Dispense Refill  . acetaminophen (TYLENOL) 500 MG tablet Take 500 mg by mouth every 6 (six) hours as needed.      Marland Kitchen amLODipine (NORVASC) 10 MG tablet Take 1 tablet (10 mg total) by mouth daily. 90 tablet 3  . bisoprolol-hydrochlorothiazide (ZIAC) 10-6.25 MG tablet Take 1 tablet by mouth 2 (two) times daily. 180 tablet 3  . clotrimazole-betamethasone (LOTRISONE) cream Apply topically daily as needed.      . ferrous sulfate 325 (65 FE) MG tablet Take 1 tablet (325 mg total) by mouth daily with breakfast.  3  . fexofenadine (ALLEGRA) 180 MG tablet Take 180 mg by mouth as needed.    . fluticasone (FLONASE) 50 MCG/ACT nasal spray USE TWO SPRAYS IN EACH NOSTRIL DAILY 48 g 3  . gemfibrozil (LOPID) 600 MG tablet Take 1 tablet (600 mg total) by mouth 2 (two) times daily. 180 tablet 3  . Glucose Blood DISK by In Vitro route.      Marland Kitchen levothyroxine (SYNTHROID, LEVOTHROID) 100 MCG tablet Take 1 tablet (100 mcg total) by mouth daily. 90 tablet 3  . metFORMIN (GLUCOPHAGE) 500 MG tablet Take 1 tablet (500 mg total) by mouth daily with breakfast. 90 tablet 3  . omeprazole (PRILOSEC) 20 MG capsule TAKE ONE CAPSULE BY MOUTH DAILY BEFORE BREAKFAST 90 capsule 3  . quinapril (ACCUPRIL) 40 MG tablet TAKE ONE-HALF (0.5) BY MOUTH DAILY 45 tablet 3  . sertraline (ZOLOFT) 50 MG tablet Take 1  tablet (50 mg total) by mouth daily. 90 tablet 3  . vitamin B-12 (CYANOCOBALAMIN) 1000 MCG tablet Take 1 tablet (1,000 mcg total) by mouth daily.     No current facility-administered medications on file prior to visit.     Review of Systems  Constitutional: Negative for activity change, appetite change, fatigue, fever and  unexpected weight change.  HENT: Negative for congestion, ear pain, rhinorrhea, sinus pressure and sore throat.   Eyes: Positive for visual disturbance. Negative for pain and redness.       Baseline blind in R eye   Respiratory: Negative for cough, shortness of breath and wheezing.   Cardiovascular: Negative for chest pain and palpitations.  Gastrointestinal: Negative for abdominal pain, blood in stool, constipation and diarrhea.  Endocrine: Negative for polydipsia and polyuria.  Genitourinary: Negative for dysuria, frequency and urgency.  Musculoskeletal: Negative for arthralgias, back pain and myalgias.  Skin: Negative for pallor and rash.  Allergic/Immunologic: Negative for environmental allergies.  Neurological: Negative for dizziness, syncope and headaches.  Hematological: Negative for adenopathy. Does not bruise/bleed easily.  Psychiatric/Behavioral: Negative for decreased concentration and dysphoric mood. The patient is not nervous/anxious.        Objective:   Physical Exam  Constitutional: She appears well-developed and well-nourished. No distress.  overwt and well app (wt gain noticed)  HENT:  Head: Normocephalic and atraumatic.  Mouth/Throat: Oropharynx is clear and moist.  Eyes: Pupils are equal, round, and reactive to light. Conjunctivae and EOM are normal. No scleral icterus.  Baseline while light reflex in R eye- (no vision in that eye)  Neck: Normal range of motion. Neck supple. No JVD present. Carotid bruit is not present. No thyromegaly present.  Cardiovascular: Normal rate, regular rhythm, normal heart sounds and intact distal pulses. Exam reveals  no gallop.  Pulmonary/Chest: Effort normal and breath sounds normal. No respiratory distress. She has no wheezes. She has no rales.  No crackles  Abdominal: Soft. Bowel sounds are normal. She exhibits no distension, no abdominal bruit and no mass. There is no tenderness.  Musculoskeletal: She exhibits no edema.  Lymphadenopathy:    She has no cervical adenopathy.  Neurological: She is alert. She has normal reflexes.  Skin: Skin is warm and dry. Rash noted. No erythema. No pallor.  Resolving rash on R side of face (? Dried vesicles) -  Possibly healed shingles   Psychiatric: She has a normal mood and affect.          Assessment & Plan:   Problem List Items Addressed This Visit      Cardiovascular and Mediastinum   Essential hypertension    bp in fair control at this time  BP Readings from Last 1 Encounters:  03/20/18 126/82   No changes needed Disc lifstyle change with low sodium diet and exercise  Labs rev  Enc to drink more water         Endocrine   Diabetes type 2, controlled ([place]) - Primary    Lab Results  Component Value Date   HGBA1C 5.4 03/15/2018   Stable and well controlled with metformin  Eye care utd (baseline blind R eye)  Foot care disc Diet disc  Ace for renal prot F/u 6 mo  Wt is up -watching this      Hypothyroid    Hypothyroidism  Pt has no clinical changes No change in energy level/ hair or skin/ edema and no tremor Lab Results  Component Value Date   TSH 4.00 03/15/2018            Musculoskeletal and Integument   Rash of face    R sided facial rash-almost entirely faded/dried up  Suspect this may have been zoster-but per pt no pain  Will watch  She is already blind in R eye  Enc to alert [place] if she develops pain or  worse rash        Other   HYPERCHOLESTEROLEMIA    Lipids are up -suspect from dietary indiscretion  Disc goals for lipids and reasons to control them Rev labs with pt Rev low sat fat diet in detail Continues  gemfibrozil for trig Intol of statins  Rev diet -and easy read handout given Urged less beef and fatty pork        Vitamin D deficiency    Low D at 14.8 (in pt with known low bone density)  Urged her to get ca plus D otc  Px ergocalciferol for 12 weeks-weekly  Handouts given on ergocalciferol and vit D def

## 2018-03-20 NOTE — Assessment & Plan Note (Signed)
R sided facial rash-almost entirely faded/dried up  Suspect this may have been zoster-but per pt no pain  Will watch  She is already blind in R eye  Enc to alert [place] if she develops pain or worse rash

## 2018-03-20 NOTE — Assessment & Plan Note (Signed)
Lab Results  Component Value Date   HGBA1C 5.4 03/15/2018   Stable and well controlled with metformin  Eye care utd (baseline blind R eye)  Foot care disc Diet disc  Ace for renal prot F/u 6 mo  Wt is up -watching this

## 2018-03-20 NOTE — Patient Instructions (Addendum)
Aim for 64 oz fluids per day to keep kidneys healthy (mostly water) - avoid sugary drinks    For cholesterol --Avoid red meat/ fried foods/ egg yolks/ fatty breakfast meats/ butter, cheese and high fat dairy/ and shellfish    For bone health  Take the px vitamin D (ergocalciferol) for 12 weeks as directed  Also get calcium and vitamin D over the counter (oscal or caltrate are good brands) twice daily  Try to get 1200-1500 mg of calcium per day with at least 1000 iu of vitamin D - for bone health   Take care of yourself   Follow up in 6 months for annual exam

## 2018-03-20 NOTE — Assessment & Plan Note (Signed)
Low D at 14.8 (in pt with known low bone density)  Urged her to get ca plus D otc  Px ergocalciferol for 12 weeks-weekly  Handouts given on ergocalciferol and vit D def

## 2018-03-20 NOTE — Assessment & Plan Note (Signed)
Hypothyroidism  Pt has no clinical changes No change in energy level/ hair or skin/ edema and no tremor Lab Results  Component Value Date   TSH 4.00 03/15/2018     

## 2018-08-07 DIAGNOSIS — H5440 Blindness, one eye, unspecified eye: Secondary | ICD-10-CM | POA: Diagnosis not present

## 2018-08-30 DIAGNOSIS — R69 Illness, unspecified: Secondary | ICD-10-CM | POA: Diagnosis not present

## 2018-09-04 ENCOUNTER — Other Ambulatory Visit: Payer: Self-pay | Admitting: Family Medicine

## 2018-09-25 DIAGNOSIS — E119 Type 2 diabetes mellitus without complications: Secondary | ICD-10-CM | POA: Diagnosis not present

## 2018-09-26 ENCOUNTER — Ambulatory Visit (INDEPENDENT_AMBULATORY_CARE_PROVIDER_SITE_OTHER): Payer: Medicare HMO

## 2018-09-26 VITALS — BP 112/64 | HR 52 | Temp 97.3°F | Ht 63.5 in | Wt 144.5 lb

## 2018-09-26 DIAGNOSIS — E119 Type 2 diabetes mellitus without complications: Secondary | ICD-10-CM

## 2018-09-26 DIAGNOSIS — E559 Vitamin D deficiency, unspecified: Secondary | ICD-10-CM

## 2018-09-26 DIAGNOSIS — Z0000 Encounter for general adult medical examination without abnormal findings: Secondary | ICD-10-CM

## 2018-09-26 LAB — HEMOGLOBIN A1C: HEMOGLOBIN A1C: 5.4 % (ref 4.6–6.5)

## 2018-09-26 LAB — VITAMIN D 25 HYDROXY (VIT D DEFICIENCY, FRACTURES): VITD: 24.17 ng/mL — ABNORMAL LOW (ref 30.00–100.00)

## 2018-09-26 NOTE — Progress Notes (Signed)
PCP notes:   Health maintenance:  Foot exam - PCP follow-up requested Tetanus vaccine - postponed/insurance  Abnormal screenings:   Hearing - failed  Hearing Screening   125Hz  250Hz  500Hz  1000Hz  2000Hz  3000Hz  4000Hz  6000Hz  8000Hz   Right ear:   40 40 40  0    Left ear:   40 40 40  0     Mini-Cog score: 19/20 MMSE - Mini Mental State Exam 09/26/2018 09/01/2017 08/30/2016  Orientation to time 5 5 3   Orientation to time comments - - pt was unable to provide correct year despite multiple cues  Orientation to Place 5 5 5   Registration 3 3 3   Attention/ Calculation 0 0 0  Recall 3 3 3   Language- name 2 objects 0 0 0  Language- repeat 1 1 1   Language- follow 3 step command 2 3 3   Language- follow 3 step command-comments unable to follow 1 step of 3 step command - -  Language- read & follow direction 0 0 0  Write a sentence 0 0 0  Copy design 0 0 0  Total score 19 20 18      Patient concerns:   None  Nurse concerns:  None  Next PCP appt:   10/03/18 @ 1030  I reviewed health advisor's note, was available for consultation, and agree with documentation and plan. Loura Pardon MD

## 2018-09-26 NOTE — Patient Instructions (Signed)
Ms. Shorty , Thank you for taking time to come for your Medicare Wellness Visit. I appreciate your ongoing commitment to your health goals. Please review the following plan we discussed and let me know if I can assist you in the future.   These are the goals we discussed: Goals    . Increase water intake     Starting 09/26/2018, I will attempt to drink at least 6-8 glasses of water daily.        This is a list of the screening recommended for you and due dates:  Health Maintenance  Topic Date Due  . Complete foot exam   10/03/2018*  . Pap Smear  09/02/2019*  . Tetanus Vaccine  12/05/2019*  . Mammogram  03/01/2019  . Hemoglobin A1C  03/28/2019  . Eye exam for diabetics  09/14/2019  . Flu Shot  Completed  . DEXA scan (bone density measurement)  Completed  . Pneumonia vaccines  Completed  *Topic was postponed. The date shown is not the original due date.   Preventive Care for Adults  A healthy lifestyle and preventive care can promote health and wellness. Preventive health guidelines for adults include the following key practices.  . A routine yearly physical is a good way to check with your health care provider about your health and preventive screening. It is a chance to share any concerns and updates on your health and to receive a thorough exam.  . Visit your dentist for a routine exam and preventive care every 6 months. Brush your teeth twice a day and floss once a day. Good oral hygiene prevents tooth decay and gum disease.  . The frequency of eye exams is based on your age, health, family medical history, use  of contact lenses, and other factors. Follow your health care provider's recommendations for frequency of eye exams.  . Eat a healthy diet. Foods like vegetables, fruits, whole grains, low-fat dairy products, and lean protein foods contain the nutrients you need without too many calories. Decrease your intake of foods high in solid fats, added sugars, and salt. Eat the  right amount of calories for you. Get information about a proper diet from your health care provider, if necessary.  . Regular physical exercise is one of the most important things you can do for your health. Most adults should get at least 150 minutes of moderate-intensity exercise (any activity that increases your heart rate and causes you to sweat) each week. In addition, most adults need muscle-strengthening exercises on 2 or more days a week.  Silver Sneakers may be a benefit available to you. To determine eligibility, you may visit the website: www.silversneakers.com or contact program at (859)214-6629 Mon-Fri between 8AM-8PM.   . Maintain a healthy weight. The body mass index (BMI) is a screening tool to identify possible weight problems. It provides an estimate of body fat based on height and weight. Your health care provider can find your BMI and can help you achieve or maintain a healthy weight.   For adults 20 years and older: ? A BMI below 18.5 is considered underweight. ? A BMI of 18.5 to 24.9 is normal. ? A BMI of 25 to 29.9 is considered overweight. ? A BMI of 30 and above is considered obese.   . Maintain normal blood lipids and cholesterol levels by exercising and minimizing your intake of saturated fat. Eat a balanced diet with plenty of fruit and vegetables. Blood tests for lipids and cholesterol should begin at age 34  and be repeated every 5 years. If your lipid or cholesterol levels are high, you are over 50, or you are at high risk for heart disease, you may need your cholesterol levels checked more frequently. Ongoing high lipid and cholesterol levels should be treated with medicines if diet and exercise are not working.  . If you smoke, find out from your health care provider how to quit. If you do not use tobacco, please do not start.  . If you choose to drink alcohol, please do not consume more than 2 drinks per day. One drink is considered to be 12 ounces (355 mL) of  beer, 5 ounces (148 mL) of wine, or 1.5 ounces (44 mL) of liquor.  . If you are 52-14 years old, ask your health care provider if you should take aspirin to prevent strokes.  . Use sunscreen. Apply sunscreen liberally and repeatedly throughout the day. You should seek shade when your shadow is shorter than you. Protect yourself by wearing long sleeves, pants, a wide-brimmed hat, and sunglasses year round, whenever you are outdoors.  . Once a month, do a whole body skin exam, using a mirror to look at the skin on your back. Tell your health care provider of new moles, moles that have irregular borders, moles that are larger than a pencil eraser, or moles that have changed in shape or color.

## 2018-09-26 NOTE — Progress Notes (Signed)
Subjective:   Delainie Chavana is a 77 y.o. female who presents for Medicare Annual (Subsequent) preventive examination.  Review of Systems:  N/A Cardiac Risk Factors include: diabetes mellitus;advanced age (>65men, >52 women);dyslipidemia;hypertension     Objective:     Vitals: BP 112/64 (BP Location: Right Arm, Patient Position: Sitting, Cuff Size: Normal)   Pulse (!) 52   Temp (!) 97.3 F (36.3 C) (Oral)   Ht 5' 3.5" (1.613 m) Comment: no shoes  Wt 144 lb 8 oz (65.5 kg)   SpO2 95%   BMI 25.20 kg/m   Body mass index is 25.2 kg/m.  Advanced Directives 09/26/2018 09/01/2017 08/30/2016  Does Patient Have a Medical Advance Directive? No Yes Yes  Type of Advance Directive - [place];Living will [place];Living will  Does patient want to make changes to medical advance directive? - - No - Patient declined  Copy of [place] in Chart? - No - copy requested No - copy requested  Would patient like information on creating a medical advance directive? No - Patient declined - -    Tobacco Social History   Tobacco Use  Smoking Status Never Smoker  Smokeless Tobacco Never Used     Counseling given: No   Clinical Intake:  Pre-visit preparation completed: Yes  Pain : No/denies pain Pain Score: 0-No pain     Nutritional Status: BMI 25 -29 Overweight Nutritional Risks: None Diabetes: Yes CBG done?: No Did pt. bring in CBG monitor from home?: No  How often do you need to have someone help you when you read instructions, pamphlets, or other written materials from your doctor or pharmacy?: 1 - Never What is the last grade level you completed in school?: 9th grade  Interpreter Needed?: No  Comments: pt lives alone Information entered by :: LPinson, LPN  Past Medical History:  Diagnosis Date  . Allergy   . Anemia   . Anxiety   . Diabetes mellitus   . Expressive language disorder   . GERD (gastroesophageal  reflux disease)   . Hyperlipidemia   . Hypertension   . Learning disability   . Mentally disabled   . Obesity   . Retinal ischemia    history of   Past Surgical History:  Procedure Laterality Date  . CATARACT EXTRACTION Left 2014  . CHOLECYSTECTOMY     Family History  Problem Relation Age of Onset  . Heart disease Father   . Hypertension Father   . Heart disease Brother   . Diabetes Brother    Social History   Socioeconomic History  . Marital status: Divorced    Spouse name: Not on file  . Number of children: 0  . Years of education: 9th grade  . Highest education level: Not on file  Occupational History  . Occupation: disabled    Fish farm manager: Not Employed  . Occupation: Disabled    Fish farm manager: Not Employed  Social Needs  . Financial resource strain: Not on file  . Food insecurity:    Worry: Not on file    Inability: Not on file  . Transportation needs:    Medical: Not on file    Non-medical: Not on file  Tobacco Use  . Smoking status: Never Smoker  . Smokeless tobacco: Never Used  Substance and Sexual Activity  . Alcohol use: No    Alcohol/week: 0.0 standard drinks  . Drug use: No  . Sexual activity: Never  Lifestyle  . Physical activity:  Days per week: Not on file    Minutes per session: Not on file  . Stress: Not on file  Relationships  . Social connections:    Talks on phone: Not on file    Gets together: Not on file    Attends religious service: Not on file    Active member of club or organization: Not on file    Attends meetings of clubs or organizations: Not on file    Relationship status: Not on file  Other Topics Concern  . Not on file  Social History Narrative   Ex-Husband has pschizoaffective disorder, family close by/ lot of support, works in garden   Disabled - mental retardation   Sister Arty Baumgartner is guardian    Outpatient Encounter Medications as of 09/26/2018  Medication Sig  . acetaminophen (TYLENOL) 500 MG tablet Take 500 mg by  mouth every 6 (six) hours as needed.    Marland Kitchen alendronate (FOSAMAX) 70 MG tablet TAKE 1 TABLET BY MOUTH 30 MINUTES BEFOREBREAKFAST ONCE A WEEK WITH ATLEAST 8 OZ. OF WATER.  Marland Kitchen amLODipine (NORVASC) 10 MG tablet Take 1 tablet (10 mg total) by mouth daily.  . bisoprolol-hydrochlorothiazide (ZIAC) 10-6.25 MG tablet Take 1 tablet by mouth 2 (two) times daily.  . Calcium Carb-Cholecalciferol (CALCIUM + VITAMIN D3 PO) Take 1 tablet by mouth daily.  . clotrimazole-betamethasone (LOTRISONE) cream Apply topically daily as needed.    . ergocalciferol (VITAMIN D2) 50000 units capsule Take 1 capsule (50,000 Units total) by mouth once a week.  . ferrous sulfate 325 (65 FE) MG tablet Take 1 tablet (325 mg total) by mouth daily with breakfast.  . fexofenadine (ALLEGRA) 180 MG tablet Take 180 mg by mouth as needed.  . fluticasone (FLONASE) 50 MCG/ACT nasal spray USE TWO SPRAYS IN EACH NOSTRIL DAILY  . gemfibrozil (LOPID) 600 MG tablet TAKE 1 TABLET BY MOUTH TWICE A DAY  . Glucose Blood DISK by In Vitro route.    Marland Kitchen levothyroxine (SYNTHROID, LEVOTHROID) 100 MCG tablet Take 1 tablet (100 mcg total) by mouth daily.  . metFORMIN (GLUCOPHAGE) 500 MG tablet Take 1 tablet (500 mg total) by mouth daily with breakfast.  . omeprazole (PRILOSEC) 20 MG capsule TAKE ONE CAPSULE BY MOUTH DAILY BEFORE BREAKFAST  . quinapril (ACCUPRIL) 40 MG tablet TAKE ONE-HALF (0.5) BY MOUTH DAILY  . sertraline (ZOLOFT) 50 MG tablet Take 1 tablet (50 mg total) by mouth daily.  . vitamin B-12 (CYANOCOBALAMIN) 1000 MCG tablet Take 1 tablet (1,000 mcg total) by mouth daily.   No facility-administered encounter medications on file as of 09/26/2018.     Activities of Daily Living In your present state of health, do you have any difficulty performing the following activities: 09/26/2018  Hearing? N  Vision? Y  Difficulty concentrating or making decisions? N  Walking or climbing stairs? N  Dressing or bathing? N  Doing errands, shopping? N    Preparing Food and eating ? N  Using the Toilet? N  In the past six months, have you accidently leaked urine? N  Do you have problems with loss of bowel control? N  Managing your Medications? N  Managing your Finances? N  Housekeeping or managing your Housekeeping? N  Some recent data might be hidden    Patient Care Team: Tower, Wynelle Fanny, MD as PCP - General Druscilla Brownie, MD as Consulting Physician (Dermatology)    Assessment:   This is a routine wellness examination for [place].   Hearing Screening   125Hz  250Hz   500Hz  1000Hz  2000Hz  3000Hz  4000Hz  6000Hz  8000Hz   Right ear:   40 40 40  0    Left ear:   40 40 40  0    Vision Screening Comments: Vision exam in Oct 2019 with Dr. Jeralyn Ruths    Exercise Activities and Dietary recommendations Current Exercise Habits: The patient does not participate in regular exercise at present, Exercise limited by: None identified  Goals    . Increase water intake     Starting 09/26/2018, I will attempt to drink at least 6-8 glasses of water daily.        Fall Risk Fall Risk  09/26/2018 09/01/2017 08/30/2016 07/08/2015 07/01/2014  Falls in the past year? No Yes Yes Yes -  Comment - pt stated she tripped over her own feet - - -  Number falls in past yr: - 2 or more 1 1 -  Injury with Fall? - No No No No  Risk for fall due to : - - - - Impaired vision  Follow up - - Falls evaluation completed - -   Depression Screen PHQ 2/9 Scores 09/26/2018 09/01/2017 08/30/2016 07/08/2015  PHQ - 2 Score 0 0 0 0  PHQ- 9 Score 0 0 - -     Cognitive Function MMSE - Mini Mental State Exam 09/26/2018 09/01/2017 08/30/2016  Orientation to time 5 5 3   Orientation to time comments - - pt was unable to provide correct year despite multiple cues  Orientation to Place 5 5 5   Registration 3 3 3   Attention/ Calculation 0 0 0  Recall 3 3 3   Language- name 2 objects 0 0 0  Language- repeat 1 1 1   Language- follow 3 step command 2 3 3   Language- follow 3 step  command-comments unable to follow 1 step of 3 step command - -  Language- read & follow direction 0 0 0  Write a sentence 0 0 0  Copy design 0 0 0  Total score 19 20 18      PLEASE NOTE: A Mini-Cog screen was completed. Maximum score is 20. A value of 0 denotes this part of [place] MMSE was not completed or the patient failed this part of the Mini-Cog screening.   Mini-Cog Screening Orientation to Time - Max 5 pts Orientation to Place - Max 5 pts Registration - Max 3 pts Recall - Max 3 pts Language Repeat - Max 1 pts Language Follow 3 Step Command - Max 3 pts     Immunization History  Administered Date(s) Administered  . Influenza Split 12/04/2011, 09/28/2012  . Influenza Whole 09/08/2009, 09/08/2010  . Influenza, High Dose Seasonal PF 08/30/2018  . Influenza,inj,Quad PF,6+ Mos 08/30/2016, 09/01/2017  . Influenza-Unspecified 10/11/2013, 09/24/2014, 10/21/2015  . Pneumococcal Conjugate-13 07/01/2014  . Pneumococcal Polysaccharide-23 12/08/2009  . Td 02/07/2008  . Zoster 04/18/2012    Screening Tests Health Maintenance  Topic Date Due  . FOOT EXAM  10/03/2018 (Originally 09/19/2018)  . PAP SMEAR  09/02/2019 (Originally 07/02/2015)  . TETANUS/TDAP  12/05/2019 (Originally 02/06/2018)  . MAMMOGRAM  03/01/2019  . HEMOGLOBIN A1C  03/28/2019  . OPHTHALMOLOGY EXAM  09/14/2019  . INFLUENZA VACCINE  Completed  . DEXA SCAN  Completed  . PNA vac Low Risk Adult  Completed      Plan:  I have personally reviewed, addressed, and noted the following in the patient's chart:  A. Medical and social history B. Use of alcohol, tobacco or illicit drugs  C. Current medications and supplements D. Functional ability and  status E.  Nutritional status F.  Physical activity G. Advance directives H. List of other physicians I.  Hospitalizations, surgeries, and ER visits in previous 12 months J.  [place] to include hearing, vision, cognitive, depression L. Referrals and  appointments - none  In addition, I have reviewed and discussed with patient certain preventive protocols, quality metrics, and best practice recommendations. A written personalized care plan for preventive services as well as general preventive health recommendations were provided to patient.  See attached scanned questionnaire for additional information.   Signed,   Lindell Noe, MHA, BS, LPN Health Coach

## 2018-10-03 ENCOUNTER — Encounter: Payer: Self-pay | Admitting: Family Medicine

## 2018-10-03 ENCOUNTER — Ambulatory Visit (INDEPENDENT_AMBULATORY_CARE_PROVIDER_SITE_OTHER): Payer: Medicare HMO | Admitting: Family Medicine

## 2018-10-03 VITALS — BP 116/64 | HR 47 | Temp 97.4°F | Ht 63.5 in | Wt 143.5 lb

## 2018-10-03 DIAGNOSIS — E559 Vitamin D deficiency, unspecified: Secondary | ICD-10-CM | POA: Diagnosis not present

## 2018-10-03 DIAGNOSIS — I10 Essential (primary) hypertension: Secondary | ICD-10-CM

## 2018-10-03 DIAGNOSIS — E119 Type 2 diabetes mellitus without complications: Secondary | ICD-10-CM

## 2018-10-03 DIAGNOSIS — M810 Age-related osteoporosis without current pathological fracture: Secondary | ICD-10-CM

## 2018-10-03 DIAGNOSIS — E7800 Pure hypercholesterolemia, unspecified: Secondary | ICD-10-CM

## 2018-10-03 DIAGNOSIS — E039 Hypothyroidism, unspecified: Secondary | ICD-10-CM | POA: Diagnosis not present

## 2018-10-03 DIAGNOSIS — L304 Erythema intertrigo: Secondary | ICD-10-CM | POA: Insufficient documentation

## 2018-10-03 DIAGNOSIS — E2839 Other primary ovarian failure: Secondary | ICD-10-CM

## 2018-10-03 DIAGNOSIS — Z0000 Encounter for general adult medical examination without abnormal findings: Secondary | ICD-10-CM

## 2018-10-03 MED ORDER — LEVOTHYROXINE SODIUM 100 MCG PO TABS: 100.0000 ug | ORAL_TABLET | Freq: Every day | ORAL | 3 refills | Status: DC

## 2018-10-03 MED ORDER — QUINAPRIL HCL 40 MG PO TABS: ORAL_TABLET | ORAL | 3 refills | Status: DC

## 2018-10-03 MED ORDER — ALENDRONATE SODIUM 70 MG PO TABS: ORAL_TABLET | ORAL | 3 refills | Status: DC

## 2018-10-03 MED ORDER — SERTRALINE HCL 50 MG PO TABS: 50.0000 mg | ORAL_TABLET | Freq: Every day | ORAL | 3 refills | Status: DC

## 2018-10-03 MED ORDER — OMEPRAZOLE 20 MG PO CPDR: DELAYED_RELEASE_CAPSULE | ORAL | 3 refills | Status: DC

## 2018-10-03 MED ORDER — NYSTATIN 100000 UNIT/GM EX POWD: Freq: Two times a day (BID) | CUTANEOUS | 2 refills | Status: DC | PRN

## 2018-10-03 MED ORDER — GEMFIBROZIL 600 MG PO TABS: 600.0000 mg | ORAL_TABLET | Freq: Two times a day (BID) | ORAL | 3 refills | Status: DC

## 2018-10-03 MED ORDER — METFORMIN HCL 500 MG PO TABS: 500.0000 mg | ORAL_TABLET | Freq: Every day | ORAL | 3 refills | Status: DC

## 2018-10-03 MED ORDER — FLUTICASONE PROPIONATE 50 MCG/ACT NA SUSP: NASAL | 3 refills | Status: DC

## 2018-10-03 NOTE — Progress Notes (Signed)
Subjective:    Patient ID: Barbara [name], female    DOB: 1940-12-06, 77 y.o.   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   Wt Readings from Last 3 Encounters:  10/03/18 143 lb 8 oz (65.1 kg)  09/26/18 144 lb 8 oz (65.5 kg)  03/20/18 156 lb 8 oz (71 kg)  eating healthy  Appetite is ok  She wants to mt wt (could not fit in pants in April)  Does avoid sweets  25.02 kg/m   Had amw on 10/23 Missed highest tone both ears on hearing screen  She denies trouble hearing  Mini cog-19/20   (missed one step of 3 step command)- of note pt has hx of cognitive impairment baseline  No problems with memory per pt (misplaces things occ)   Mammogram 3/19 -req [place] that was neg (showed nl LN)- and next mammogram rec at a year from last Self breast exam   Colonoscopy 3/14 with no recall   Eye exam utd this mo  Ok - no new problems  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 - baseline   dexa 10/17 OP in forearm  Fosamax -no problems or side effects , takes it weekly  Falls-none fx-none Ca and D D level is 24.1 (improved from a year ago)  zostavax 5/13  Gyn  Pap 7/14 Hx of vag bleeding in the past  None at all  No pain or other symptoms   Pulse of 47 today  Takes bisoprolol hct  bp is stable today  No cp or palpitations or headaches or edema  No side effects to medicines  BP Readings from Last 3 Encounters:  10/03/18 116/64  09/26/18 112/64  03/20/18 126/82     DM2 Lab Results  Component Value Date   HGBA1C 5.4 09/26/2018  metformin 500 mg once daily  This is stable  Ace for renal protection  No symptoms of neuropathy   Hypothyroidism  Pt has no clinical changes No change in energy level/ hair or skin/ edema and no tremor Lab Results  Component Value Date   TSH 4.00 03/15/2018     Hyperlipidemia Lab Results    Component Value Date   CHOL 198 03/15/2018   HDL 30.70 (L) 03/15/2018   LDLCALC 134 (H) 09/01/2017   LDLDIRECT 115.0 03/15/2018   TRIG 249.0 (H) 03/15/2018   CHOLHDL 6 03/15/2018   gemfibrozil for trig Intol of statins   Patient Active Problem List   Diagnosis Date Noted  . Intertrigo 10/03/2018  . Vitamin D deficiency 03/20/2018  . Rash of face 03/20/2018  . Osteoporosis 09/25/2016  . Encounter for screening mammogram for breast cancer 09/13/2016  . Estrogen deficiency 09/13/2016  . History of shingles 07/27/2016  . Routine general medical examination at a health care facility 07/08/2015  . Vaginal atrophy 07/01/2013  . Encounter for Medicare annual wellness exam 07/01/2013  . Colon cancer screening 06/26/2012  . Hypothyroid 06/18/2012  . Encounter for routine gynecological examination 02/15/2012  . Diabetes type 2, controlled ([place]) 06/05/2007  . HYPERCHOLESTEROLEMIA 06/05/2007  . Generalized anxiety disorder 06/05/2007  . EXPRESSIVE LANGUAGE DISORDER 06/05/2007  . ISCHEMIA, RETINAL 06/05/2007  . Essential hypertension 06/05/2007  . ALLERGIC RHINITIS 06/05/2007   Past Medical History:  Diagnosis Date  . Allergy   . Anemia   . Anxiety   .  Diabetes mellitus   . Expressive language disorder   . GERD (gastroesophageal reflux disease)   . Hyperlipidemia   . Hypertension   . Learning disability   . Mentally disabled   . Obesity   . Retinal ischemia    history of   Past Surgical History:  Procedure Laterality Date  . CATARACT EXTRACTION Left 2014  . CHOLECYSTECTOMY     Social History   Tobacco Use  . Smoking status: Never Smoker  . Smokeless tobacco: Never Used  Substance Use Topics  . Alcohol use: No    Alcohol/week: 0.0 standard drinks  . Drug use: No   Family History  Problem Relation Age of Onset  . Heart disease Father   . Hypertension Father   . Heart disease Brother   . Diabetes Brother    Allergies  Allergen Reactions  . Atorvastatin      REACTION: increased liver fxn tests  . Ezetimibe     REACTION: stomach upset/malaise  . Sulfonamide Derivatives     REACTION: rash   Current Outpatient Medications on File Prior to Visit  Medication Sig Dispense Refill  . acetaminophen (TYLENOL) 500 MG tablet Take 500 mg by mouth every 6 (six) hours as needed.      Marland Kitchen amLODipine (NORVASC) 10 MG tablet Take 1 tablet (10 mg total) by mouth daily. 90 tablet 3  . Calcium Carb-Cholecalciferol (CALCIUM + VITAMIN D3 PO) Take 1 tablet by mouth daily.    . ferrous sulfate 325 (65 FE) MG tablet Take 1 tablet (325 mg total) by mouth daily with breakfast.  3  . fexofenadine (ALLEGRA) 180 MG tablet Take 180 mg by mouth as needed.    . Glucose Blood DISK by In Vitro route.      . vitamin B-12 (CYANOCOBALAMIN) 1000 MCG tablet Take 1 tablet (1,000 mcg total) by mouth daily.     No current facility-administered medications on file prior to visit.     Review of Systems  Constitutional: Negative for activity change, appetite change, fatigue, fever and unexpected weight change.  HENT: Negative for congestion, ear pain, rhinorrhea, sinus pressure and sore throat.   Eyes: Positive for visual disturbance. Negative for pain and redness.  Respiratory: Negative for cough, shortness of breath and wheezing.   Cardiovascular: Negative for chest pain and palpitations.  Gastrointestinal: Negative for abdominal pain, blood in stool, constipation and diarrhea.  Endocrine: Negative for polydipsia and polyuria.  Genitourinary: Negative for dysuria, frequency and urgency.  Musculoskeletal: Negative for arthralgias, back pain and myalgias.  Skin: Negative for pallor and rash.  Allergic/Immunologic: Negative for environmental allergies.  Neurological: Negative for dizziness, syncope and headaches.  Hematological: Negative for adenopathy. Does not bruise/bleed easily.  Psychiatric/Behavioral: Negative for decreased concentration and dysphoric mood. The patient is not  nervous/anxious.        Objective:   Physical Exam  Constitutional: She appears well-developed and well-nourished. No distress.  Well appearing   HENT:  Head: Normocephalic and atraumatic.  Right Ear: External ear normal.  Left Ear: External ear normal.  Mouth/Throat: Oropharynx is clear and moist.  Baseline speech impediment  Eyes: Conjunctivae and EOM are normal. No scleral icterus.  Pupil is white in R eye - w/o vision in that eye   Neck: Normal range of motion. Neck supple. No JVD present. Carotid bruit is not present. No thyromegaly present.  Cardiovascular: Normal rate, regular rhythm, normal heart sounds and intact distal pulses. Exam reveals no gallop.  Pulmonary/Chest: Effort normal and breath  sounds normal. No respiratory distress. She has no wheezes. She has no rales. She exhibits no tenderness. No breast tenderness, discharge or bleeding.  Abdominal: Soft. Bowel sounds are normal. She exhibits no distension, no abdominal bruit and no mass. There is no tenderness.  Genitourinary: No breast tenderness, discharge or bleeding.  Genitourinary Comments: Breast exam: No mass, nodules, thickening, tenderness, bulging, retraction, inflamation, nipple discharge or skin changes noted.  No axillary or clavicular LA.      Musculoskeletal: Normal range of motion. She exhibits no edema or tenderness.  Lymphadenopathy:    She has no cervical adenopathy.  Neurological: She is alert. She has normal reflexes. She displays normal reflexes. No cranial nerve deficit or sensory deficit. She exhibits normal muscle tone. Coordination normal.  Skin: Skin is warm and dry. No rash noted. No erythema. No pallor.  Solar lentigines diffusely   Psychiatric: She has a normal mood and affect.  Baseline cognitive impairment  Pleasant           Assessment & Plan:   Problem List Items Addressed This Visit      Cardiovascular and Mediastinum   Essential hypertension    bp in fair control at this  time  BP Readings from Last 1 Encounters:  10/03/18 116/64  will d/c ziac for pulse in 40s (asymptomatic)  F/u 6 mo Most recent labs reviewed  Disc lifstyle change with low sodium diet and exercise        Relevant Medications   gemfibrozil (LOPID) 600 MG tablet   quinapril (ACCUPRIL) 40 MG tablet     Endocrine   Diabetes type 2, controlled ([place])    Lab Results  Component Value Date   HGBA1C 5.4 09/26/2018   Stable  Metformin and diet  Disc eye and foot care  Ace for renal protection        Relevant Medications   metFORMIN (GLUCOPHAGE) 500 MG tablet   quinapril (ACCUPRIL) 40 MG tablet   Hypothyroid    Hypothyroidism  Pt has no clinical changes No change in energy level/ hair or skin/ edema and no tremor Lab Results  Component Value Date   TSH 4.00 03/15/2018          Relevant Medications   levothyroxine (SYNTHROID, LEVOTHROID) 100 MCG tablet     Musculoskeletal and Integument   Intertrigo    Under breasts  Trial of nystatin cream  Has used lotrisone in the past -may not work as well now       Osteoporosis    dexa 10/17  Ordered 2 y f/u  Tolerating fosamax well  Continue ca and D D level imp but still low -will inc to 4000 iu day      Relevant Medications   alendronate (FOSAMAX) 70 MG tablet     Other   Estrogen deficiency   Relevant Orders   DG Bone Density   HYPERCHOLESTEROLEMIA    Disc goals for lipids and reasons to control them Rev last labs with pt Rev low sat fat diet in detail  Continues gemfibrozil Intol of statins       Relevant Medications   gemfibrozil (LOPID) 600 MG tablet   quinapril (ACCUPRIL) 40 MG tablet   Routine general medical examination at a health care facility - Primary    Reviewed health habits including diet and exercise and skin cancer prevention Reviewed appropriate screening tests for age  Also reviewed health mt list, fam hx and immunization status , as well as social and family history  See HPI Labs rev    amw rev  dexa ordered       Vitamin D deficiency    Level in 20s  Inc daily dose to 4000 iu daily D3 otc  Disc imp to bone and overall health

## 2018-10-03 NOTE — Assessment & Plan Note (Signed)
Lab Results  Component Value Date   HGBA1C 5.4 09/26/2018   Stable  Metformin and diet  Disc eye and foot care  Ace for renal protection

## 2018-10-03 NOTE — Assessment & Plan Note (Addendum)
bp in fair control at this time  BP Readings from Last 1 Encounters:  10/03/18 116/64  will d/c ziac for pulse in 40s (asymptomatic)  F/u 6 mo Most recent labs reviewed  Disc lifstyle change with low sodium diet and exercise

## 2018-10-03 NOTE — Assessment & Plan Note (Signed)
Level in 20s  Inc daily dose to 4000 iu daily D3 otc  Disc imp to bone and overall health

## 2018-10-03 NOTE — Assessment & Plan Note (Signed)
Disc goals for lipids and reasons to control them Rev last labs with pt Rev low sat fat diet in detail  Continues gemfibrozil Intol of statins

## 2018-10-03 NOTE — Assessment & Plan Note (Signed)
dexa 10/17  Ordered 2 y f/u  Tolerating fosamax well  Continue ca and D D level imp but still low -will inc to 4000 iu day

## 2018-10-03 NOTE — Assessment & Plan Note (Signed)
Hypothyroidism  Pt has no clinical changes No change in energy level/ hair or skin/ edema and no tremor Lab Results  Component Value Date   TSH 4.00 03/15/2018

## 2018-10-03 NOTE — Assessment & Plan Note (Signed)
Under breasts  Trial of nystatin cream  Has used lotrisone in the past -may not work as well now

## 2018-10-03 NOTE — Assessment & Plan Note (Signed)
Reviewed health habits including diet and exercise and skin cancer prevention Reviewed appropriate screening tests for age  Also reviewed health mt list, fam hx and immunization status , as well as social and family history   See HPI Labs rev  amw rev  dexa ordered

## 2018-10-03 NOTE — Patient Instructions (Addendum)
Get vitamin D3 over the counter and take 4000 iu daily  Your level is improved but not quite to goal This is important for bones   We will set up your bone density test at check out   Stop the bisoprolol  -hct (ziac)  (that is for blood pressure) I don't think you need it any more   Diabetes is stable and in good control   I'm glad you feel good Keep walking and stay active and eat a healthy diet   Follow up in 6 months please

## 2018-10-29 ENCOUNTER — Ambulatory Visit
Admission: RE | Admit: 2018-10-29 | Discharge: 2018-10-29 | Disposition: A | Discharge status: Home / Self Care | Payer: Medicare HMO | Source: Ambulatory Visit | Attending: Family Medicine | Admitting: Family Medicine

## 2018-10-29 DIAGNOSIS — M85851 Other specified disorders of bone density and structure, right thigh: Secondary | ICD-10-CM | POA: Diagnosis not present

## 2018-10-29 DIAGNOSIS — Z780 Asymptomatic menopausal state: Secondary | ICD-10-CM | POA: Diagnosis not present

## 2018-10-29 DIAGNOSIS — E2839 Other primary ovarian failure: Secondary | ICD-10-CM

## 2018-10-29 DIAGNOSIS — M810 Age-related osteoporosis without current pathological fracture: Secondary | ICD-10-CM | POA: Diagnosis not present

## 2018-10-30 ENCOUNTER — Encounter: Payer: Self-pay | Admitting: *Deleted

## 2018-11-06 ENCOUNTER — Other Ambulatory Visit: Payer: Self-pay | Admitting: *Deleted

## 2018-11-06 MED ORDER — AMLODIPINE BESYLATE 10 MG PO TABS: 10.0000 mg | ORAL_TABLET | Freq: Every day | ORAL | 3 refills | Status: DC

## 2019-04-01 ENCOUNTER — Other Ambulatory Visit: Payer: Self-pay

## 2019-04-01 ENCOUNTER — Other Ambulatory Visit (INDEPENDENT_AMBULATORY_CARE_PROVIDER_SITE_OTHER): Payer: Medicare HMO

## 2019-04-01 DIAGNOSIS — E119 Type 2 diabetes mellitus without complications: Secondary | ICD-10-CM

## 2019-04-01 DIAGNOSIS — I10 Essential (primary) hypertension: Secondary | ICD-10-CM | POA: Diagnosis not present

## 2019-04-01 DIAGNOSIS — E7800 Pure hypercholesterolemia, unspecified: Secondary | ICD-10-CM

## 2019-04-01 LAB — CBC WITH DIFFERENTIAL/PLATELET
Basophils Absolute: 0.1 10*3/uL (ref 0.0–0.1)
Basophils Relative: 1.1 % (ref 0.0–3.0)
Eosinophils Absolute: 0.2 10*3/uL (ref 0.0–0.7)
Eosinophils Relative: 2.7 % (ref 0.0–5.0)
HCT: 35.8 % — ABNORMAL LOW (ref 36.0–46.0)
Hemoglobin: 12.2 g/dL (ref 12.0–15.0)
Lymphocytes Relative: 23.3 % (ref 12.0–46.0)
Lymphs Abs: 1.6 10*3/uL (ref 0.7–4.0)
MCHC: 34.1 g/dL (ref 30.0–36.0)
MCV: 89.2 fl (ref 78.0–100.0)
Monocytes Absolute: 0.7 10*3/uL (ref 0.1–1.0)
Monocytes Relative: 10.4 % (ref 3.0–12.0)
Neutro Abs: 4.2 10*3/uL (ref 1.4–7.7)
Neutrophils Relative %: 62.5 % (ref 43.0–77.0)
Platelets: 272 10*3/uL (ref 150.0–400.0)
RBC: 4.02 Mil/uL (ref 3.87–5.11)
RDW: 13 % (ref 11.5–15.5)
WBC: 6.7 10*3/uL (ref 4.0–10.5)

## 2019-04-01 LAB — COMPREHENSIVE METABOLIC PANEL
ALT: 10 U/L (ref 0–35)
AST: 18 U/L (ref 0–37)
Albumin: 4.8 g/dL (ref 3.5–5.2)
Alkaline Phosphatase: 50 U/L (ref 39–117)
BUN: 22 mg/dL (ref 6–23)
CO2: 25 mEq/L (ref 19–32)
Calcium: 9.3 mg/dL (ref 8.4–10.5)
Chloride: 103 mEq/L (ref 96–112)
Creatinine, Ser: 0.87 mg/dL (ref 0.40–1.20)
GFR: 63.11 mL/min (ref >60.00)
Glucose, Bld: 118 mg/dL — ABNORMAL HIGH (ref 70–99)
Potassium: 4.4 mEq/L (ref 3.5–5.1)
Sodium: 140 mEq/L (ref 135–145)
Total Bilirubin: 0.3 mg/dL (ref 0.2–1.2)
Total Protein: 7.6 g/dL (ref 6.0–8.3)

## 2019-04-01 LAB — LDL CHOLESTEROL, DIRECT: Direct LDL: 123 mg/dL

## 2019-04-01 LAB — LIPID PANEL
Cholesterol: 215 mg/dL — ABNORMAL HIGH (ref 0–200)
HDL: 33.1 mg/dL — ABNORMAL LOW (ref >39.00)
NonHDL: 182.1
Total CHOL/HDL Ratio: 7
Triglycerides: 234 mg/dL — ABNORMAL HIGH (ref 0.0–149.0)
VLDL: 46.8 mg/dL — ABNORMAL HIGH (ref 0.0–40.0)

## 2019-04-01 LAB — HEMOGLOBIN A1C: Hgb A1c MFr Bld: 5.4 % (ref 4.6–6.5)

## 2019-04-05 ENCOUNTER — Ambulatory Visit (INDEPENDENT_AMBULATORY_CARE_PROVIDER_SITE_OTHER): Payer: Medicare HMO | Admitting: Family Medicine

## 2019-04-05 ENCOUNTER — Encounter: Payer: Self-pay | Admitting: Family Medicine

## 2019-04-05 DIAGNOSIS — I10 Essential (primary) hypertension: Secondary | ICD-10-CM

## 2019-04-05 DIAGNOSIS — E7800 Pure hypercholesterolemia, unspecified: Secondary | ICD-10-CM

## 2019-04-05 DIAGNOSIS — E119 Type 2 diabetes mellitus without complications: Secondary | ICD-10-CM

## 2019-04-05 NOTE — Assessment & Plan Note (Signed)
Stable and very well controlled with metformin and diet  Lab Results  Component Value Date   HGBA1C 5.4 04/01/2019   Reassuring Good foot care  On ace for renal protection Intol of statin unfortunately F/u 6 mo

## 2019-04-05 NOTE — Assessment & Plan Note (Signed)
Disc goals for lipids and reasons to control them Rev last labs with pt Rev low sat fat diet in detail LDL down a bit but not at goal  Diet is good  Intol of statins  Gemfibrozil controls triglycerides (stable in 200s) Enc good diet Enc exercise for HDL (which is low)

## 2019-04-05 NOTE — Progress Notes (Signed)
Virtual Visit via Telephone Note  I connected with Barbara [name] on 04/05/19 at 10:15 AM EDT by telephone and verified that I am speaking with the correct person using two identifiers.  Location: Patient: at home Provider: at my office at [facility]   She is quarantined at home  Getting outdoors    I discussed the limitations, risks, security and privacy concerns of performing an evaluation and management service by telephone and the availability of in person appointments. I also discussed with the patient that there may be a patient responsible charge related to this service. The patient expressed understanding and agreed to proceed.   History of Present Illness: Pt presents for 6 mo f/u of chronic conditions  Feeling good   When she goes outside - throat gets sore -worse when wind No swelling , no redness   Weight at home -does not check  Wt Readings from Last 3 Encounters:  10/03/18 143 lb 8 oz (65.1 kg)  09/26/18 144 lb 8 oz (65.5 kg)  03/20/18 156 lb 8 oz (71 kg)  she thinks she may have lost a few lb Does not eat as much as she used to  Gets full faster   bp was stable at last visit-eoes not check at home   No cp or palpitations or headaches or edema  No side effects to medicines  BP Readings from Last 3 Encounters:  10/03/18 116/64  09/26/18 112/64  03/20/18 126/82    Lab Results  Component Value Date   CREATININE 0.87 04/01/2019   BUN 22 04/01/2019   NA 140 04/01/2019   K 4.4 04/01/2019   CL 103 04/01/2019   CO2 25 04/01/2019   Lab Results  Component Value Date   ALT 10 04/01/2019   AST 18 04/01/2019   ALKPHOS 50 04/01/2019   BILITOT 0.3 04/01/2019   Lab Results  Component Value Date   WBC 6.7 04/01/2019   HGB 12.2 04/01/2019   HCT 35.8 (L) 04/01/2019   MCV 89.2 04/01/2019   PLT 272.0 04/01/2019       DM2 Lab Results  Component Value Date   HGBA1C 5.4 04/01/2019   This is unchanged from last time  Metformin and diet  No side  effects at all  Does not check glucose  No symptoms of hypoglycemia  Eats healthy  She gets out and mows yard and walks  Foot care -very good  Eye exam 10/19 (ischemic eye damage) -goes once per year  Is blind in her R eye   Hyperlipidemia  Lab Results  Component Value Date   CHOL 215 (H) 04/01/2019   CHOL 198 03/15/2018   CHOL 207 (H) 09/01/2017   Lab Results  Component Value Date   HDL 33.10 (L) 04/01/2019   HDL 30.70 (L) 03/15/2018   HDL 33.50 (L) 09/01/2017   Lab Results  Component Value Date   LDLCALC 134 (H) 09/01/2017   LDLCALC 118 (H) 06/24/2014   Lab Results  Component Value Date   TRIG 234.0 (H) 04/01/2019   TRIG 249.0 (H) 03/15/2018   TRIG 197.0 (H) 09/01/2017   Lab Results  Component Value Date   CHOLHDL 7 04/01/2019   CHOLHDL 6 03/15/2018   CHOLHDL 6 09/01/2017   Lab Results  Component Value Date   LDLDIRECT 123.0 04/01/2019   LDLDIRECT 115.0 03/15/2018   LDLDIRECT 113.0 07/27/2016   Takes gemfibrozil for triglyderides (which are improved) Intolerant of statins  LDL is 123  Diet - occ  fried foods / does not eat much red meat  Some butter   Anxiety is well controlled  Being careful with pandemic - and not worried   Review of Systems  Constitutional: Negative for chills, fever and malaise/fatigue.  HENT: Positive for sore throat. Negative for congestion, ear pain, sinus pain and tinnitus.   Eyes: Negative for discharge and redness.       Baseline no vision in R eye  Respiratory: Negative for cough, shortness of breath, wheezing and stridor.   Cardiovascular: Negative for chest pain, palpitations, claudication and leg swelling.  Gastrointestinal: Negative for heartburn, nausea and vomiting.  Genitourinary: Negative for frequency and urgency.  Musculoskeletal: Negative for myalgias.  Skin: Negative for rash.  Neurological: Negative for dizziness and headaches.  Endo/Heme/Allergies: Negative for polydipsia.  Psychiatric/Behavioral: Negative  for depression. The patient is not nervous/anxious.       Patient Active Problem List   Diagnosis Date Noted  . Intertrigo 10/03/2018  . Vitamin D deficiency 03/20/2018  . Osteoporosis 09/25/2016  . Encounter for screening mammogram for breast cancer 09/13/2016  . Estrogen deficiency 09/13/2016  . History of shingles 07/27/2016  . Routine general medical examination at a health care facility 07/08/2015  . Vaginal atrophy 07/01/2013  . Encounter for Medicare annual wellness exam 07/01/2013  . Colon cancer screening 06/26/2012  . Hypothyroid 06/18/2012  . Encounter for routine gynecological examination 02/15/2012  . Controlled type 2 diabetes mellitus without complication, without long-term current use of insulin ([facility]) 06/05/2007  . HYPERCHOLESTEROLEMIA 06/05/2007  . Generalized anxiety disorder 06/05/2007  . EXPRESSIVE LANGUAGE DISORDER 06/05/2007  . ISCHEMIA, RETINAL 06/05/2007  . Essential hypertension 06/05/2007  . ALLERGIC RHINITIS 06/05/2007   Past Medical History:  Diagnosis Date  . Allergy   . Anemia   . Anxiety   . Diabetes mellitus   . Expressive language disorder   . GERD (gastroesophageal reflux disease)   . Hyperlipidemia   . Hypertension   . Learning disability   . Mentally disabled   . Obesity   . Retinal ischemia    history of   Past Surgical History:  Procedure Laterality Date  . CATARACT EXTRACTION Left 2014  . CHOLECYSTECTOMY     Social History   Tobacco Use  . Smoking status: Never Smoker  . Smokeless tobacco: Never Used  Substance Use Topics  . Alcohol use: No    Alcohol/week: 0.0 standard drinks  . Drug use: No   Family History  Problem Relation Age of Onset  . Heart disease Father   . Hypertension Father   . Heart disease Brother   . Diabetes Brother    Allergies  Allergen Reactions  . Atorvastatin     REACTION: increased liver fxn tests  . Ezetimibe     REACTION: stomach upset/malaise  . Sulfonamide Derivatives     REACTION:  rash   Current Outpatient Medications on File Prior to Visit  Medication Sig Dispense Refill  . acetaminophen (TYLENOL) 500 MG tablet Take 500 mg by mouth every 6 (six) hours as needed.      Marland Kitchen alendronate (FOSAMAX) 70 MG tablet TAKE 1 TABLET BY MOUTH 30 MINUTES BEFOREBREAKFAST ONCE A WEEK WITH ATLEAST 8 OZ. OF WATER. 12 tablet 3  . amLODipine (NORVASC) 10 MG tablet Take 1 tablet (10 mg total) by mouth daily. 90 tablet 3  . Calcium Carb-Cholecalciferol (CALCIUM + VITAMIN D3 PO) Take 1 tablet by mouth daily.    . ferrous sulfate 325 (65 FE) MG tablet Take 1  tablet (325 mg total) by mouth daily with breakfast.  3  . fexofenadine (ALLEGRA) 180 MG tablet Take 180 mg by mouth as needed.    . fluticasone (FLONASE) 50 MCG/ACT nasal spray USE TWO SPRAYS IN EACH NOSTRIL DAILY 48 g 3  . gemfibrozil (LOPID) 600 MG tablet Take 1 tablet (600 mg total) by mouth 2 (two) times daily. 180 tablet 3  . Glucose Blood DISK by In Vitro route.      Marland Kitchen levothyroxine (SYNTHROID, LEVOTHROID) 100 MCG tablet Take 1 tablet (100 mcg total) by mouth daily. 90 tablet 3  . metFORMIN (GLUCOPHAGE) 500 MG tablet Take 1 tablet (500 mg total) by mouth daily with breakfast. 90 tablet 3  . nystatin (MYCOSTATIN/NYSTOP) powder Apply topically 2 (two) times daily as needed. To itchy rash under breasts 30 g 2  . omeprazole (PRILOSEC) 20 MG capsule TAKE ONE CAPSULE BY MOUTH DAILY BEFORE BREAKFAST 90 capsule 3  . quinapril (ACCUPRIL) 40 MG tablet TAKE ONE-HALF (0.5) BY MOUTH DAILY 45 tablet 3  . sertraline (ZOLOFT) 50 MG tablet Take 1 tablet (50 mg total) by mouth daily. 90 tablet 3  . vitamin B-12 (CYANOCOBALAMIN) 1000 MCG tablet Take 1 tablet (1,000 mcg total) by mouth daily.     No current facility-administered medications on file prior to visit.     Observations/Objective: Pt sounds like her normal self  Baseline speech disorder /no changes  Affect is normal/mood is good  Pleasant  Answers questions appropriately  Not hoarse  No  slurring  No sob/wheezing with speech   Assessment and Plan: Problem List Items Addressed This Visit      Cardiovascular and Mediastinum   Essential hypertension    bp has been well controlled but pt does not have cuff to check at home No symptoms of high or low bp  Continues amlodipine and ace BP Readings from Last 1 Encounters:  10/03/18 116/64   No changes needed Most recent labs reviewed  Disc lifstyle change with low sodium diet and exercise          Endocrine   Controlled type 2 diabetes mellitus without complication, without long-term current use of insulin ([facility]) - Primary    Stable and very well controlled with metformin and diet  Lab Results  Component Value Date   HGBA1C 5.4 04/01/2019   Reassuring Good foot care  On ace for renal protection Intol of statin unfortunately F/u 6 mo        Other   HYPERCHOLESTEROLEMIA    Disc goals for lipids and reasons to control them Rev last labs with pt Rev low sat fat diet in detail LDL down a bit but not at goal  Diet is good  Intol of statins  Gemfibrozil controls triglycerides (stable in 200s) Enc good diet Enc exercise for HDL (which is low)          Follow Up Instructions: Your labs look fairly stable  Keep track of your weight at home if you can  Get outside when pollen is not too bad- make sure to wear sunscreen  Wear a mask if you have to go out  Continue to quarantine for the coronavirus pandemic  Continue a healthy diet (less fried foods)  Don't skip meals  Try to get more exercise (this will help your HDL /good cholesterol See you in November or earlier if needed    I discussed the assessment and treatment plan with the patient. The patient was provided an opportunity to ask  questions and all were answered. The patient agreed with the plan and demonstrated an understanding of the instructions.   The patient was advised to call back or seek an in-person evaluation if the symptoms worsen or if the  condition fails to improve as anticipated.  I provided 18 minutes of non-face-to-face time during this encounter.   Loura Pardon, MD

## 2019-04-05 NOTE — Patient Instructions (Signed)
Your labs look fairly stable  Keep track of your weight at home if you can  Get outside when pollen is not too bad- make sure to wear sunscreen  Wear a mask if you have to go out  Continue to quarantine for the coronavirus pandemic  Continue a healthy diet (less fried foods)  Don't skip meals  Try to get more exercise (this will help your HDL /good cholesterol See you in November or earlier if needed

## 2019-04-05 NOTE — Assessment & Plan Note (Addendum)
bp has been well controlled but pt does not have cuff to check at home No symptoms of high or low bp  Continues amlodipine and ace BP Readings from Last 1 Encounters:  10/03/18 116/64   No changes needed Most recent labs reviewed  Disc lifstyle change with low sodium diet and exercise

## 2019-06-18 ENCOUNTER — Other Ambulatory Visit: Payer: Self-pay | Admitting: *Deleted

## 2019-06-18 MED ORDER — ALENDRONATE SODIUM 70 MG PO TABS: ORAL_TABLET | ORAL | 1 refills | Status: DC

## 2019-08-31 DIAGNOSIS — R69 Illness, unspecified: Secondary | ICD-10-CM | POA: Diagnosis not present

## 2019-09-18 ENCOUNTER — Other Ambulatory Visit: Payer: Self-pay | Admitting: *Deleted

## 2019-09-18 MED ORDER — METFORMIN HCL 500 MG PO TABS: 500.0000 mg | ORAL_TABLET | Freq: Every day | ORAL | 0 refills | Status: DC

## 2019-09-23 ENCOUNTER — Other Ambulatory Visit: Payer: Self-pay | Admitting: *Deleted

## 2019-09-23 MED ORDER — LEVOTHYROXINE SODIUM 100 MCG PO TABS: 100.0000 ug | ORAL_TABLET | Freq: Every day | ORAL | 0 refills | Status: DC

## 2019-10-01 ENCOUNTER — Other Ambulatory Visit: Payer: Self-pay | Admitting: *Deleted

## 2019-10-01 MED ORDER — OMEPRAZOLE 20 MG PO CPDR: DELAYED_RELEASE_CAPSULE | ORAL | 0 refills | Status: DC

## 2019-10-01 MED ORDER — QUINAPRIL HCL 40 MG PO TABS: ORAL_TABLET | ORAL | 0 refills | Status: DC

## 2019-10-02 ENCOUNTER — Ambulatory Visit: Payer: Medicare HMO

## 2019-10-07 ENCOUNTER — Telehealth: Payer: Self-pay | Admitting: Family Medicine

## 2019-10-07 ENCOUNTER — Ambulatory Visit (INDEPENDENT_AMBULATORY_CARE_PROVIDER_SITE_OTHER): Payer: Medicare HMO

## 2019-10-07 DIAGNOSIS — E1169 Type 2 diabetes mellitus with other specified complication: Secondary | ICD-10-CM

## 2019-10-07 DIAGNOSIS — E785 Hyperlipidemia, unspecified: Secondary | ICD-10-CM

## 2019-10-07 DIAGNOSIS — E559 Vitamin D deficiency, unspecified: Secondary | ICD-10-CM

## 2019-10-07 DIAGNOSIS — E039 Hypothyroidism, unspecified: Secondary | ICD-10-CM

## 2019-10-07 DIAGNOSIS — Z0000 Encounter for general adult medical examination without abnormal findings: Secondary | ICD-10-CM | POA: Diagnosis not present

## 2019-10-07 DIAGNOSIS — I10 Essential (primary) hypertension: Secondary | ICD-10-CM

## 2019-10-07 DIAGNOSIS — M810 Age-related osteoporosis without current pathological fracture: Secondary | ICD-10-CM

## 2019-10-07 DIAGNOSIS — E119 Type 2 diabetes mellitus without complications: Secondary | ICD-10-CM

## 2019-10-07 NOTE — Progress Notes (Signed)
PCP notes:  Health Maintenance: Declined Tdap, Declined Shingrix, Declined Mammogram  Eye exam scheduled for 12/12/2018    Abnormal Screenings: MMSE score 15    Patient concerns: none    Nurse concerns: none    Next PCP appt.: 10/10/2019 @ 10:30 am

## 2019-10-07 NOTE — Telephone Encounter (Signed)
-----   Message from Ellamae Sia sent at 09/30/2019 12:39 PM EDT ----- Regarding: lab orders for Tuesday, 11.3.20 Patient is scheduled for CPX labs, please order future labs, Thanks , Karna Christmas

## 2019-10-07 NOTE — Patient Instructions (Signed)
Ms. Barbara [name] , Thank you for taking time to come for your Medicare Wellness Visit. I appreciate your ongoing commitment to your health goals. Please review the following plan we discussed and let me know if I can assist you in the future.   Screening recommendations/referrals: Colonoscopy: up to date, completed 02/07/2013 Mammogram: declined due to COVID Bone Density: up to date, completed 10/29/2018 Recommended yearly ophthalmology/optometry visit for glaucoma screening and checkup Recommended yearly dental visit for hygiene and checkup  Vaccinations: Influenza vaccine: up to date, completed 08/31/2019 Pneumococcal vaccine: Completed series Tdap vaccine: declined Shingles vaccine: declined    Advanced directives: Advance directive discussed with you today. Even though you declined this today please call our office should you change your mind and we can give you the proper paperwork for you to fill out.  Conditions/risks identified: diabetes, hypertension, hyperlipidemia  Next appointment: 10/10/2019 @ 10:30 am    Preventive Care 65 Years and Older, Female Preventive care refers to lifestyle choices and visits with your health care provider that can promote health and wellness. What does preventive care include?  A yearly physical exam. This is also called an annual well check.  Dental exams once or twice a year.  Routine eye exams. Ask your health care provider how often you should have your eyes checked.  Personal lifestyle choices, including:  Daily care of your teeth and gums.  Regular physical activity.  Eating a healthy diet.  Avoiding tobacco and drug use.  Limiting alcohol use.  Practicing safe sex.  Taking low-dose aspirin every day.  Taking vitamin and mineral supplements as recommended by your health care provider. What happens during an annual well check? The services and screenings done by your health care provider during your annual well check will depend on  your age, overall health, lifestyle risk factors, and family history of disease. Counseling  Your health care provider may ask you questions about your:  Alcohol use.  Tobacco use.  Drug use.  Emotional well-being.  Home and relationship well-being.  Sexual activity.  Eating habits.  History of falls.  Memory and ability to understand (cognition).  Work and work Statistician.  Reproductive health. Screening  You may have the following tests or measurements:  Height, weight, and BMI.  Blood pressure.  Lipid and cholesterol levels. These may be checked every 5 years, or more frequently if you are over 3 years old.  Skin check.  Lung cancer screening. You may have this screening every year starting at age 25 if you have a 30-pack-year history of smoking and currently smoke or have quit within the past 15 years.  Fecal occult blood test (FOBT) of the stool. You may have this test every year starting at age 26.  Flexible sigmoidoscopy or colonoscopy. You may have a sigmoidoscopy every 5 years or a colonoscopy every 10 years starting at age 58.  Hepatitis C blood test.  Hepatitis B blood test.  Sexually transmitted disease (STD) testing.  Diabetes screening. This is done by checking your blood sugar (glucose) after you have not eaten for a while (fasting). You may have this done every 1-3 years.  Bone density scan. This is done to screen for osteoporosis. You may have this done starting at age 55.  Mammogram. This may be done every 1-2 years. Talk to your health care provider about how often you should have regular mammograms. Talk with your health care provider about your test results, treatment options, and if necessary, the need for more tests.  Vaccines  Your health care provider may recommend certain vaccines, such as:  Influenza vaccine. This is recommended every year.  Tetanus, diphtheria, and acellular pertussis (Tdap, Td) vaccine. You may need a Td booster  every 10 years.  Zoster vaccine. You may need this after age 45.  Pneumococcal 13-valent conjugate (PCV13) vaccine. One dose is recommended after age 84.  Pneumococcal polysaccharide (PPSV23) vaccine. One dose is recommended after age 36. Talk to your health care provider about which screenings and vaccines you need and how often you need them. This information is not intended to replace advice given to you by your health care provider. Make sure you discuss any questions you have with your health care provider. Document Released: 12/18/2015 Document Revised: 08/10/2016 Document Reviewed: 09/22/2015 Elsevier Interactive Patient Education  2017 [employer] [employer] Prevention in the Home Falls can cause injuries. They can happen to people of all ages. There are many things you can do to make your home safe and to help prevent falls. What can I do on the outside of my home?  Regularly fix the edges of walkways and driveways and fix any cracks.  Remove anything that might make you trip as you walk through a door, such as a raised step or threshold.  Trim any bushes or trees on the path to your home.  Use bright outdoor lighting.  Clear any walking paths of anything that might make someone trip, such as rocks or tools.  Regularly check to see if handrails are loose or broken. Make sure that both sides of any steps have handrails.  Any raised decks and porches should have guardrails on the edges.  Have any leaves, snow, or ice cleared regularly.  Use sand or salt on walking paths during winter.  Clean up any spills in your garage right away. This includes oil or grease spills. What can I do in the bathroom?  Use night lights.  Install grab bars by the toilet and in the tub and shower. Do not use towel bars as grab bars.  Use non-skid mats or decals in the tub or shower.  If you need to sit down in the shower, use a plastic, non-slip stool.  Keep the floor dry. Clean up any  water that spills on the floor as soon as it happens.  Remove soap buildup in the tub or shower regularly.  Attach bath mats securely with double-sided non-slip rug tape.  Do not have throw rugs and other things on the floor that can make you trip. What can I do in the bedroom?  Use night lights.  Make sure that you have a light by your bed that is easy to reach.  Do not use any sheets or blankets that are too big for your bed. They should not hang down onto the floor.  Have a firm chair that has side arms. You can use this for support while you get dressed.  Do not have throw rugs and other things on the floor that can make you trip. What can I do in the kitchen?  Clean up any spills right away.  Avoid walking on wet floors.  Keep items that you use a lot in easy-to-reach places.  If you need to reach something above you, use a strong step stool that has a grab bar.  Keep electrical cords out of the way.  Do not use floor polish or wax that makes floors slippery. If you must use wax, use non-skid floor wax.  Do not have throw rugs and other things on the floor that can make you trip. What can I do with my stairs?  Do not leave any items on the stairs.  Make sure that there are handrails on both sides of the stairs and use them. Fix handrails that are broken or loose. Make sure that handrails are as long as the stairways.  Check any carpeting to make sure that it is firmly attached to the stairs. Fix any carpet that is loose or worn.  Avoid having throw rugs at the top or bottom of the stairs. If you do have throw rugs, attach them to the floor with carpet tape.  Make sure that you have a light switch at the top of the stairs and the bottom of the stairs. If you do not have them, ask someone to add them for you. What else can I do to help prevent falls?  Wear shoes that:  Do not have high heels.  Have rubber bottoms.  Are comfortable and fit you well.  Are closed  at the toe. Do not wear sandals.  If you use a stepladder:  Make sure that it is fully opened. Do not climb a closed stepladder.  Make sure that both sides of the stepladder are locked into place.  Ask someone to hold it for you, if possible.  Clearly mark and make sure that you can see:  Any grab bars or handrails.  First and last steps.  Where the edge of each step is.  Use tools that help you move around (mobility aids) if they are needed. These include:  Canes.  Walkers.  Scooters.  Crutches.  Turn on the lights when you go into a dark area. Replace any light bulbs as soon as they burn out.  Set up your furniture so you have a clear path. Avoid moving your furniture around.  If any of your floors are uneven, fix them.  If there are any pets around you, be aware of where they are.  Review your medicines with your doctor. Some medicines can make you feel dizzy. This can increase your chance of falling. Ask your doctor what other things that you can do to help prevent falls. This information is not intended to replace advice given to you by your health care provider. Make sure you discuss any questions you have with your health care provider. Document Released: 09/17/2009 Document Revised: 04/28/2016 Document Reviewed: 12/26/2014 Elsevier Interactive Patient Education  2017 [employer].

## 2019-10-07 NOTE — Progress Notes (Signed)
Subjective:   Barbara [name] is a 78 y.o. female who presents for Medicare Annual (Subsequent) preventive examination.  Review of Systems:    This visit is being conducted through telemedicine via telephone at the nurse health advisor's home address due to the COVID-19 pandemic. This patient has given me verbal consent via doximity to conduct this visit, patient states they are participating from their home address. Patient and myself are on the telephone call. There is no referral for this visit. Some vital signs may be absent or patient reported.    Patient identification: identified by name, DOB, and current address  Cardiac Risk Factors include: advanced age (>35men, >56 women);diabetes mellitus;dyslipidemia;hypertension     Objective:     Vitals: There were no vitals taken for this visit.  There is no height or weight on file to calculate BMI.  Advanced Directives 10/07/2019 09/26/2018 09/01/2017 08/30/2016  Does Patient Have a Medical Advance Directive? No No Yes Yes  Type of Advance Directive - Public librarian;Living will [place];Living will  Does patient want to make changes to medical advance directive? - - - No - Patient declined  Copy of [place] in Chart? - - No - copy requested No - copy requested  Would patient like information on creating a medical advance directive? No - Patient declined No - Patient declined - -    Tobacco Social History   Tobacco Use  Smoking Status Never Smoker  Smokeless Tobacco Never Used     Counseling given: Not Answered   Clinical Intake:  Pre-visit preparation completed: Yes  Pain : No/denies pain     Nutritional Risks: None Diabetes: Yes CBG done?: No Did pt. bring in CBG monitor from home?: No  How often do you need to have someone help you when you read instructions, pamphlets, or other written materials from your doctor or pharmacy?: 1 - Never What is the last  grade level you completed in school?: 9th  Interpreter Needed?: No  Information entered by :: CJohnson, LPN  Past Medical History:  Diagnosis Date  . Allergy   . Anemia   . Anxiety   . Diabetes mellitus   . Expressive language disorder   . GERD (gastroesophageal reflux disease)   . Hyperlipidemia   . Hypertension   . Learning disability   . Mentally disabled   . Obesity   . Retinal ischemia    history of   Past Surgical History:  Procedure Laterality Date  . CATARACT EXTRACTION Left 2014  . CHOLECYSTECTOMY     Family History  Problem Relation Age of Onset  . Heart disease Father   . Hypertension Father   . Heart disease Brother   . Diabetes Brother    Social History   Socioeconomic History  . Marital status: Divorced    Spouse name: Not on file  . Number of children: 0  . Years of education: 9th grade  . Highest education level: Not on file  Occupational History  . Occupation: disabled    Fish farm manager: Not Employed  . Occupation: Disabled    Fish farm manager: Not Employed  Social Needs  . Financial resource strain: Not hard at all  . Food insecurity    Worry: Never true    Inability: Never true  . Transportation needs    Medical: No    Non-medical: No  Tobacco Use  . Smoking status: Never Smoker  . Smokeless tobacco: Never Used  Substance and  Sexual Activity  . Alcohol use: No    Alcohol/week: 0.0 standard drinks  . Drug use: No  . Sexual activity: Never  Lifestyle  . Physical activity    Days per week: 0 days    Minutes per session: 0 min  . Stress: Not at all  Relationships  . Social Herbalist on phone: Not on file    Gets together: Not on file    Attends religious service: Not on file    Active member of club or organization: Not on file    Attends meetings of clubs or organizations: Not on file    Relationship status: Not on file  Other Topics Concern  . Not on file  Social History Narrative   Ex-Husband has pschizoaffective disorder,  family close by/ lot of support, works in garden   Disabled - mental retardation   Sister Arty Baumgartner is guardian    Outpatient Encounter Medications as of 10/07/2019  Medication Sig  . acetaminophen (TYLENOL) 500 MG tablet Take 500 mg by mouth every 6 (six) hours as needed.    Marland Kitchen alendronate (FOSAMAX) 70 MG tablet TAKE 1 TABLET BY MOUTH 30 MINUTES BEFOREBREAKFAST ONCE A WEEK WITH ATLEAST 8 OZ. OF WATER.  Marland Kitchen amLODipine (NORVASC) 10 MG tablet Take 1 tablet (10 mg total) by mouth daily.  . Calcium Carb-Cholecalciferol (CALCIUM + VITAMIN D3 PO) Take 1 tablet by mouth daily.  . ferrous sulfate 325 (65 FE) MG tablet Take 1 tablet (325 mg total) by mouth daily with breakfast.  . fexofenadine (ALLEGRA) 180 MG tablet Take 180 mg by mouth as needed.  . fluticasone (FLONASE) 50 MCG/ACT nasal spray USE TWO SPRAYS IN EACH NOSTRIL DAILY  . gemfibrozil (LOPID) 600 MG tablet Take 1 tablet (600 mg total) by mouth 2 (two) times daily.  . Glucose Blood DISK by In Vitro route.    Marland Kitchen levothyroxine (SYNTHROID) 100 MCG tablet Take 1 tablet (100 mcg total) by mouth daily before breakfast.  . metFORMIN (GLUCOPHAGE) 500 MG tablet Take 1 tablet (500 mg total) by mouth daily with breakfast.  . nystatin (MYCOSTATIN/NYSTOP) powder Apply topically 2 (two) times daily as needed. To itchy rash under breasts  . omeprazole (PRILOSEC) 20 MG capsule TAKE ONE CAPSULE BY MOUTH DAILY BEFORE BREAKFAST  . quinapril (ACCUPRIL) 40 MG tablet TAKE ONE-HALF (0.5) BY MOUTH DAILY  . sertraline (ZOLOFT) 50 MG tablet Take 1 tablet (50 mg total) by mouth daily.  . vitamin B-12 (CYANOCOBALAMIN) 1000 MCG tablet Take 1 tablet (1,000 mcg total) by mouth daily.   No facility-administered encounter medications on file as of 10/07/2019.     Activities of Daily Living In your present state of health, do you have any difficulty performing the following activities: 10/07/2019  Hearing? N  Vision? Y  Comment blind in her right eye  Difficulty  concentrating or making decisions? Y  Comment forgets dates  Walking or climbing stairs? N  Dressing or bathing? N  Doing errands, shopping? N  Preparing Food and eating ? N  Using the Toilet? N  In the past six months, have you accidently leaked urine? N  Do you have problems with loss of bowel control? N  Managing your Medications? N  Managing your Finances? N  Housekeeping or managing your Housekeeping? N  Some recent data might be hidden    Patient Care Team: Tower, Wynelle Fanny, MD as PCP - General Druscilla Brownie, MD as Consulting Physician (Dermatology)    Assessment:  This is a routine wellness examination for Barbara [name].  Exercise Activities and Dietary recommendations Current Exercise Habits: Home exercise routine, Type of exercise: walking, Time (Minutes): 15, Frequency (Times/Week): 7, Weekly Exercise (Minutes/Week): 105, Intensity: Mild, Exercise limited by: None identified  Goals    . Increase water intake     Starting 09/26/2018, I will attempt to drink at least 6-8 glasses of water daily.     . Patient Stated     10/07/2019, I will maintain walking daily and continue medications as prescribed.       Fall Risk Fall Risk  10/07/2019 09/26/2018 09/01/2017 08/30/2016 07/08/2015  Falls in the past year? 0 No Yes Yes Yes  Comment - - pt stated she tripped over her own feet - -  Number falls in past yr: 0 - 2 or more 1 1  Injury with Fall? 0 - No No No  Risk for fall due to : Medication side effect;Impaired balance/gait - - - -  Follow up Falls evaluation completed;Falls prevention discussed - - Falls evaluation completed -   Is the patient's home free of loose throw rugs in walkways, pet beds, electrical cords, etc?   yes      Grab bars in the bathroom? no      Handrails on the stairs?   yes      Adequate lighting?   yes  Timed Get Up and Go performed: N/A  Depression Screen PHQ 2/9 Scores 10/07/2019 09/26/2018 09/01/2017 08/30/2016  PHQ - 2 Score 0 0 0 0  PHQ- 9  Score 0 0 0 -     Cognitive Function MMSE - Mini Mental State Exam 10/07/2019 09/26/2018 09/01/2017 08/30/2016  Orientation to time 5 5 5 3   Orientation to time comments - - - pt was unable to provide correct year despite multiple cues  Orientation to Place 5 5 5 5   Registration 3 3 3 3   Attention/ Calculation 0 0 0 0  Recall 2 3 3 3   Language- name 2 objects - 0 0 0  Language- repeat 0 1 1 1   Language- follow 3 step command - 2 3 3   Language- follow 3 step command-comments - unable to follow 1 step of 3 step command - -  Language- read & follow direction - 0 0 0  Write a sentence - 0 0 0  Copy design - 0 0 0  Total score - 19 20 18   Mini Cog  Mini-Cog screen was completed. Maximum score is 22. A value of 0 denotes this part of the MMSE was not completed or the patient failed this part of the Mini-Cog screening.       Immunization History  Administered Date(s) Administered  . Fluad Quad(high Dose 65+) 08/31/2019  . Influenza Split 12/04/2011, 09/28/2012  . Influenza Whole 09/08/2009, 09/08/2010  . Influenza, High Dose Seasonal PF 08/30/2018, 08/31/2019  . Influenza,inj,Quad PF,6+ Mos 08/30/2016, 09/01/2017  . Influenza-Unspecified 10/11/2013, 09/24/2014, 10/21/2015  . Pneumococcal Conjugate-13 07/01/2014  . Pneumococcal Polysaccharide-23 12/08/2009  . Td 02/07/2008  . Zoster 04/18/2012    Qualifies for Shingles Vaccine? Yes  Screening Tests Health Maintenance  Topic Date Due  . OPHTHALMOLOGY EXAM  09/14/2019  . HEMOGLOBIN A1C  10/01/2019  . PAP SMEAR-Modifier  10/07/2019 (Originally 07/02/2015)  . FOOT EXAM  11/05/2019 (Originally 09/19/2018)  . TETANUS/TDAP  12/05/2019 (Originally 02/06/2018)  . MAMMOGRAM  04/04/2020 (Originally 03/01/2019)  . INFLUENZA VACCINE  Completed  . DEXA SCAN  Completed  . PNA vac Low Risk  Adult  Completed    Cancer Screenings: Lung: Low Dose CT Chest recommended if Age 45-80 years, 30 pack-year currently smoking OR have quit w/in 15years.  Patient does not qualify. Breast:  Up to date on Mammogram? No, declined due to COVID   Up to date of Bone Density/Dexa? Yes, completed 10/29/2018 Colorectal: completed 02/07/2013  Additional Screenings:  Hepatitis C Screening: N/A    Declined Pap Smear Plan:   Patient will maintain walking daily and continue medications as prescribed.    I have personally reviewed and noted the following in the patient's chart:   . Medical and social history . Use of alcohol, tobacco or illicit drugs  . Current medications and supplements . Functional ability and status . Nutritional status . Physical activity . Advanced directives . List of other physicians . Hospitalizations, surgeries, and ER visits in previous 12 months . Vitals . Screenings to include cognitive, depression, and falls . Referrals and appointments  In addition, I have reviewed and discussed with patient certain preventive protocols, quality metrics, and best practice recommendations. A written personalized care plan for preventive services as well as general preventive health recommendations were provided to patient.     Andrez Grime, LPN  [id number]

## 2019-10-08 ENCOUNTER — Other Ambulatory Visit (INDEPENDENT_AMBULATORY_CARE_PROVIDER_SITE_OTHER): Payer: Medicare HMO

## 2019-10-08 ENCOUNTER — Other Ambulatory Visit: Payer: Self-pay

## 2019-10-08 DIAGNOSIS — E1169 Type 2 diabetes mellitus with other specified complication: Secondary | ICD-10-CM

## 2019-10-08 DIAGNOSIS — E119 Type 2 diabetes mellitus without complications: Secondary | ICD-10-CM | POA: Diagnosis not present

## 2019-10-08 DIAGNOSIS — E785 Hyperlipidemia, unspecified: Secondary | ICD-10-CM | POA: Diagnosis not present

## 2019-10-08 DIAGNOSIS — E559 Vitamin D deficiency, unspecified: Secondary | ICD-10-CM

## 2019-10-08 DIAGNOSIS — E039 Hypothyroidism, unspecified: Secondary | ICD-10-CM

## 2019-10-08 DIAGNOSIS — I10 Essential (primary) hypertension: Secondary | ICD-10-CM | POA: Diagnosis not present

## 2019-10-08 LAB — COMPREHENSIVE METABOLIC PANEL
ALT: 9 U/L (ref 0–35)
AST: 15 U/L (ref 0–37)
Albumin: 4.8 g/dL (ref 3.5–5.2)
Alkaline Phosphatase: 47 U/L (ref 39–117)
BUN: 24 mg/dL — ABNORMAL HIGH (ref 6–23)
CO2: 24 mEq/L (ref 19–32)
Calcium: 9.4 mg/dL (ref 8.4–10.5)
Chloride: 105 mEq/L (ref 96–112)
Creatinine, Ser: 0.77 mg/dL (ref 0.40–1.20)
GFR: 72.57 mL/min (ref >60.00)
Glucose, Bld: 112 mg/dL — ABNORMAL HIGH (ref 70–99)
Potassium: 4.3 mEq/L (ref 3.5–5.1)
Sodium: 139 mEq/L (ref 135–145)
Total Bilirubin: 0.3 mg/dL (ref 0.2–1.2)
Total Protein: 7.5 g/dL (ref 6.0–8.3)

## 2019-10-08 LAB — LIPID PANEL
Cholesterol: 198 mg/dL (ref 0–200)
HDL: 37.9 mg/dL — ABNORMAL LOW (ref >39.00)
LDL Cholesterol: 133 mg/dL — ABNORMAL HIGH (ref 0–99)
NonHDL: 160.04
Total CHOL/HDL Ratio: 5
Triglycerides: 134 mg/dL (ref 0.0–149.0)
VLDL: 26.8 mg/dL (ref 0.0–40.0)

## 2019-10-08 LAB — CBC WITH DIFFERENTIAL/PLATELET
Basophils Absolute: 0.1 10*3/uL (ref 0.0–0.1)
Basophils Relative: 1 % (ref 0.0–3.0)
Eosinophils Absolute: 0.2 10*3/uL (ref 0.0–0.7)
Eosinophils Relative: 2.6 % (ref 0.0–5.0)
HCT: 35.6 % — ABNORMAL LOW (ref 36.0–46.0)
Hemoglobin: 12.1 g/dL (ref 12.0–15.0)
Lymphocytes Relative: 31 % (ref 12.0–46.0)
Lymphs Abs: 1.9 10*3/uL (ref 0.7–4.0)
MCHC: 33.9 g/dL (ref 30.0–36.0)
MCV: 90.1 fl (ref 78.0–100.0)
Monocytes Absolute: 0.7 10*3/uL (ref 0.1–1.0)
Monocytes Relative: 11.3 % (ref 3.0–12.0)
Neutro Abs: 3.3 10*3/uL (ref 1.4–7.7)
Neutrophils Relative %: 54.1 % (ref 43.0–77.0)
Platelets: 303 10*3/uL (ref 150.0–400.0)
RBC: 3.95 Mil/uL (ref 3.87–5.11)
RDW: 13.5 % (ref 11.5–15.5)
WBC: 6.1 10*3/uL (ref 4.0–10.5)

## 2019-10-08 LAB — TSH: TSH: 1.24 u[IU]/mL (ref 0.35–4.50)

## 2019-10-08 LAB — VITAMIN D 25 HYDROXY (VIT D DEFICIENCY, FRACTURES): VITD: 40.79 ng/mL (ref 30.00–100.00)

## 2019-10-09 ENCOUNTER — Encounter: Payer: Medicare HMO | Admitting: Family Medicine

## 2019-10-09 LAB — HEMOGLOBIN A1C: Hgb A1c MFr Bld: 5.3 % (ref 4.6–6.5)

## 2019-10-10 ENCOUNTER — Other Ambulatory Visit: Payer: Self-pay

## 2019-10-10 ENCOUNTER — Ambulatory Visit (INDEPENDENT_AMBULATORY_CARE_PROVIDER_SITE_OTHER): Payer: Medicare HMO | Admitting: Family Medicine

## 2019-10-10 ENCOUNTER — Encounter: Payer: Self-pay | Admitting: Family Medicine

## 2019-10-10 VITALS — BP 132/74 | HR 75 | Temp 97.5°F | Ht 63.75 in | Wt 132.4 lb

## 2019-10-10 DIAGNOSIS — Z8619 Personal history of other infectious and parasitic diseases: Secondary | ICD-10-CM

## 2019-10-10 DIAGNOSIS — H3582 Retinal ischemia: Secondary | ICD-10-CM

## 2019-10-10 DIAGNOSIS — R69 Illness, unspecified: Secondary | ICD-10-CM | POA: Diagnosis not present

## 2019-10-10 DIAGNOSIS — Z0000 Encounter for general adult medical examination without abnormal findings: Secondary | ICD-10-CM

## 2019-10-10 DIAGNOSIS — E559 Vitamin D deficiency, unspecified: Secondary | ICD-10-CM

## 2019-10-10 DIAGNOSIS — M810 Age-related osteoporosis without current pathological fracture: Secondary | ICD-10-CM | POA: Diagnosis not present

## 2019-10-10 DIAGNOSIS — E039 Hypothyroidism, unspecified: Secondary | ICD-10-CM

## 2019-10-10 DIAGNOSIS — E119 Type 2 diabetes mellitus without complications: Secondary | ICD-10-CM

## 2019-10-10 DIAGNOSIS — I10 Essential (primary) hypertension: Secondary | ICD-10-CM

## 2019-10-10 DIAGNOSIS — F411 Generalized anxiety disorder: Secondary | ICD-10-CM

## 2019-10-10 DIAGNOSIS — E785 Hyperlipidemia, unspecified: Secondary | ICD-10-CM

## 2019-10-10 DIAGNOSIS — E1169 Type 2 diabetes mellitus with other specified complication: Secondary | ICD-10-CM

## 2019-10-10 DIAGNOSIS — F801 Expressive language disorder: Secondary | ICD-10-CM

## 2019-10-10 MED ORDER — OMEPRAZOLE 20 MG PO CPDR: DELAYED_RELEASE_CAPSULE | ORAL | 3 refills | Status: DC

## 2019-10-10 MED ORDER — ALENDRONATE SODIUM 70 MG PO TABS: ORAL_TABLET | ORAL | 3 refills | Status: DC

## 2019-10-10 MED ORDER — FLUTICASONE PROPIONATE 50 MCG/ACT NA SUSP: NASAL | 3 refills | Status: AC

## 2019-10-10 MED ORDER — AMLODIPINE BESYLATE 10 MG PO TABS: 10.0000 mg | ORAL_TABLET | Freq: Every day | ORAL | 3 refills | Status: DC

## 2019-10-10 MED ORDER — METFORMIN HCL 500 MG PO TABS: 500.0000 mg | ORAL_TABLET | Freq: Every day | ORAL | 3 refills | Status: DC

## 2019-10-10 MED ORDER — LEVOTHYROXINE SODIUM 100 MCG PO TABS: 100.0000 ug | ORAL_TABLET | Freq: Every day | ORAL | 3 refills | Status: DC

## 2019-10-10 MED ORDER — GEMFIBROZIL 600 MG PO TABS: 600.0000 mg | ORAL_TABLET | Freq: Two times a day (BID) | ORAL | 3 refills | Status: DC

## 2019-10-10 MED ORDER — SERTRALINE HCL 50 MG PO TABS: 50.0000 mg | ORAL_TABLET | Freq: Every day | ORAL | 3 refills | Status: DC

## 2019-10-10 MED ORDER — QUINAPRIL HCL 40 MG PO TABS: ORAL_TABLET | ORAL | 3 refills | Status: DC

## 2019-10-10 NOTE — Assessment & Plan Note (Signed)
No changes

## 2019-10-10 NOTE — Assessment & Plan Note (Signed)
Very well controlled  Lab Results  Component Value Date   HGBA1C 5.3 10/08/2019   On metformin and diet  Cannot take statin- caused liver enzyme changes On ace  Has dm eye exam planned

## 2019-10-10 NOTE — Assessment & Plan Note (Signed)
R eye  Continues opth f/u with DM eye exam No clinical changes

## 2019-10-10 NOTE — Progress Notes (Signed)
Subjective:    Patient ID: Barbara [name], female    DOB: 26-Jun-1941, 78 y.o.   MRN: [id number]  HPI  Here for health maintenance exam and to review chronic medical problems   Feels all right  Nothing new going on   Had amw on 11/2 Declined mammogram- but her caregiver says she will go with her in march  Declined shingrix vaccine   Last mammogram 3/19  Had to follow with [place] for b9 LN in axilla  Recommended mammogram in a year Self breast exam-no lumps or changes   MMSE score of 15 (2 out of 3 for recall and 0 for language repeat)  Pt is intellectually disabled/also has a speech disability which can affect the test She has a guardian- has not noticed any increased impairments or changes  She occ misplaces things -otherwise ok   Colonoscopy 3/14   Gyn hx-no problems or bleeding at all   Eye exam 10/19 - had it scheduled and had to change her date  Does not notice difference in vision  H/o DM and also retinal ischemia   Mammogram 3/19 -plans to do in march Self breast exam - no lumps   dexa 11/19 - OP of forearm/ overall stable  Taking alendronate since 11/17  Falls -none  Fractures- none  Supplements - taking her ca and D Vit D level good at 40.7 Exercise - walking / likes to get outdoors   Wt Readings from Last 3 Encounters:  10/10/19 132 lb 6 oz (60 kg)  10/03/18 143 lb 8 oz (65.1 kg)  09/26/18 144 lb 8 oz (65.5 kg)  intentional weight loss -because her sister was trying to loose weight  22.90 kg/m  Lots of vegetable   bp is stable today  No cp or palpitations or headaches or edema  No side effects to medicines  BP Readings from Last 3 Encounters:  10/10/19 132/74  10/03/18 116/64  09/26/18 112/64     Lab Results  Component Value Date   CREATININE 0.77 10/08/2019   BUN 24 (H) 10/08/2019   NA 139 10/08/2019   K 4.3 10/08/2019   CL 105 10/08/2019   CO2 24 10/08/2019   Lab Results  Component Value Date   ALT 9 10/08/2019   AST 15 10/08/2019    ALKPHOS 47 10/08/2019   BILITOT 0.3 10/08/2019    Lab Results  Component Value Date   WBC 6.1 10/08/2019   HGB 12.1 10/08/2019   HCT 35.6 (L) 10/08/2019   MCV 90.1 10/08/2019   PLT 303.0 10/08/2019     Hypothyroidism  Pt has no clinical changes No change in energy level/ hair or skin/ edema and no tremor Lab Results  Component Value Date   TSH 1.24 10/08/2019      DM2 Lab Results  Component Value Date   HGBA1C 5.3 10/08/2019  she works on her weight  This is stable/very well controlled Metformin and diet Not on statin -caused inc liver tests in the past  Then zetia caused GI side eff  Taking ace  Due for eye exam - has that planned    Hyperlipidemia Lab Results  Component Value Date   CHOL 198 10/08/2019   CHOL 215 (H) 04/01/2019   CHOL 198 03/15/2018   Lab Results  Component Value Date   HDL 37.90 (L) 10/08/2019   HDL 33.10 (L) 04/01/2019   HDL 30.70 (L) 03/15/2018   Lab Results  Component Value Date   LDLCALC 133 (H)  10/08/2019   LDLCALC 134 (H) 09/01/2017   LDLCALC 118 (H) 06/24/2014   Lab Results  Component Value Date   TRIG 134.0 10/08/2019   TRIG 234.0 (H) 04/01/2019   TRIG 249.0 (H) 03/15/2018   Lab Results  Component Value Date   CHOLHDL 5 10/08/2019   CHOLHDL 7 04/01/2019   CHOLHDL 6 03/15/2018   Lab Results  Component Value Date   LDLDIRECT 123.0 04/01/2019   LDLDIRECT 115.0 03/15/2018   LDLDIRECT 113.0 07/27/2016   Statins caused inc LFTs zetia- GI side eff Takes gemfibrozil - which does control trig well   Patient Active Problem List   Diagnosis Date Noted  . Intertrigo 10/03/2018  . Vitamin D deficiency 03/20/2018  . Osteoporosis 09/25/2016  . Encounter for screening mammogram for breast cancer 09/13/2016  . Estrogen deficiency 09/13/2016  . History of shingles 07/27/2016  . Routine general medical examination at a health care facility 07/08/2015  . Vaginal atrophy 07/01/2013  . Encounter for Medicare annual wellness  exam 07/01/2013  . Colon cancer screening 06/26/2012  . Hypothyroid 06/18/2012  . Encounter for routine gynecological examination 02/15/2012  . Controlled type 2 diabetes mellitus without complication, without long-term current use of insulin ([place]) 06/05/2007  . Hyperlipidemia associated with type 2 diabetes mellitus ([place]) 06/05/2007  . Generalized anxiety disorder 06/05/2007  . EXPRESSIVE LANGUAGE DISORDER 06/05/2007  . ISCHEMIA, RETINAL 06/05/2007  . Essential hypertension 06/05/2007  . ALLERGIC RHINITIS 06/05/2007   Past Medical History:  Diagnosis Date  . Allergy   . Anemia   . Anxiety   . Diabetes mellitus   . Expressive language disorder   . GERD (gastroesophageal reflux disease)   . Hyperlipidemia   . Hypertension   . Learning disability   . Mentally disabled   . Obesity   . Retinal ischemia    history of   Past Surgical History:  Procedure Laterality Date  . CATARACT EXTRACTION Left 2014  . CHOLECYSTECTOMY     Social History   Tobacco Use  . Smoking status: Never Smoker  . Smokeless tobacco: Never Used  Substance Use Topics  . Alcohol use: No    Alcohol/week: 0.0 standard drinks  . Drug use: No   Family History  Problem Relation Age of Onset  . Heart disease Father   . Hypertension Father   . Heart disease Brother   . Diabetes Brother    Allergies  Allergen Reactions  . Atorvastatin     REACTION: increased liver fxn tests  . Ezetimibe     REACTION: stomach upset/malaise  . Sulfonamide Derivatives     REACTION: rash   Current Outpatient Medications on File Prior to Visit  Medication Sig Dispense Refill  . acetaminophen (TYLENOL) 500 MG tablet Take 500 mg by mouth every 6 (six) hours as needed.      . Calcium Carb-Cholecalciferol (CALCIUM + VITAMIN D3 PO) Take 1 tablet by mouth daily.    . ferrous sulfate 325 (65 FE) MG tablet Take 1 tablet (325 mg total) by mouth daily with breakfast.  3  . fexofenadine (ALLEGRA) 180 MG tablet Take 180 mg by  mouth as needed.    . Glucose Blood DISK by In Vitro route.      . nystatin (MYCOSTATIN/NYSTOP) powder Apply topically 2 (two) times daily as needed. To itchy rash under breasts 30 g 2  . vitamin B-12 (CYANOCOBALAMIN) 1000 MCG tablet Take 1 tablet (1,000 mcg total) by mouth daily.     No current facility-administered medications  on file prior to visit.      Review of Systems  Constitutional: Negative for activity change, appetite change, fatigue, fever and unexpected weight change.  HENT: Negative for congestion, ear pain, rhinorrhea, sinus pressure and sore throat.   Eyes: Positive for visual disturbance. Negative for pain and redness.       No vision in R eye baseline   Respiratory: Negative for cough, shortness of breath and wheezing.   Cardiovascular: Negative for chest pain and palpitations.  Gastrointestinal: Negative for abdominal pain, blood in stool, constipation and diarrhea.  Endocrine: Negative for polydipsia and polyuria.  Genitourinary: Negative for dysuria, frequency and urgency.  Musculoskeletal: Negative for arthralgias, back pain and myalgias.  Skin: Negative for pallor and rash.  Allergic/Immunologic: Negative for environmental allergies.  Neurological: Negative for dizziness, syncope and headaches.  Hematological: Negative for adenopathy. Does not bruise/bleed easily.  Psychiatric/Behavioral: Negative for decreased concentration and dysphoric mood. The patient is not nervous/anxious.        Objective:   Physical Exam Constitutional:      General: She is not in acute distress.    Appearance: Normal appearance. She is well-developed and normal weight. She is not ill-appearing or diaphoretic.  HENT:     Head: Normocephalic and atraumatic.     Right Ear: Tympanic membrane, ear canal and external ear normal.     Left Ear: Tympanic membrane, ear canal and external ear normal.     Nose: Nose normal. No congestion.     Mouth/Throat:     Mouth: Mucous membranes are  moist.     Pharynx: Oropharynx is clear. No posterior oropharyngeal erythema.  Eyes:     General: No scleral icterus.    Extraocular Movements: Extraocular movements intact.     Conjunctiva/sclera: Conjunctivae normal.     Pupils: Pupils are equal, round, and reactive to light.  Neck:     Musculoskeletal: Normal range of motion and neck supple. No neck rigidity or muscular tenderness.     Thyroid: No thyromegaly.     Vascular: No carotid bruit or JVD.  Cardiovascular:     Rate and Rhythm: Normal rate and regular rhythm.     Pulses: Normal pulses.     Heart sounds: Normal heart sounds. No gallop.   Pulmonary:     Effort: Pulmonary effort is normal. No respiratory distress.     Breath sounds: Normal breath sounds. No wheezing.     Comments: Good air exch Chest:     Chest wall: No tenderness.  Abdominal:     General: Bowel sounds are normal. There is no distension or abdominal bruit.     Palpations: Abdomen is soft. There is no mass.     Tenderness: There is no abdominal tenderness.     Hernia: No hernia is present.  Genitourinary:    Comments: Breast exam: No mass, nodules, thickening, tenderness, bulging, retraction, inflamation, nipple discharge or skin changes noted.  No axillary or clavicular LA.     Musculoskeletal: Normal range of motion.        General: No tenderness.     Right lower leg: No edema.     Left lower leg: No edema.  Lymphadenopathy:     Cervical: No cervical adenopathy.  Skin:    General: Skin is warm and dry.     Coloration: Skin is not pale.     Findings: No erythema or rash.     Comments: Solar changes Solar lentigines diffusely  Some sks   Neurological:  Mental Status: She is alert. Mental status is at baseline.     Cranial Nerves: No cranial nerve deficit.     Motor: No abnormal muscle tone.     Coordination: Coordination normal.     Gait: Gait normal.     Deep Tendon Reflexes: Reflexes are normal and symmetric. Reflexes normal.     Comments:  baseline  Psychiatric:        Mood and Affect: Mood normal.     Comments: Baseline intellectual impairment and speech imp  Guardian helps with hx Pt is cheerful           Assessment & Plan:   Problem List Items Addressed This Visit      Cardiovascular and Mediastinum   Essential hypertension    bp in fair control at this time  BP Readings from Last 1 Encounters:  10/10/19 132/74   No changes needed Most recent labs reviewed  Disc lifstyle change with low sodium diet and exercise        Relevant Medications   amLODipine (NORVASC) 10 MG tablet   gemfibrozil (LOPID) 600 MG tablet   quinapril (ACCUPRIL) 40 MG tablet     Endocrine   Controlled type 2 diabetes mellitus without complication, without long-term current use of insulin (HCC)    Very well controlled  Lab Results  Component Value Date   HGBA1C 5.3 10/08/2019   On metformin and diet  Cannot take statin- caused liver enzyme changes On ace  Has dm eye exam planned       Relevant Medications   metFORMIN (GLUCOPHAGE) 500 MG tablet   quinapril (ACCUPRIL) 40 MG tablet   Hyperlipidemia associated with type 2 diabetes mellitus (HCC)    Disc goals for lipids and reasons to control them Rev last labs with pt Rev low sat fat diet in detail  Gemfibrozil working well for trig with diet  Cannot take statin (caused liver enzyme elevations)  zetia gave her GI side eff Diet is better lately      Relevant Medications   gemfibrozil (LOPID) 600 MG tablet   metFORMIN (GLUCOPHAGE) 500 MG tablet   quinapril (ACCUPRIL) 40 MG tablet   Hypothyroid    Hypothyroidism  Pt has no clinical changes No change in energy level/ hair or skin/ edema and no tremor Lab Results  Component Value Date   TSH 1.24 10/08/2019          Relevant Medications   levothyroxine (SYNTHROID) 100 MCG tablet     Musculoskeletal and Integument   Osteoporosis    dexa 11/19 up to date On alendronate since 11/17  No falls or fx Good vit D  level and exercise       Relevant Medications   alendronate (FOSAMAX) 70 MG tablet     Other   Generalized anxiety disorder   Relevant Medications   sertraline (ZOLOFT) 50 MG tablet   EXPRESSIVE LANGUAGE DISORDER    No changes      ISCHEMIA, RETINAL    R eye  Continues opth f/u with DM eye exam No clinical changes      Encounter for Medicare annual wellness exam   Routine general medical examination at a health care facility - Primary    Reviewed health habits including diet and exercise and skin cancer prevention Reviewed appropriate screening tests for age  Also reviewed health mt list, fam hx and immunization status , as well as social and family history   See HPI Labs rev amw rev  Pt agreed to get her mammogram in march (her guardian assured she would)  Disc cognitive status (intellectually impaired - this interferes with screen) -seems like her normal self today and her guadian agrees no change  Has eye exam planned (was re sched by her clinic)  dexa utd       History of shingles    Disc option of shingrix vaccine (has had zostavax)      Vitamin D deficiency    Vitamin D level is therapeutic with current supplementation Disc importance of this to bone and overall health Level of 40

## 2019-10-10 NOTE — Patient Instructions (Addendum)
Please try to drink more water  It is important for kidney health   If you are interested in the new shingles vaccine (Shingrix) - call your local pharmacy to check on coverage and availability  If affordable, get on a wait list at your pharmacy to get the vaccine.    we will update your tetanus shot if you get an injury   Stay active  Get outdoors when you can

## 2019-10-10 NOTE — Assessment & Plan Note (Signed)
dexa 11/19 up to date On alendronate since 11/17  No falls or fx Good vit D level and exercise

## 2019-10-10 NOTE — Assessment & Plan Note (Signed)
Disc option of shingrix vaccine (has had zostavax)

## 2019-10-10 NOTE — Assessment & Plan Note (Signed)
Reviewed health habits including diet and exercise and skin cancer prevention Reviewed appropriate screening tests for age  Also reviewed health mt list, fam hx and immunization status , as well as social and family history   See HPI Labs rev amw rev  Pt agreed to get her mammogram in march (her guardian assured she would)  Disc cognitive status (intellectually impaired - this interferes with screen) -seems like her normal self today and her guadian agrees no change  Has eye exam planned (was re sched by her clinic)  dexa utd

## 2019-10-10 NOTE — Assessment & Plan Note (Signed)
Disc goals for lipids and reasons to control them Rev last labs with pt Rev low sat fat diet in detail  Gemfibrozil working well for trig with diet  Cannot take statin (caused liver enzyme elevations)  zetia gave her GI side eff Diet is better lately

## 2019-10-10 NOTE — Assessment & Plan Note (Signed)
Vitamin D level is therapeutic with current supplementation Disc importance of this to bone and overall health Level of 40

## 2019-10-10 NOTE — Assessment & Plan Note (Signed)
Hypothyroidism  Pt has no clinical changes No change in energy level/ hair or skin/ edema and no tremor Lab Results  Component Value Date   TSH 1.24 10/08/2019

## 2019-10-10 NOTE — Assessment & Plan Note (Deleted)
.  td

## 2019-10-10 NOTE — Assessment & Plan Note (Signed)
bp in fair control at this time  BP Readings from Last 1 Encounters:  10/10/19 132/74   No changes needed Most recent labs reviewed  Disc lifstyle change with low sodium diet and exercise

## 2019-12-20 DIAGNOSIS — E119 Type 2 diabetes mellitus without complications: Secondary | ICD-10-CM | POA: Diagnosis not present

## 2019-12-20 LAB — HM DIABETES EYE EXAM

## 2019-12-25 ENCOUNTER — Encounter: Payer: Self-pay | Admitting: Family Medicine

## 2019-12-30 ENCOUNTER — Ambulatory Visit: Payer: Medicare HMO | Attending: Internal Medicine

## 2019-12-30 ENCOUNTER — Ambulatory Visit: Payer: Medicare HMO

## 2019-12-30 DIAGNOSIS — Z23 Encounter for immunization: Secondary | ICD-10-CM | POA: Insufficient documentation

## 2019-12-30 NOTE — Progress Notes (Signed)
   Covid-19 Vaccination Clinic  Name:  Barbara [name]    MRN: [id number] DOB: March 10, 1941  12/30/2019  [name] was observed post Covid-19 immunization for 15 minutes without incidence. She was provided with Vaccine Information Sheet and instruction to access the V-Safe system.   [name] was instructed to call 911 with any severe reactions post vaccine: Marland Kitchen Difficulty breathing  . Swelling of your face and throat  . A fast heartbeat  . A bad rash all over your body  . Dizziness and weakness    Immunizations Administered    Name Date Dose VIS Date Route   Pfizer COVID-19 Vaccine 12/30/2019  2:11 PM 0.3 mL 11/15/2019 Intramuscular   Manufacturer: [place]   Lot: [id number]   [place]: [id number]

## 2020-01-20 ENCOUNTER — Ambulatory Visit: Payer: Medicare HMO | Attending: Internal Medicine

## 2020-01-20 DIAGNOSIS — Z23 Encounter for immunization: Secondary | ICD-10-CM

## 2020-01-20 NOTE — Progress Notes (Signed)
   Covid-19 Vaccination Clinic  Name:  [name]    MRN: [id number] DOB: 14-Nov-1941  01/20/2020  [name] was observed post Covid-19 immunization for 15 minutes without incidence. She was provided with Vaccine Information Sheet and instruction to access the V-Safe system.   [name] was instructed to call 911 with any severe reactions post vaccine: Marland Kitchen Difficulty breathing  . Swelling of your face and throat  . A fast heartbeat  . A bad rash all over your body  . Dizziness and weakness    Immunizations Administered    Name Date Dose VIS Date Route   Pfizer COVID-19 Vaccine 01/20/2020 12:05 PM 0.3 mL 11/15/2019 Intramuscular   Manufacturer: [place]   Lot: [id number]   [place]: [id number]

## 2020-04-02 ENCOUNTER — Other Ambulatory Visit: Payer: Self-pay | Admitting: *Deleted

## 2020-04-02 MED ORDER — NYSTATIN 100000 UNIT/GM EX POWD: Freq: Two times a day (BID) | CUTANEOUS | 1 refills | Status: DC | PRN

## 2020-04-02 NOTE — Telephone Encounter (Signed)
Last filled on 10/03/18 #30 g with 2 refills, CPE was on 10/10/19, please advise   [facility]

## 2020-04-06 DIAGNOSIS — R69 Illness, unspecified: Secondary | ICD-10-CM | POA: Diagnosis not present

## 2020-04-06 DIAGNOSIS — F329 Major depressive disorder, single episode, unspecified: Secondary | ICD-10-CM | POA: Diagnosis not present

## 2020-04-06 DIAGNOSIS — I10 Essential (primary) hypertension: Secondary | ICD-10-CM | POA: Diagnosis not present

## 2020-04-06 DIAGNOSIS — E119 Type 2 diabetes mellitus without complications: Secondary | ICD-10-CM | POA: Diagnosis not present

## 2020-04-06 DIAGNOSIS — M810 Age-related osteoporosis without current pathological fracture: Secondary | ICD-10-CM | POA: Diagnosis not present

## 2020-04-06 DIAGNOSIS — Z7984 Long term (current) use of oral hypoglycemic drugs: Secondary | ICD-10-CM | POA: Diagnosis not present

## 2020-04-06 DIAGNOSIS — E039 Hypothyroidism, unspecified: Secondary | ICD-10-CM | POA: Diagnosis not present

## 2020-04-06 DIAGNOSIS — E785 Hyperlipidemia, unspecified: Secondary | ICD-10-CM | POA: Diagnosis not present

## 2020-04-06 DIAGNOSIS — Z7982 Long term (current) use of aspirin: Secondary | ICD-10-CM | POA: Diagnosis not present

## 2020-04-06 DIAGNOSIS — K219 Gastro-esophageal reflux disease without esophagitis: Secondary | ICD-10-CM | POA: Diagnosis not present

## 2020-09-11 ENCOUNTER — Other Ambulatory Visit: Payer: Self-pay | Admitting: Family Medicine

## 2020-09-11 NOTE — Telephone Encounter (Signed)
Please schedule PE in nov or dec and refill until then

## 2020-09-11 NOTE — Telephone Encounter (Signed)
I spoke to patient and she said her sister's out of town until next week. She said she'll call back to schedule appointment in November for her physical.

## 2020-09-11 NOTE — Telephone Encounter (Signed)
Med refilled once and Carrie will reach out to pt to try and get appt scheduled  

## 2020-09-11 NOTE — Telephone Encounter (Signed)
Last CPE was 10/10/19 and no future appts, please advise

## 2020-09-25 ENCOUNTER — Other Ambulatory Visit: Payer: Self-pay | Admitting: Family Medicine

## 2020-10-06 ENCOUNTER — Telehealth: Payer: Self-pay

## 2020-10-06 NOTE — Telephone Encounter (Signed)
I spoke with Dedra Skeens (DPR signed) pt has been taking metformin and sertraline for years with no change in dosage. Pt will continue meds that she has been taking for years. Dedra Skeens is not sure last BS. Alma said pt has been itching all over for several months but itching has gotten worse. No rash per Alma. No difficulty breathing and no swelling of face,mouth, tongue or throat. To Alma's knowledge no new meds, soaps,detergents, clothes or furniture. Pt already has appt with Avie Echevaria NP on 10/07/20 at 3 PM. UC & ED precautions given and Alma voiced understanding. Will send note to Dr Glori Bickers as PCP and Avie Echevaria NP as provider with appt on 10/07/20.

## 2020-10-06 NOTE — Telephone Encounter (Signed)
Will discuss at upcoming appt.

## 2020-10-06 NOTE — Telephone Encounter (Signed)
[facility] - Client TELEPHONE ADVICE RECORD [facility] Patient Name: Barbara [name] Gender: Female DOB: [birth year] Age: 79 Y 55 M 22 D Return Phone Number: [phone] (Primary) Address: [name] [address] Client [facility] - Client Client Site [place], Roque Lias - MD Contact Type Call Who Is Calling Patient / Member / Family / Caregiver Call Type Triage / Clinical Caller Name Arty Baumgartner Relationship To Patient Sibling Return Phone Number [phone] (Primary) Chief Complaint Medication reaction Reason for Call Symptomatic / Request for [place] states she is calling on behalf of her sister who is having an allergic reaction to her Metformin HCL 500mg  medication. She is having severe itching and redness on her head. Translation No Nurse Assessment Nurse: Raphael Gibney, RN, Vanita Ingles Date/Time (Eastern Time): 10/06/2020 11:54:26 AM Confirm and document reason for call. If symptomatic, describe symptoms. ---Caller states she thinks her sister is having an allergic reaction to her metformin 500 mg. She is itching all over. Skin is red from scratching. No rash. Has had itching for a while. Does the patient have any new or worsening symptoms? ---Yes Will a triage be completed? ---Yes Related visit to physician within the last 2 weeks? ---No Does the PT have any chronic conditions? (i.e. diabetes, asthma, this includes High risk factors for pregnancy, etc.) ---Yes List chronic conditions. ---diabetes; Is this a behavioral health or substance abuse call? ---No Guidelines Guideline Title Affirmed Question Affirmed Notes Nurse Date/Time Eilene Ghazi Time) Itching - Widespread Taking prescription medication that could cause itching (e.g., codeine/morphine/other opiates, aspirin) Raphael Gibney, RN, Vanita Ingles 10/06/2020 11:57:44 AM Disp. Time Eilene Ghazi  Time) Disposition Final User 10/06/2020 12:05:41 PM Call PCP within 24 Hours Yes Raphael Gibney, RN, Vanita Ingles PLEASE NOTE: All timestamps contained within this report are represented as [place] Standard Time. CONFIDENTIALTY NOTICE: This fax transmission is intended only for the addressee. It contains information that is legally privileged, confidential or otherwise protected from use or disclosure. If you are not the intended recipient, you are strictly prohibited from reviewing, disclosing, copying using or disseminating any of this information or taking any action in reliance on or regarding this information. If you have received this fax in error, please notify us immediately by telephone so that we can arrange for its return to [place]. Phone: [phone], Toll-Free: [phone], Fax: [phone] Page: 2 of 2 Call Id: [id number] [place] Disagree/Comply Comply Caller Understands Yes PreDisposition Call Doctor Care Advice Given Per Guideline CALL PCP WITHIN 24 HOURS: * You need to discuss this with your doctor (or NP/PA) within the next 24 hours. * You become worse CALL BACK IF: CARE ADVICE given per Itching, Widespread (Adult) guideline. MOISTURIZE THE SKIN WITH LOTION: * The best time to apply lotion is right after a bath or shower when the skin is moist. The lotion or cream will help seal in the moisture. * CETIRIZINE (REACTINE, ZYRTEC): The adult dose is 10 mg and you take it once a day. Cetirizine is available in the [place] as Zyrtec and in [place] as Reactine. * LORATADINE (ALAVERT, CLARITIN): The adult dose is 10 mg and you take it once a day. Loratadine is available in the [place] as Engineer, maintenance (IT); it is available in [place] as Claritin. Comments User: Dannielle Burn, RN Date/Time Eilene Ghazi Time): 10/06/2020 12:03:47 PM caller wants to know about continuing her metformin. Please call sister back. User: Dannielle Burn, RN Date/Time Eilene Ghazi Time): 10/06/2020 12:08:43 PM  warm  transferred to the office for appt Referrals REFERRED TO PCP OFFICE

## 2020-10-07 ENCOUNTER — Other Ambulatory Visit: Payer: Self-pay

## 2020-10-07 ENCOUNTER — Ambulatory Visit (INDEPENDENT_AMBULATORY_CARE_PROVIDER_SITE_OTHER): Payer: Medicare HMO | Admitting: Internal Medicine

## 2020-10-07 ENCOUNTER — Encounter: Payer: Self-pay | Admitting: Internal Medicine

## 2020-10-07 VITALS — BP 98/64 | HR 100 | Temp 98.4°F | Wt 132.8 lb

## 2020-10-07 DIAGNOSIS — L309 Dermatitis, unspecified: Secondary | ICD-10-CM | POA: Diagnosis not present

## 2020-10-07 DIAGNOSIS — L308 Other specified dermatitis: Secondary | ICD-10-CM

## 2020-10-07 MED ORDER — TRIAMCINOLONE ACETONIDE 0.1 % EX CREA: 1.0000 "application " | TOPICAL_CREAM | Freq: Two times a day (BID) | CUTANEOUS | 0 refills | Status: AC

## 2020-10-07 NOTE — Patient Instructions (Addendum)
I have sent in triamcinolone cream to use in the vaginal area 2 x day for itching. Do not place this internally.  Use Selsun blue shampoo on your head  Follow up with PCP if symptoms persist or worsen   Eczema Eczema is a broad term for a group of skin conditions that cause skin to become rough and inflamed. Each type of eczema has different triggers, symptoms, and treatments. Eczema of any type is usually itchy and symptoms range from mild to severe. Eczema and its symptoms are not spread from person to person (are not contagious). It can appear on different parts of the body at different times. Your eczema may not look the same as someone else's eczema. What are the types of eczema? Atopic dermatitis This is a long-term (chronic) skin disease that keeps coming back (recurring). Usual symptoms are dry skin and small, solid pimples that may swell and leak fluid (weep). Contact dermatitis  This happens when something irritates the skin and causes a rash. The irritation can come from substances that you are allergic to (allergens), such as poison ivy, chemicals, or medicines that were applied to your skin. Dyshidrotic eczema This is a form of eczema on the hands and feet. It shows up as very itchy, fluid-filled blisters. It can affect people of any age, but is more common before age 56. Hand eczema  This causes very itchy areas of skin on the palms and sides of the hands and fingers. This type of eczema is common in industrial jobs where you may be exposed to many different types of irritants. Lichen simplex chronicus This type of eczema occurs when a person constantly scratches one area of the body. Repeated scratching of the area leads to thickened skin (lichenification). Lichen simplex chronicus can occur along with other types of eczema. It is more common in adults, but may be seen in children as well. Nummular eczema This is a common type of eczema. It has no known cause. It typically  causes a red, circular, crusty lesion (plaque) that may be itchy. Scratching may become a habit and can cause bleeding. Nummular eczema occurs most often in people of middle-age or older. It most often affects the hands. Seborrheic dermatitis This is a common skin disease that mainly affects the scalp. It may also affect any oily areas of the body, such as the face, sides of nose, eyebrows, ears, eyelids, and chest. It is marked by small scaling and redness of the skin (erythema). This can affect people of all ages. In infants, this condition is known as Chartered certified accountant." Stasis dermatitis This is a common skin disease that usually appears on the legs and feet. It most often occurs in people who have a condition that prevents blood from being pumped through the veins in the legs (chronic venous insufficiency). Stasis dermatitis is a chronic condition that needs long-term management. How is eczema diagnosed? Your health care provider will examine your skin and review your medical history. He or she may also give you skin patch tests. These tests involve taking patches that contain possible allergens and placing them on your back. He or she will then check in a few days to see if an allergic reaction occurred. What are the common treatments? Treatment for eczema is based on the type of eczema you have. Hydrocortisone steroid medicine can relieve itching quickly and help reduce inflammation. This medicine may be prescribed or obtained over-the-counter, depending on the strength of the medicine that is needed. Follow  these instructions at home:  Take over-the-counter and prescription medicines only as told by your health care provider.  Use creams or ointments to moisturize your skin. Do not use lotions.  Learn what triggers or irritates your symptoms. Avoid these things.  Treat symptom flare-ups quickly.  Do not itch your skin. This can make your rash worse.  Keep all follow-up visits as told by your  health care provider. This is important. Where to find more information  The American Academy of Dermatology: http://jones-macias.info/  The National Eczema Association: www.nationaleczema.org Contact a health care provider if:  You have serious itching, even with treatment.  You regularly scratch your skin until it bleeds.  Your rash looks different than usual.  Your skin is painful, swollen, or more red than usual.  You have a fever. Summary  There are eight general types of eczema. Each type has different triggers.  Eczema of any type causes itching that may range from mild to severe.  Treatment varies based on the type of eczema you have. Hydrocortisone steroid medicine can help with itching and inflammation.  Protecting your skin is the best way to prevent eczema. Use moisturizers and lotions. Avoid triggers and irritants, and treat flare-ups quickly. This information is not intended to replace advice given to you by your health care provider. Make sure you discuss any questions you have with your health care provider. Document Revised: 11/03/2017 Document Reviewed: 04/06/2017 Elsevier Patient Education  [place].

## 2020-10-07 NOTE — Progress Notes (Signed)
Subjective:    Patient ID: Barbara [name], female    DOB: [birth year], 79 y.o.   MRN: [id number]  HPI  Pt presents to the clinic today with c/o itching of her scalp, back and vaginal area. This started 2 months ago. She denies recent hair color, perm. She denies vaginal discharge, odor or urinary incontinence. She has no history of liver disease, CMET from 113/2020 reviewed. She does have a history of thyroid issues, currently managed on Levothyroxine but has not had her TSH, Free T4 checked since 10/2019. She denies changes in diet, medications, soaps, lotions or detergents.   Review of Systems  Past Medical History:  Diagnosis Date  . Allergy   . Anemia   . Anxiety   . Diabetes mellitus   . Expressive language disorder   . GERD (gastroesophageal reflux disease)   . Hyperlipidemia   . Hypertension   . Learning disability   . Mentally disabled   . Obesity   . Retinal ischemia    history of    Current Outpatient Medications  Medication Sig Dispense Refill  . acetaminophen (TYLENOL) 500 MG tablet Take 500 mg by mouth every 6 (six) hours as needed.      Marland Kitchen alendronate (FOSAMAX) 70 MG tablet TAKE 1 TABLET BY MOUTH 30 MINUTES BEFOREBREAKFAST ONCE A WEEK WITH ATLEAST 8 OZ. OF WATER. 12 tablet 3  . amLODipine (NORVASC) 10 MG tablet Take 1 tablet (10 mg total) by mouth daily. 90 tablet 3  . Calcium Carb-Cholecalciferol (CALCIUM + VITAMIN D3 PO) Take 1 tablet by mouth daily.    . ferrous sulfate 325 (65 FE) MG tablet Take 1 tablet (325 mg total) by mouth daily with breakfast.  3  . fexofenadine (ALLEGRA) 180 MG tablet Take 180 mg by mouth as needed.    . fluticasone (FLONASE) 50 MCG/ACT nasal spray USE TWO SPRAYS IN EACH NOSTRIL DAILY 48 g 3  . gemfibrozil (LOPID) 600 MG tablet Take 1 tablet (600 mg total) by mouth 2 (two) times daily. 180 tablet 3  . Glucose Blood DISK by In Vitro route.      Marland Kitchen levothyroxine (SYNTHROID) 100 MCG tablet Take 1 tablet (100 mcg total) by mouth daily  before breakfast. 90 tablet 3  . metFORMIN (GLUCOPHAGE) 500 MG tablet Take 1 tablet (500 mg total) by mouth daily with breakfast. 90 tablet 3  . nystatin (MYCOSTATIN/NYSTOP) powder Apply topically 2 (two) times daily as needed. To itchy rash under breasts 30 g 1  . omeprazole (PRILOSEC) 20 MG capsule TAKE ONE CAPSULE BY MOUTH DAILY BEFORE BREAKFAST 90 capsule 3  . quinapril (ACCUPRIL) 40 MG tablet TAKE ONE-HALF (0.5) BY MOUTH DAILY 45 tablet 3  . sertraline (ZOLOFT) 50 MG tablet TAKE 1 TABLET BY MOUTH EVERY DAY 90 tablet 0  . vitamin B-12 (CYANOCOBALAMIN) 1000 MCG tablet Take 1 tablet (1,000 mcg total) by mouth daily.     No current facility-administered medications for this visit.    Allergies  Allergen Reactions  . Atorvastatin     REACTION: increased liver fxn tests  . Ezetimibe     REACTION: stomach upset/malaise  . Sulfonamide Derivatives     REACTION: rash    Family History  Problem Relation Age of Onset  . Heart disease Father   . Hypertension Father   . Heart disease Brother   . Diabetes Brother     Social History   Socioeconomic History  . Marital status: Divorced    Spouse name: Not on  file  . Number of children: 0  . Years of education: 9th grade  . Highest education level: Not on file  Occupational History  . Occupation: disabled    Fish farm manager: Not Employed  . Occupation: Disabled    Employer: Not Employed  Tobacco Use  . Smoking status: Never Smoker  . Smokeless tobacco: Never Used  Vaping Use  . Vaping Use: Never used  Substance and Sexual Activity  . Alcohol use: No    Alcohol/week: 0.0 standard drinks  . Drug use: No  . Sexual activity: Never  Other Topics Concern  . Not on file  Social History Narrative   Ex-Husband has pschizoaffective disorder, family close by/ lot of support, works in garden   Disabled - mental retardation   Sister Arty Baumgartner is guardian   Social Determinants of Health   Financial Resource Strain: Low Risk   . Difficulty  of Paying Living Expenses: Not hard at all  Food Insecurity: No Food Insecurity  . Worried About Charity fundraiser in the Last Year: Never true  . Ran Out of Food in the Last Year: Never true  Transportation Needs: No Transportation Needs  . Lack of Transportation (Medical): No  . Lack of Transportation (Non-Medical): No  Physical Activity: Inactive  . Days of Exercise per Week: 0 days  . Minutes of Exercise per Session: 0 min  Stress: No Stress Concern Present  . Feeling of Stress : Not at all  Social Connections:   . Frequency of Communication with Friends and Family: Not on file  . Frequency of Social Gatherings with Friends and Family: Not on file  . Attends Religious Services: Not on file  . Active Member of Clubs or Organizations: Not on file  . Attends Archivist Meetings: Not on file  . Marital Status: Not on file  Intimate Partner Violence: Not At Risk  . Fear of Current or Ex-Partner: No  . Emotionally Abused: No  . Physically Abused: No  . Sexually Abused: No     Constitutional: Denies fever, malaise, fatigue, headache or abrupt weight changes.  Respiratory: Denies difficulty breathing, shortness of breath, cough or sputum production.   Cardiovascular: Denies chest pain, chest tightness, palpitations or swelling in the hands or feet.  GU: Pt reports vaginal itching. Denies urgency, frequency, pain with urination, burning sensation, blood in urine, odor or discharge. Skin: Pt reports itching of scalp, back. Denies redness, rashes, lesions or ulcercations.    No other specific complaints in a complete review of systems (except as listed in HPI above).     Objective:   Physical Exam  BP 98/64 (BP Location: Right Arm, Patient Position: Sitting, Cuff Size: Normal)   Pulse 100   Temp 98.4 F (36.9 C) (Temporal)   Wt 132 lb 12.8 oz (60.2 kg)   SpO2 98%   BMI 22.97 kg/m   Wt Readings from Last 3 Encounters:  10/10/19 132 lb 6 oz (60 kg)  10/03/18 143  lb 8 oz (65.1 kg)  09/26/18 144 lb 8 oz (65.5 kg)    General: Appears her stated age, well developed, well nourished in NAD. Skin: Eczema noted of scalp. No rash noted of back. Cardiovascular: Normal rate. Pulmonary/Chest: Normal effort.o distention or masses noted. Liver, spleen and kidneys non palpable. Pelvic: Normal female anatomy. Eczema noted of bilateral labia. No other rash noted. No vaginal discharge noted.   BMET    Component Value Date/Time   NA 139 10/08/2019 0908  K 4.3 10/08/2019 0908   CL 105 10/08/2019 0908   CO2 24 10/08/2019 0908   GLUCOSE 112 (H) 10/08/2019 0908   GLUCOSE 177 (H) 12/15/2006 1025   BUN 24 (H) 10/08/2019 0908   CREATININE 0.77 10/08/2019 0908   CALCIUM 9.4 10/08/2019 0908   GFRNONAA 77.94 08/31/2010 0928   GFRAA 92 12/10/2008 0933    Lipid Panel     Component Value Date/Time   CHOL 198 10/08/2019 0908   TRIG 134.0 10/08/2019 0908   TRIG 298 (HH) 12/15/2006 1025   HDL 37.90 (L) 10/08/2019 0908   CHOLHDL 5 10/08/2019 0908   VLDL 26.8 10/08/2019 0908   LDLCALC 133 (H) 10/08/2019 0908    CBC    Component Value Date/Time   WBC 6.1 10/08/2019 0908   RBC 3.95 10/08/2019 0908   HGB 12.1 10/08/2019 0908   HCT 35.6 (L) 10/08/2019 0908   PLT 303.0 10/08/2019 0908   MCV 90.1 10/08/2019 0908   MCHC 33.9 10/08/2019 0908   RDW 13.5 10/08/2019 0908   LYMPHSABS 1.9 10/08/2019 0908   MONOABS 0.7 10/08/2019 0908   EOSABS 0.2 10/08/2019 0908   BASOSABS 0.1 10/08/2019 0908    Hgb A1C Lab Results  Component Value Date   HGBA1C 5.3 10/08/2019           Assessment & Plan:  Eczema of Scalp, Eczema of Labia:  Will check CBC, CMET, TSH, Free T4 today to rule out other causes of pruritus RX for Triamcinolone cream BID prn Start Selsun Blue shampoo for your scalp Avoid perms for the next few weeks  Follow up with PCP if symptoms persist or worsen  Webb Silversmith, NP This visit occurred during the SARS-CoV-2 public health emergency.   Safety protocols were in place, including screening questions prior to the visit, additional usage of staff PPE, and extensive cleaning of exam room while observing appropriate contact time as indicated for disinfecting solutions.

## 2020-10-08 LAB — T4, FREE: Free T4: 0.73 ng/dL (ref 0.60–1.60)

## 2020-10-08 LAB — TSH: TSH: 1.87 u[IU]/mL (ref 0.35–4.50)

## 2020-10-08 LAB — COMPREHENSIVE METABOLIC PANEL
ALT: 7 U/L (ref 0–35)
AST: 14 U/L (ref 0–37)
Albumin: 4.8 g/dL (ref 3.5–5.2)
Alkaline Phosphatase: 46 U/L (ref 39–117)
BUN: 27 mg/dL — ABNORMAL HIGH (ref 6–23)
CO2: 25 mEq/L (ref 19–32)
Calcium: 9.6 mg/dL (ref 8.4–10.5)
Chloride: 101 mEq/L (ref 96–112)
Creatinine, Ser: 0.93 mg/dL (ref 0.40–1.20)
GFR: 58.81 mL/min — ABNORMAL LOW (ref >60.00)
Glucose, Bld: 90 mg/dL (ref 70–99)
Potassium: 4.3 mEq/L (ref 3.5–5.1)
Sodium: 140 mEq/L (ref 135–145)
Total Bilirubin: 0.3 mg/dL (ref 0.2–1.2)
Total Protein: 7.2 g/dL (ref 6.0–8.3)

## 2020-10-08 LAB — CBC
HCT: 34.9 % — ABNORMAL LOW (ref 36.0–46.0)
Hemoglobin: 11.8 g/dL — ABNORMAL LOW (ref 12.0–15.0)
MCHC: 33.9 g/dL (ref 30.0–36.0)
MCV: 90.6 fl (ref 78.0–100.0)
Platelets: 317 10*3/uL (ref 150.0–400.0)
RBC: 3.86 Mil/uL — ABNORMAL LOW (ref 3.87–5.11)
RDW: 13.6 % (ref 11.5–15.5)
WBC: 7 10*3/uL (ref 4.0–10.5)

## 2020-10-16 ENCOUNTER — Other Ambulatory Visit: Payer: Self-pay | Admitting: Family Medicine

## 2020-11-02 DIAGNOSIS — R69 Illness, unspecified: Secondary | ICD-10-CM | POA: Diagnosis not present

## 2020-11-18 ENCOUNTER — Other Ambulatory Visit: Payer: Self-pay | Admitting: Family Medicine

## 2020-11-24 ENCOUNTER — Other Ambulatory Visit: Payer: Self-pay | Admitting: Family Medicine

## 2020-11-29 ENCOUNTER — Telehealth: Payer: Self-pay | Admitting: Family Medicine

## 2020-11-29 DIAGNOSIS — E1169 Type 2 diabetes mellitus with other specified complication: Secondary | ICD-10-CM

## 2020-11-29 DIAGNOSIS — E039 Hypothyroidism, unspecified: Secondary | ICD-10-CM

## 2020-11-29 DIAGNOSIS — E559 Vitamin D deficiency, unspecified: Secondary | ICD-10-CM

## 2020-11-29 DIAGNOSIS — I10 Essential (primary) hypertension: Secondary | ICD-10-CM

## 2020-11-29 DIAGNOSIS — E119 Type 2 diabetes mellitus without complications: Secondary | ICD-10-CM

## 2020-11-29 NOTE — Telephone Encounter (Signed)
-----   Message from Ellamae Sia sent at 11/17/2020  4:03 PM EST ----- Regarding: Lab orders for Monday, 12.27.21 Patient is scheduled for CPX labs, please order future labs, Thanks , Karna Christmas

## 2020-12-01 ENCOUNTER — Other Ambulatory Visit (INDEPENDENT_AMBULATORY_CARE_PROVIDER_SITE_OTHER): Payer: Medicare HMO

## 2020-12-01 ENCOUNTER — Telehealth: Payer: Self-pay

## 2020-12-01 ENCOUNTER — Ambulatory Visit: Payer: Medicare HMO

## 2020-12-01 ENCOUNTER — Other Ambulatory Visit: Payer: Self-pay

## 2020-12-01 DIAGNOSIS — E039 Hypothyroidism, unspecified: Secondary | ICD-10-CM

## 2020-12-01 DIAGNOSIS — I10 Essential (primary) hypertension: Secondary | ICD-10-CM

## 2020-12-01 DIAGNOSIS — E119 Type 2 diabetes mellitus without complications: Secondary | ICD-10-CM | POA: Diagnosis not present

## 2020-12-01 DIAGNOSIS — E1169 Type 2 diabetes mellitus with other specified complication: Secondary | ICD-10-CM

## 2020-12-01 DIAGNOSIS — E785 Hyperlipidemia, unspecified: Secondary | ICD-10-CM | POA: Diagnosis not present

## 2020-12-01 DIAGNOSIS — E559 Vitamin D deficiency, unspecified: Secondary | ICD-10-CM

## 2020-12-01 LAB — TSH: TSH: 2.84 u[IU]/mL (ref 0.35–4.50)

## 2020-12-01 LAB — CBC WITH DIFFERENTIAL/PLATELET
Basophils Absolute: 0.1 10*3/uL (ref 0.0–0.1)
Basophils Relative: 0.8 % (ref 0.0–3.0)
Eosinophils Absolute: 0.2 10*3/uL (ref 0.0–0.7)
Eosinophils Relative: 2.6 % (ref 0.0–5.0)
HCT: 35.6 % — ABNORMAL LOW (ref 36.0–46.0)
Hemoglobin: 12.1 g/dL (ref 12.0–15.0)
Lymphocytes Relative: 27.6 % (ref 12.0–46.0)
Lymphs Abs: 2 10*3/uL (ref 0.7–4.0)
MCHC: 34 g/dL (ref 30.0–36.0)
MCV: 90 fl (ref 78.0–100.0)
Monocytes Absolute: 0.7 10*3/uL (ref 0.1–1.0)
Monocytes Relative: 10 % (ref 3.0–12.0)
Neutro Abs: 4.2 10*3/uL (ref 1.4–7.7)
Neutrophils Relative %: 59 % (ref 43.0–77.0)
Platelets: 300 10*3/uL (ref 150.0–400.0)
RBC: 3.96 Mil/uL (ref 3.87–5.11)
RDW: 13.5 % (ref 11.5–15.5)
WBC: 7.1 10*3/uL (ref 4.0–10.5)

## 2020-12-01 LAB — COMPREHENSIVE METABOLIC PANEL
ALT: 7 U/L (ref 0–35)
AST: 13 U/L (ref 0–37)
Albumin: 4.7 g/dL (ref 3.5–5.2)
Alkaline Phosphatase: 52 U/L (ref 39–117)
BUN: 23 mg/dL (ref 6–23)
CO2: 24 mEq/L (ref 19–32)
Calcium: 10 mg/dL (ref 8.4–10.5)
Chloride: 104 mEq/L (ref 96–112)
Creatinine, Ser: 0.8 mg/dL (ref 0.40–1.20)
GFR: 70.38 mL/min (ref >60.00)
Glucose, Bld: 115 mg/dL — ABNORMAL HIGH (ref 70–99)
Potassium: 4.5 mEq/L (ref 3.5–5.1)
Sodium: 139 mEq/L (ref 135–145)
Total Bilirubin: 0.4 mg/dL (ref 0.2–1.2)
Total Protein: 7.2 g/dL (ref 6.0–8.3)

## 2020-12-01 LAB — LIPID PANEL
Cholesterol: 194 mg/dL (ref 0–200)
HDL: 44 mg/dL (ref >39.00)
LDL Cholesterol: 123 mg/dL — ABNORMAL HIGH (ref 0–99)
NonHDL: 149.69
Total CHOL/HDL Ratio: 4
Triglycerides: 134 mg/dL (ref 0.0–149.0)
VLDL: 26.8 mg/dL (ref 0.0–40.0)

## 2020-12-01 LAB — HEMOGLOBIN A1C: Hgb A1c MFr Bld: 5.4 % (ref 4.6–6.5)

## 2020-12-01 LAB — VITAMIN D 25 HYDROXY (VIT D DEFICIENCY, FRACTURES): VITD: 34.92 ng/mL (ref 30.00–100.00)

## 2020-12-01 NOTE — Telephone Encounter (Signed)
Called patient numerous times at her home number and her sister (Alma's) number. Patient never answered. Left messages letting patient know I called and appointment was cancelled. Advised to call and reschedule.

## 2020-12-01 NOTE — Progress Notes (Deleted)
Subjective:   Barbara [name] is a 79 y.o. female who presents for Medicare Annual (Subsequent) preventive examination.  Review of Systems: N/A      I connected with the patient today by telephone and verified that I am speaking with the correct person using two identifiers. Location patient: home Location nurse: work Persons participating in the telephone visit: patient, nurse.   I discussed the limitations, risks, security and privacy concerns of performing an evaluation and management service by telephone and the availability of in person appointments. I also discussed with the patient that there may be a patient responsible charge related to this service. The patient expressed understanding and verbally consented to this telephonic visit.              Objective:    There were no vitals filed for this visit. There is no height or weight on file to calculate BMI.  Advanced Directives 10/07/2019 09/26/2018 09/01/2017 08/30/2016  Does Patient Have a Medical Advance Directive? No No Yes Yes  Type of Advance Directive - Public librarian;Living will [place];Living will  Does patient want to make changes to medical advance directive? - - - No - Patient declined  Copy of [place] in Chart? - - No - copy requested No - copy requested  Would patient like information on creating a medical advance directive? No - Patient declined No - Patient declined - -    Current Medications (verified) Outpatient Encounter Medications as of 12/01/2020  Medication Sig  . levothyroxine (SYNTHROID) 100 MCG tablet TAKE 1 TABLET BY MOUTH DAILY BEFORE BREAKFAST.  . metFORMIN (GLUCOPHAGE) 500 MG tablet TAKE 1 TABLET BY MOUTH EVERY DAY WITH BREAKFAST  . acetaminophen (TYLENOL) 500 MG tablet Take 500 mg by mouth every 6 (six) hours as needed.    Marland Kitchen alendronate (FOSAMAX) 70 MG tablet TAKE 1 TABLET BY MOUTH 30 MINUTES BEFOREBREAKFAST ONCE A WEEK WITH  ATLEAST 8 OZ. OF WATER.  Marland Kitchen amLODipine (NORVASC) 10 MG tablet TAKE 1 TABLET BY MOUTH EVERY DAY  . Calcium Carb-Cholecalciferol (CALCIUM + VITAMIN D3 PO) Take 1 tablet by mouth daily.  . ferrous sulfate 325 (65 FE) MG tablet Take 1 tablet (325 mg total) by mouth daily with breakfast.  . fexofenadine (ALLEGRA) 180 MG tablet Take 180 mg by mouth as needed.  . fluticasone (FLONASE) 50 MCG/ACT nasal spray USE TWO SPRAYS IN EACH NOSTRIL DAILY  . gemfibrozil (LOPID) 600 MG tablet TAKE 1 TABLET BY MOUTH TWICE A DAY  . Glucose Blood DISK by In Vitro route.    . nystatin (MYCOSTATIN/NYSTOP) powder Apply topically 2 (two) times daily as needed. To itchy rash under breasts  . omeprazole (PRILOSEC) 20 MG capsule TAKE ONE CAPSULE BY MOUTH DAILY BEFORE BREAKFAST  . quinapril (ACCUPRIL) 40 MG tablet TAKE ONE-HALF (0.5) BY MOUTH DAILY  . sertraline (ZOLOFT) 50 MG tablet TAKE 1 TABLET BY MOUTH EVERY DAY  . triamcinolone cream (KENALOG) 0.1 % Apply 1 application topically 2 (two) times daily.  . vitamin B-12 (CYANOCOBALAMIN) 1000 MCG tablet Take 1 tablet (1,000 mcg total) by mouth daily.   No facility-administered encounter medications on file as of 12/01/2020.    Allergies (verified) Atorvastatin, Ezetimibe, and Sulfonamide derivatives   History: Past Medical History:  Diagnosis Date  . Allergy   . Anemia   . Anxiety   . Diabetes mellitus   . Expressive language disorder   . GERD (gastroesophageal reflux disease)   .  Hyperlipidemia   . Hypertension   . Learning disability   . Mentally disabled   . Obesity   . Retinal ischemia    history of   Past Surgical History:  Procedure Laterality Date  . CATARACT EXTRACTION Left 2014  . CHOLECYSTECTOMY     Family History  Problem Relation Age of Onset  . Heart disease Father   . Hypertension Father   . Heart disease Brother   . Diabetes Brother    Social History   Socioeconomic History  . Marital status: Divorced    Spouse name: Not on file   . Number of children: 0  . Years of education: 9th grade  . Highest education level: Not on file  Occupational History  . Occupation: disabled    Associate Professor: Not Employed  . Occupation: Disabled    Employer: Not Employed  Tobacco Use  . Smoking status: Never Smoker  . Smokeless tobacco: Never Used  Vaping Use  . Vaping Use: Never used  Substance and Sexual Activity  . Alcohol use: No    Alcohol/week: 0.0 standard drinks  . Drug use: No  . Sexual activity: Never  Other Topics Concern  . Not on file  Social History Narrative   Ex-Husband has pschizoaffective disorder, family close by/ lot of support, works in garden   Disabled - mental retardation   Sister [name] is guardian   Social Determinants of Corporate investment banker Strain: Not on [employer] Insecurity: Not on file  Transportation Needs: Not on file  Physical Activity: Not on file  Stress: Not on file  Social Connections: Not on file    Tobacco Counseling Counseling given: Not Answered   Clinical Intake:                 Diabetic: Yes Nutrition Risk Assessment:  Has the patient had any N/V/D within the last 2 months?  {YES/NO:21197} Does the patient have any non-healing wounds?  {YES/NO:21197} Has the patient had any unintentional weight loss or weight gain?  {YES/NO:21197}  Diabetes:  Is the patient diabetic?  Yes  If diabetic, was a CBG obtained today?  No  telephone visit  Did the patient bring in their glucometer from home?  No  telephone visit  How often do you monitor your CBG's? ***.   Financial Strains and Diabetes Management:  Are you having any financial strains with the device, your supplies or your medication? No .  Does the patient want to be seen by Chronic Care Management for management of their diabetes?  No  Would the patient like to be referred to a Nutritionist or for Diabetic Management?  No   Diabetic Exams:  Diabetic Eye Exam: Completed 12/20/2019 Diabetic Foot  Exam: Overdue, Pt has been advised about the importance in completing this exam. Pt is scheduled for diabetic foot exam on 12/03/2020.          Activities of Daily Living No flowsheet data found.  Patient Care Team: Tower, Audrie Gallus, MD as PCP - General Cherlyn Roberts, MD as Consulting Physician (Dermatology)  Indicate any recent Medical Services you may have received from other than [facility] providers in the past year (date may be approximate).     Assessment:   This is a routine wellness examination for Amanpreet.  Hearing/Vision screen No exam data present  Dietary issues and exercise activities discussed:    Goals    . Increase water intake     Starting 09/26/2018, I will  attempt to drink at least 6-8 glasses of water daily.     . Patient Stated     10/07/2019, I will maintain walking daily and continue medications as prescribed.      Depression Screen PHQ 2/9 Scores 10/07/2019 09/26/2018 09/01/2017 08/30/2016 07/08/2015 07/01/2014  PHQ - 2 Score 0 0 0 0 0 0  PHQ- 9 Score 0 0 0 - - -    Fall Risk Fall Risk  10/07/2019 09/26/2018 09/01/2017 08/30/2016 07/08/2015  Falls in the past year? 0 No Yes Yes Yes  Comment - - pt stated she tripped over her own feet - -  Number falls in past yr: 0 - 2 or more 1 1  Injury with Fall? 0 - No No No  Risk for fall due to : Medication side effect;Impaired balance/gait - - - -  Follow up Falls evaluation completed;Falls prevention discussed - - Falls evaluation completed -    FALL RISK PREVENTION PERTAINING TO THE HOME:  Any stairs in or around the home? Yes  If so, are there any without handrails? No  Home free of loose throw rugs in walkways, pet beds, electrical cords, etc? Yes  Adequate lighting in your home to reduce risk of falls? Yes   ASSISTIVE DEVICES UTILIZED TO PREVENT FALLS:  Life alert? {YES/NO:21197} Use of a cane, walker or w/c? {YES/NO:21197} Grab bars in the bathroom? {YES/NO:21197} Shower chair or bench in shower?  {YES/NO:21197} Elevated toilet seat or a handicapped toilet? {YES/NO:21197}  TIMED UP AND GO:  Was the test performed? N/A, telephone visit .    Cognitive Function: MMSE - Mini Mental State Exam 10/07/2019 09/26/2018 09/01/2017 08/30/2016  Orientation to time 5 5 5 3   Orientation to time comments - - - pt was unable to provide correct year despite multiple cues  Orientation to Place 5 5 5 5   Registration 3 3 3 3   Attention/ Calculation 0 0 0 0  Recall 2 3 3 3   Language- name 2 objects - 0 0 0  Language- repeat 0 1 1 1   Language- follow 3 step command - 2 3 3   Language- follow 3 step command-comments - unable to follow 1 step of 3 step command - -  Language- read & follow direction - 0 0 0  Write a sentence - 0 0 0  Copy design - 0 0 0  Total score - 19 20 18   Mini Cog  Mini-Cog screen was completed. Maximum score is 22. A value of 0 denotes this part of the MMSE was not completed or the patient failed this part of the Mini-Cog screening.       Immunizations Immunization History  Administered Date(s) Administered  . Fluad Quad(high Dose 65+) 08/31/2019  . Influenza Split 12/04/2011, 09/28/2012  . Influenza Whole 09/08/2009, 09/08/2010  . Influenza, High Dose Seasonal PF 08/30/2018, 08/31/2019  . Influenza,inj,Quad PF,6+ Mos 08/30/2016, 09/01/2017  . Influenza-Unspecified 10/11/2013, 09/24/2014, 10/21/2015  . PFIZER SARS-COV-2 Vaccination 12/30/2019, 01/20/2020, 09/26/2020  . Pneumococcal Conjugate-13 07/01/2014  . Pneumococcal Polysaccharide-23 12/08/2009  . Td 02/07/2008  . Zoster 04/18/2012    TDAP status: Due, Education has been provided regarding the importance of this vaccine. Advised may receive this vaccine at local pharmacy or Health Dept. Aware to provide a copy of the vaccination record if obtained from local pharmacy or Health Dept. Verbalized acceptance and understanding.  {Flu Vaccine status:2101806}  Pneumococcal vaccine status: Up to date  Covid-19  vaccine status: Completed vaccines  Qualifies for Shingles Vaccine? Yes  Zostavax completed Yes   Shingrix Completed?: No.    Education has been provided regarding the importance of this vaccine. Patient has been advised to call insurance company to determine out of pocket expense if they have not yet received this vaccine. Advised may also receive vaccine at local pharmacy or Health Dept. Verbalized acceptance and understanding.  Screening Tests Health Maintenance  Topic Date Due  . Hepatitis C Screening  Never done  . TETANUS/TDAP  02/06/2018  . FOOT EXAM  09/19/2018  . MAMMOGRAM  03/01/2019  . HEMOGLOBIN A1C  04/06/2020  . INFLUENZA VACCINE  07/05/2020  . OPHTHALMOLOGY EXAM  12/19/2020  . DEXA SCAN  Completed  . COVID-19 Vaccine  Completed  . PNA vac Low Risk Adult  Completed  . PAP SMEAR-Modifier  Discontinued    Health Maintenance  Health Maintenance Due  Topic Date Due  . Hepatitis C Screening  Never done  . TETANUS/TDAP  02/06/2018  . FOOT EXAM  09/19/2018  . MAMMOGRAM  03/01/2019  . HEMOGLOBIN A1C  04/06/2020  . INFLUENZA VACCINE  07/05/2020    Colorectal cancer screening: No longer required.   {Mammogram status:21018020}  {Bone Density status:21018021}  Lung Cancer Screening: (Low Dose CT Chest recommended if Age 14-80 years, 30 pack-year currently smoking OR have quit w/in 15years.) does not qualify.    Additional Screening:  Hepatitis C Screening: does qualify; Completed due  Vision Screening: Recommended annual ophthalmology exams for early detection of glaucoma and other disorders of the eye. Is the patient up to date with their annual eye exam?  Yes  Who is the provider or what is the name of the office in which the patient attends annual eye exams? *** If pt is not established with a provider, would they like to be referred to a provider to establish care? No .   Dental Screening: Recommended annual dental exams for proper oral hygiene  Community  Resource Referral / Chronic Care Management: CRR required this visit?  No   CCM required this visit?  No      Plan:     I have personally reviewed and noted the following in the patient's chart:   . Medical and social history . Use of alcohol, tobacco or illicit drugs  . Current medications and supplements . Functional ability and status . Nutritional status . Physical activity . Advanced directives . List of other physicians . Hospitalizations, surgeries, and ER visits in previous 12 months . Vitals . Screenings to include cognitive, depression, and falls . Referrals and appointments  In addition, I have reviewed and discussed with patient certain preventive protocols, quality metrics, and best practice recommendations. A written personalized care plan for preventive services as well as general preventive health recommendations were provided to patient.   Due to this being a telephonic visit, the after visit summary with patients personalized plan was offered to patient via office or my-chart. Patient preferred to pick up at office at next visit or via mychart.   Andrez Grime, LPN   [id number]

## 2020-12-03 ENCOUNTER — Other Ambulatory Visit: Payer: Self-pay

## 2020-12-03 ENCOUNTER — Encounter: Payer: Self-pay | Admitting: Family Medicine

## 2020-12-03 ENCOUNTER — Ambulatory Visit (INDEPENDENT_AMBULATORY_CARE_PROVIDER_SITE_OTHER): Payer: Medicare HMO | Admitting: Family Medicine

## 2020-12-03 VITALS — BP 118/72 | HR 101 | Temp 97.7°F | Ht 62.5 in | Wt 130.2 lb

## 2020-12-03 DIAGNOSIS — M810 Age-related osteoporosis without current pathological fracture: Secondary | ICD-10-CM

## 2020-12-03 DIAGNOSIS — E039 Hypothyroidism, unspecified: Secondary | ICD-10-CM

## 2020-12-03 DIAGNOSIS — Z0000 Encounter for general adult medical examination without abnormal findings: Secondary | ICD-10-CM | POA: Diagnosis not present

## 2020-12-03 DIAGNOSIS — E785 Hyperlipidemia, unspecified: Secondary | ICD-10-CM | POA: Diagnosis not present

## 2020-12-03 DIAGNOSIS — T466X5A Adverse effect of antihyperlipidemic and antiarteriosclerotic drugs, initial encounter: Secondary | ICD-10-CM | POA: Insufficient documentation

## 2020-12-03 DIAGNOSIS — E1169 Type 2 diabetes mellitus with other specified complication: Secondary | ICD-10-CM | POA: Diagnosis not present

## 2020-12-03 DIAGNOSIS — R69 Illness, unspecified: Secondary | ICD-10-CM | POA: Diagnosis not present

## 2020-12-03 DIAGNOSIS — E119 Type 2 diabetes mellitus without complications: Secondary | ICD-10-CM | POA: Diagnosis not present

## 2020-12-03 DIAGNOSIS — K719 Toxic liver disease, unspecified: Secondary | ICD-10-CM | POA: Diagnosis not present

## 2020-12-03 DIAGNOSIS — E559 Vitamin D deficiency, unspecified: Secondary | ICD-10-CM | POA: Diagnosis not present

## 2020-12-03 DIAGNOSIS — F411 Generalized anxiety disorder: Secondary | ICD-10-CM

## 2020-12-03 DIAGNOSIS — E2839 Other primary ovarian failure: Secondary | ICD-10-CM | POA: Diagnosis not present

## 2020-12-03 MED ORDER — AMLODIPINE BESYLATE 10 MG PO TABS: 10.0000 mg | ORAL_TABLET | Freq: Every day | ORAL | 3 refills | Status: AC

## 2020-12-03 MED ORDER — GEMFIBROZIL 600 MG PO TABS: 600.0000 mg | ORAL_TABLET | Freq: Two times a day (BID) | ORAL | 3 refills | Status: AC

## 2020-12-03 MED ORDER — METFORMIN HCL 500 MG PO TABS: ORAL_TABLET | ORAL | 3 refills | Status: AC

## 2020-12-03 MED ORDER — OMEPRAZOLE 20 MG PO CPDR
DELAYED_RELEASE_CAPSULE | ORAL | 3 refills | Status: AC
Start: 2020-12-03 — End: ?

## 2020-12-03 MED ORDER — ALENDRONATE SODIUM 70 MG PO TABS: ORAL_TABLET | ORAL | 3 refills | Status: AC

## 2020-12-03 MED ORDER — QUINAPRIL HCL 40 MG PO TABS: ORAL_TABLET | ORAL | 3 refills | Status: AC

## 2020-12-03 MED ORDER — CLOTRIMAZOLE 1 % EX CREA: 1.0000 "application " | TOPICAL_CREAM | Freq: Two times a day (BID) | CUTANEOUS | 1 refills | Status: AC

## 2020-12-03 MED ORDER — SERTRALINE HCL 50 MG PO TABS: 50.0000 mg | ORAL_TABLET | Freq: Every day | ORAL | 3 refills | Status: AC

## 2020-12-03 MED ORDER — LEVOTHYROXINE SODIUM 100 MCG PO TABS: ORAL_TABLET | ORAL | 3 refills | Status: AC

## 2020-12-03 MED ORDER — NYSTATIN 100000 UNIT/GM EX POWD: Freq: Two times a day (BID) | CUTANEOUS | 1 refills | Status: AC | PRN

## 2020-12-03 NOTE — Progress Notes (Signed)
Subjective:    Patient ID: Barbara [name], female    DOB: [birth year], 79 y.o.   MRN: [id number]  This visit occurred during the SARS-CoV-2 public health emergency.  Safety protocols were in place, including screening questions prior to the visit, additional usage of staff PPE, and extensive cleaning of exam room while observing appropriate contact time as indicated for disinfecting solutions.    HPI Pt presents for amw and health mt visit   I have personally reviewed the Medicare Annual Wellness questionnaire and have noted 1. The patient's medical and social history 2. Their use of alcohol, tobacco or illicit drugs 3. Their current medications and supplements 4. The patient's functional ability including ADL's, fall risks, home safety risks and hearing or visual             impairment. 5. Diet and physical activities 6. Evidence for depression or mood disorders  The patients weight, height, BMI have been recorded in the chart and visual acuity is per eye clinic.  I have made referrals, counseling and provided education to the patient based review of the above and I have provided the pt with a written personalized care plan for preventive services. Reviewed and updated provider list, see scanned forms.  See scanned forms.  Routine anticipatory guidance given to patient.  See health maintenance. Colon cancer screening  Colonoscopy 3/14 , takes Breast cancer screening  Mammogram 3/19 --needs to schedule her mammogram  Self breast exam-no lumps  Flu vaccine -had this season  covid vaccine-pfizer with booster Tetanus vaccine  Td 3/09  Def for ins Pneumovax-completed  Zoster vaccine zostavax 5/13 Dexa  11/19 - OP Taking alendronate since 11/17 (will be 5 years next nov) Falls-none Fractures-none  Supplements- vit D and ca  D level is 34.92 Exercise - walk , picks up pecans   Advance directive=given materials to work on  Cognitive function addressed- see scanned forms- and  if abnormal then additional documentation follows.   Pt is intellectually impaired  No memory changes  Cognition is stable  Hearing is a problem   PMH and SH reviewed  Meds, vitals, and allergies reviewed.   ROS: See HPI.  Otherwise negative.    Weight : Wt Readings from Last 3 Encounters:  12/03/20 130 lb 3 oz (59.1 kg)  10/07/20 132 lb 12.8 oz (60.2 kg)  10/10/19 132 lb 6 oz (60 kg)   23.43 kg/m   Hearing/vision:  Hearing Screening   125Hz  250Hz  500Hz  1000Hz  2000Hz  3000Hz  4000Hz  6000Hz  8000Hz   Right ear:   0 0 0  0    Left ear:   40 40 40  40    Vision Screening Comments: Eye exam yearly with Dr. Wallace Going, last one July 2021, next appt Jan 2022  Has noticed hearing decrease   Care team Sumiye Hirth-pc[facility] [place]-[facility] [place]- [facility]  HTN bp is stable today  No cp or palpitations or headaches or edema  No side effects to medicines  BP Readings from Last 3 Encounters:  12/03/20 118/72  10/07/20 98/64  10/10/19 132/74    Taking amlodipine 10 mg daily  accupril 20 mg daily  Pulse Readings from Last 3 Encounters:  12/03/20 (!) 101  10/07/20 100  10/10/19 75     Hypothyroidism  Pt has no clinical changes No change in energy level/ hair or skin/ edema and no tremor Lab Results  Component Value Date   TSH 2.84 12/01/2020    Takes levothyroxine 100 mcg daily   DM2 Lab  Results  Component Value Date   HGBA1C 5.4 12/01/2020  glucose was 115  Eye exam due next month  Takes metformin 500 mg daily  On ace  Not on statin (takes gemfibrozil)   Hyperlipidemia Lab Results  Component Value Date   CHOL 194 12/01/2020   CHOL 198 10/08/2019   CHOL 215 (H) 04/01/2019   Lab Results  Component Value Date   HDL 44.00 12/01/2020   HDL 37.90 (L) 10/08/2019   HDL 33.10 (L) 04/01/2019   Lab Results  Component Value Date   LDLCALC 123 (H) 12/01/2020   LDLCALC 133 (H) 10/08/2019   LDLCALC 134 (H) 09/01/2017   Lab Results  Component Value Date   TRIG 134.0  12/01/2020   TRIG 134.0 10/08/2019   TRIG 234.0 (H) 04/01/2019   Lab Results  Component Value Date   CHOLHDL 4 12/01/2020   CHOLHDL 5 10/08/2019   CHOLHDL 7 04/01/2019   Lab Results  Component Value Date   LDLDIRECT 123.0 04/01/2019   LDLDIRECT 115.0 03/15/2018   LDLDIRECT 113.0 07/27/2016     Takes gemfibrozil  600 mg bid  No statin since past caused inc in liver enzymes   gen anx disorder Takes sertraline 50 mg   Lab Results  Component Value Date   WBC 7.1 12/01/2020   HGB 12.1 12/01/2020   HCT 35.6 (L) 12/01/2020   MCV 90.0 12/01/2020   PLT 300.0 12/01/2020  pt takes iron  Lab Results  Component Value Date   CREATININE 0.80 12/01/2020   BUN 23 12/01/2020   NA 139 12/01/2020   K 4.5 12/01/2020   CL 104 12/01/2020   CO2 24 12/01/2020   Lab Results  Component Value Date   ALT 7 12/01/2020   AST 13 12/01/2020   ALKPHOS 52 12/01/2020   BILITOT 0.4 12/01/2020     She does not drink enough water   Still itches around genitals/thighs Triamcinolone helped just a little   Patient Active Problem List   Diagnosis Date Noted  . Hepatotoxicity due to statin drug 12/03/2020  . Intertrigo 10/03/2018  . Vitamin D deficiency 03/20/2018  . Osteoporosis 09/25/2016  . Encounter for screening mammogram for breast cancer 09/13/2016  . Estrogen deficiency 09/13/2016  . History of shingles 07/27/2016  . Routine general medical examination at a health care facility 07/08/2015  . Vaginal atrophy 07/01/2013  . Encounter for Medicare annual wellness exam 07/01/2013  . Colon cancer screening 06/26/2012  . Hypothyroid 06/18/2012  . Encounter for routine gynecological examination 02/15/2012  . Controlled type 2 diabetes mellitus without complication, without long-term current use of insulin (HCC) 06/05/2007  . Hyperlipidemia associated with type 2 diabetes mellitus (HCC) 06/05/2007  . Generalized anxiety disorder 06/05/2007  . EXPRESSIVE LANGUAGE DISORDER 06/05/2007  .  ISCHEMIA, RETINAL 06/05/2007  . Essential hypertension 06/05/2007  . ALLERGIC RHINITIS 06/05/2007   Past Medical History:  Diagnosis Date  . Allergy   . Anemia   . Anxiety   . Diabetes mellitus   . Expressive language disorder   . GERD (gastroesophageal reflux disease)   . Hyperlipidemia   . Hypertension   . Learning disability   . Mentally disabled   . Obesity   . Retinal ischemia    history of   Past Surgical History:  Procedure Laterality Date  . CATARACT EXTRACTION Left 2014  . CHOLECYSTECTOMY     Social History   Tobacco Use  . Smoking status: Never Smoker  . Smokeless tobacco: Never Used  Vaping  Use  . Vaping Use: Never used  Substance Use Topics  . Alcohol use: No    Alcohol/week: 0.0 standard drinks  . Drug use: No   Family History  Problem Relation Age of Onset  . Heart disease Father   . Hypertension Father   . Heart disease Brother   . Diabetes Brother    Allergies  Allergen Reactions  . Atorvastatin     REACTION: increased liver fxn tests  . Ezetimibe     REACTION: stomach upset/malaise  . Sulfonamide Derivatives     REACTION: rash   Current Outpatient Medications on File Prior to Visit  Medication Sig Dispense Refill  . acetaminophen (TYLENOL) 500 MG tablet Take 500 mg by mouth every 6 (six) hours as needed.    . Calcium Carb-Cholecalciferol (CALCIUM + VITAMIN D3 PO) Take 1 tablet by mouth daily.    . ferrous sulfate 325 (65 FE) MG tablet Take 1 tablet (325 mg total) by mouth daily with breakfast.  3  . fexofenadine (ALLEGRA) 180 MG tablet Take 180 mg by mouth as needed.    . fluticasone (FLONASE) 50 MCG/ACT nasal spray USE TWO SPRAYS IN EACH NOSTRIL DAILY 48 g 3  . triamcinolone cream (KENALOG) 0.1 % Apply 1 application topically 2 (two) times daily. 30 g 0  . vitamin B-12 (CYANOCOBALAMIN) 1000 MCG tablet Take 1 tablet (1,000 mcg total) by mouth daily.     No current facility-administered medications on file prior to visit.    Review of  Systems  Constitutional: Negative for activity change, appetite change, fatigue, fever and unexpected weight change.  HENT: Positive for hearing loss. Negative for congestion, ear pain, rhinorrhea, sinus pressure and sore throat.   Eyes: Negative for pain, redness and visual disturbance.  Respiratory: Negative for cough, shortness of breath and wheezing.   Cardiovascular: Negative for chest pain and palpitations.  Gastrointestinal: Negative for abdominal pain, blood in stool, constipation and diarrhea.  Endocrine: Negative for polydipsia and polyuria.  Genitourinary: Negative for dysuria, frequency and urgency.  Musculoskeletal: Negative for arthralgias, back pain and myalgias.  Skin: Negative for pallor and rash.  Allergic/Immunologic: Negative for environmental allergies.  Neurological: Negative for dizziness, syncope and headaches.  Hematological: Negative for adenopathy. Does not bruise/bleed easily.  Psychiatric/Behavioral: Negative for decreased concentration and dysphoric mood. The patient is not nervous/anxious.        Objective:   Physical Exam Constitutional:      General: She is not in acute distress.    Appearance: Normal appearance. She is well-developed and normal weight. She is not ill-appearing or diaphoretic.  HENT:     Head: Normocephalic and atraumatic.     Right Ear: Tympanic membrane, ear canal and external ear normal.     Left Ear: Tympanic membrane, ear canal and external ear normal.     Nose: Nose normal. No congestion.     Mouth/Throat:     Mouth: Mucous membranes are moist.     Pharynx: Oropharynx is clear. No posterior oropharyngeal erythema.  Eyes:     General: No scleral icterus.    Extraocular Movements: Extraocular movements intact.     Conjunctiva/sclera: Conjunctivae normal.     Pupils: Pupils are equal, round, and reactive to light.     Comments: Baseline loss of vision and RR in R eye   Neck:     Thyroid: No thyromegaly.     Vascular: No  carotid bruit or JVD.  Cardiovascular:     Rate and Rhythm: Regular  rhythm. Tachycardia present.     Pulses: Normal pulses.     Heart sounds: Normal heart sounds. No gallop.   Pulmonary:     Effort: Pulmonary effort is normal. No respiratory distress.     Breath sounds: Normal breath sounds. No wheezing.     Comments: Good air exch Chest:     Chest wall: No tenderness.  Abdominal:     General: Bowel sounds are normal. There is no distension or abdominal bruit.     Palpations: Abdomen is soft. There is no mass.     Tenderness: There is no abdominal tenderness.     Hernia: No hernia is present.  Genitourinary:    Comments: Breast exam: No mass, nodules, thickening, tenderness, bulging, retraction, inflamation, nipple discharge or skin changes noted.  No axillary or clavicular LA.     Musculoskeletal:        General: No tenderness. Normal range of motion.     Cervical back: Normal range of motion and neck supple. No rigidity. No muscular tenderness.     Right lower leg: No edema.     Left lower leg: No edema.     Comments: No kyphosis   Lymphadenopathy:     Cervical: No cervical adenopathy.  Skin:    General: Skin is warm and dry.     Coloration: Skin is not pale.     Findings: No erythema or rash.     Comments: Solar lentigines diffusely   Neurological:     Mental Status: She is alert. Mental status is at baseline.     Cranial Nerves: No cranial nerve deficit.     Motor: No abnormal muscle tone.     Coordination: Coordination normal.     Gait: Gait normal.     Deep Tendon Reflexes: Reflexes are normal and symmetric. Reflexes normal.  Psychiatric:        Mood and Affect: Mood normal.        Cognition and Memory: Cognition and memory normal.           Assessment & Plan:   Problem List Items Addressed This Visit      Digestive   Hepatotoxicity due to statin drug    Pt cannot take statin medication         Endocrine   Controlled type 2 diabetes mellitus without  complication, without long-term current use of insulin (HCC)    Very well controlled Lab Results  Component Value Date   HGBA1C 5.4 12/01/2020   Taking metformin 500 mg daily  On ace Unable to take statins  Plans eye exam next month         Relevant Medications   metFORMIN (GLUCOPHAGE) 500 MG tablet   quinapril (ACCUPRIL) 40 MG tablet   Hyperlipidemia associated with type 2 diabetes mellitus (HCC)    Disc goals for lipids and reasons to control them Rev last labs with pt Rev low sat fat diet in detail Cannot take statin due to past liver effects  On gemfibrozil for high triglycerides -very well controlled        Relevant Medications   gemfibrozil (LOPID) 600 MG tablet   metFORMIN (GLUCOPHAGE) 500 MG tablet   quinapril (ACCUPRIL) 40 MG tablet   Hypothyroid    Hypothyroidism  Pt has no clinical changes No change in energy level/ hair or skin/ edema and no tremor Lab Results  Component Value Date   TSH 2.84 12/01/2020    Plan to continue levothyroxine 100 mcg daily  Relevant Medications   levothyroxine (SYNTHROID) 100 MCG tablet     Musculoskeletal and Integument   Osteoporosis    dexa ordered  Rev one from 11/19  No falls or fx  Taking ca and D with therapeutic D level Will finish 5 y alendronate course 11/22      Relevant Medications   alendronate (FOSAMAX) 70 MG tablet     Other   Generalized anxiety disorder    Mood is good per pt and caregiver Plan to continue zoloft 50 mg daily        Relevant Medications   sertraline (ZOLOFT) 50 MG tablet   Encounter for Medicare annual wellness exam - Primary    Reviewed health habits including diet and exercise and skin cancer prevention Reviewed appropriate screening tests for age  Also reviewed health mt list, fam hx and immunization status , as well as social and family history   See HPI Labs reviewed  Given # to schedule dexa and mammogram  covid vaccinated with booster  Urged to consider shingrix  if affordable  Given materials to work on Insurance underwriter  No cognitive concerns  Poor hearing in R ear-pt declines audiology eval currently  utd eye/vision care (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)  Encouraged better water intake      Routine general medical examination at a health care facility    Reviewed health habits including diet and exercise and skin cancer prevention Reviewed appropriate screening tests for age  Also reviewed health mt list, fam hx and immunization status , as well as social and family history   See HPI Labs reviewed  Given # to schedule dexa and mammogram  covid vaccinated with booster  Urged to consider shingrix if affordable  Given materials to work on Insurance underwriter  No cognitive concerns  Poor hearing in R ear-pt declines audiology eval currently  utd eye/vision care (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

## 2020-12-03 NOTE — Patient Instructions (Addendum)
Please call the breast center to schedule mammogram and bone density test   Work on the advance directive and get it notarized and get a copy to [place]   Try the lotrimin cream on itchy areas around genitals to see if it helps Keep areas clean and dry

## 2020-12-05 NOTE — Assessment & Plan Note (Signed)
Hypothyroidism  Pt has no clinical changes No change in energy level/ hair or skin/ edema and no tremor Lab Results  Component Value Date   TSH 2.84 12/01/2020    Plan to continue levothyroxine 100 mcg daily

## 2020-12-05 NOTE — Assessment & Plan Note (Signed)
Mood is good per pt and caregiver Plan to continue zoloft 50 mg daily

## 2020-12-05 NOTE — Assessment & Plan Note (Addendum)
dexa ordered  Rev one from 11/19  No falls or fx  Taking ca and D with therapeutic D level Will finish 5 y alendronate course 11/22

## 2020-12-05 NOTE — Assessment & Plan Note (Signed)
Very well controlled Lab Results  Component Value Date   HGBA1C 5.4 12/01/2020   Taking metformin 500 mg daily  On ace Unable to take statins  Plans eye exam next month

## 2020-12-05 NOTE — Assessment & Plan Note (Signed)
Vitamin D level is therapeutic with current supplementation Disc importance of this to bone and overall health Level 34.9

## 2020-12-05 NOTE — Assessment & Plan Note (Signed)
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

## 2020-12-05 NOTE — Assessment & Plan Note (Signed)
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 

## 2020-12-05 NOTE — Assessment & Plan Note (Signed)
Disc goals for lipids and reasons to control them Rev last labs with pt Rev low sat fat diet in detail Cannot take statin due to past liver effects  On gemfibrozil for high triglycerides -very well controlled

## 2020-12-05 NOTE — Assessment & Plan Note (Signed)
Pt cannot take statin medication

## 2021-01-21 LAB — HM DIABETES EYE EXAM

## 2021-01-29 DIAGNOSIS — E119 Type 2 diabetes mellitus without complications: Secondary | ICD-10-CM | POA: Diagnosis not present

## 2021-02-20 ENCOUNTER — Other Ambulatory Visit: Payer: Self-pay | Admitting: Internal Medicine

## 2021-04-26 ENCOUNTER — Telehealth: Payer: Self-pay

## 2021-04-26 NOTE — Telephone Encounter (Signed)
[facility] - Client TELEPHONE ADVICE RECORD [facility] Patient Name: Barbara [name] Gender: Female DOB: May 22, 1941 Age: 80 Y 2 M 12 D Return Phone Number: [phone] (Primary) Address[place] [address] Client [facility] - Client Client Site [facility], Roque Lias - MD Contact Type Call Who Is Calling Patient / Member / Family / Caregiver Call Type Triage / Clinical Caller Name Elma Relationship To Patient Sibling Return Phone Number [phone] (Primary) Chief Complaint Dizziness Reason for Call Symptomatic / Request for [place] states, sister sick for a week. Head swimming and spitting up mucus Translation No No Triage Reason Other Nurse Assessment Nurse: Clovis Riley, RN, Georgina Peer Date/Time (Eastern Time): 04/26/2021 10:40:34 AM Confirm and document reason for call. If symptomatic, describe symptoms. ---Caller states her sister has been sick for a week. States she is having congestion when she coughs and spits up yellow mucous. Head swimming and spitting up mucus Does the patient have any new or worsening symptoms? ---Yes Will a triage be completed? ---No Select reason for no triage. ---Other Please document clinical information provided and list any resource used. ---States she is not with her sister at present but will call back Disp. Time Eilene Ghazi Time) Disposition Final User 04/26/2021 10:43:44 AM Clinical Call Yes Clovis Riley, RN, Georgina Peer

## 2021-04-26 NOTE — Telephone Encounter (Signed)
I spoke with Dedra Skeens (DPR signed) for about one wk pt has prod cough with yellow phlegm; no wheezing and no SOB and pt said her "head felt funny" when pt touches her head the head feels sore. No fever but has not cked temp. Alma said she is going to ck on pt in a little while and will call [place] back if any concerns for today. Will send fyi to Dr Glori Bickers.

## 2021-04-26 NOTE — Telephone Encounter (Signed)
Please schedule virtual visit with first available or me later in week

## 2021-04-26 NOTE — Telephone Encounter (Signed)
Family called back and scheduled a virtual visit with PCP for 04/28/21

## 2021-04-28 ENCOUNTER — Other Ambulatory Visit: Payer: Self-pay | Admitting: Family Medicine

## 2021-04-28 ENCOUNTER — Other Ambulatory Visit: Payer: Self-pay

## 2021-04-28 ENCOUNTER — Encounter: Payer: Self-pay | Admitting: Family Medicine

## 2021-04-28 ENCOUNTER — Other Ambulatory Visit: Payer: Medicare Other

## 2021-04-28 ENCOUNTER — Telehealth (INDEPENDENT_AMBULATORY_CARE_PROVIDER_SITE_OTHER): Payer: Medicare Other | Admitting: Family Medicine

## 2021-04-28 ENCOUNTER — Telehealth: Payer: Medicare Other | Admitting: Family Medicine

## 2021-04-28 DIAGNOSIS — J069 Acute upper respiratory infection, unspecified: Secondary | ICD-10-CM

## 2021-04-28 DIAGNOSIS — J019 Acute sinusitis, unspecified: Secondary | ICD-10-CM | POA: Insufficient documentation

## 2021-04-28 DIAGNOSIS — J0110 Acute frontal sinusitis, unspecified: Secondary | ICD-10-CM

## 2021-04-28 MED ORDER — AMOXICILLIN-POT CLAVULANATE 875-125 MG PO TABS: 1.0000 | ORAL_TABLET | Freq: Two times a day (BID) | ORAL | 0 refills | Status: DC

## 2021-04-28 NOTE — Progress Notes (Signed)
Virtual Visit via Video Note  I connected with Barbara [name] on 04/28/21 at  9:30 AM EDT by a video enabled telemedicine application and verified that I am speaking with the correct person using two identifiers.  Location: Patient: home Provider: office   I discussed the limitations of evaluation and management by telemedicine and the availability of in person appointments. The patient expressed understanding and agreed to proceed.  Parties involved in encounter  Patient: Barbara [name] Sister: Arty Baumgartner  Provider:  Loura Pardon MD    This visit occurred during the SARS-CoV-2 public health emergency.  Safety protocols were in place, including screening questions prior to the visit, additional usage of staff PPE, and extensive cleaning of exam room while observing appropriate contact time as indicated for disinfecting solutions.   Video failed today so visit was conducted by phone  History of Present Illness: Pt presents for c/o cough and congestion with some funny feeling in head  Symptoms started over a week ago  Has not done a covid test at home   Cough - hurts a bit to cough  Mucous is yellow  No wheeze or sob   Runny /stuffy nose is better now  Some pain in her face  Above her eyes  Some green /yellow mucous   No fever  Headache on /off  No chills or aches   Does not feel that bad   No n/v Had a little diarrhea  Needs to drink more fluids (per caregiver)  She is covid immunized with booster times 2   Allegra flonase  On ace for HTN   Cough med otc: robitussin DM  Taking tylenol   Patient Active Problem List   Diagnosis Date Noted  . Acute sinusitis 04/28/2021  . Viral URI with cough 04/28/2021  . Hepatotoxicity due to statin drug 12/03/2020  . Intertrigo 10/03/2018  . Vitamin D deficiency 03/20/2018  . Osteoporosis 09/25/2016  . Encounter for screening mammogram for breast cancer 09/13/2016  . Estrogen deficiency 09/13/2016  . History of  shingles 07/27/2016  . Routine general medical examination at a health care facility 07/08/2015  . Vaginal atrophy 07/01/2013  . Encounter for Medicare annual wellness exam 07/01/2013  . Colon cancer screening 06/26/2012  . Hypothyroid 06/18/2012  . Encounter for routine gynecological examination 02/15/2012  . Controlled type 2 diabetes mellitus without complication, without long-term current use of insulin ([place]) 06/05/2007  . Hyperlipidemia associated with type 2 diabetes mellitus ([place]) 06/05/2007  . Generalized anxiety disorder 06/05/2007  . EXPRESSIVE LANGUAGE DISORDER 06/05/2007  . ISCHEMIA, RETINAL 06/05/2007  . Essential hypertension 06/05/2007  . ALLERGIC RHINITIS 06/05/2007   Past Medical History:  Diagnosis Date  . Allergy   . Anemia   . Anxiety   . Diabetes mellitus   . Expressive language disorder   . GERD (gastroesophageal reflux disease)   . Hyperlipidemia   . Hypertension   . Learning disability   . Mentally disabled   . Obesity   . Retinal ischemia    history of   Past Surgical History:  Procedure Laterality Date  . CATARACT EXTRACTION Left 2014  . CHOLECYSTECTOMY     Social History   Tobacco Use  . Smoking status: Never Smoker  . Smokeless tobacco: Never Used  Vaping Use  . Vaping Use: Never used  Substance Use Topics  . Alcohol use: No    Alcohol/week: 0.0 standard drinks  . Drug use: No   Family History  Problem Relation Age of Onset  .  Heart disease Father   . Hypertension Father   . Heart disease Brother   . Diabetes Brother    Allergies  Allergen Reactions  . Atorvastatin     REACTION: increased liver fxn tests  . Ezetimibe     REACTION: stomach upset/malaise  . Sulfonamide Derivatives     REACTION: rash   Current Outpatient Medications on File Prior to Visit  Medication Sig Dispense Refill  . acetaminophen (TYLENOL) 500 MG tablet Take 500 mg by mouth every 6 (six) hours as needed.    Marland Kitchen alendronate (FOSAMAX) 70 MG tablet TAKE 1  TABLET BY MOUTH 30 MINUTES BEFOREBREAKFAST ONCE A WEEK WITH ATLEAST 8 OZ. OF WATER. 12 tablet 3  . amLODipine (NORVASC) 10 MG tablet Take 1 tablet (10 mg total) by mouth daily. 90 tablet 3  . amoxicillin-clavulanate (AUGMENTIN) 875-125 MG tablet Take 1 tablet by mouth 2 (two) times daily. 14 tablet 0  . Calcium Carb-Cholecalciferol (CALCIUM + VITAMIN D3 PO) Take 1 tablet by mouth daily.    . clotrimazole (LOTRIMIN) 1 % cream Apply 1 application topically 2 (two) times daily. To itchy genital areas 30 g 1  . ferrous sulfate 325 (65 FE) MG tablet Take 1 tablet (325 mg total) by mouth daily with breakfast.  3  . fexofenadine (ALLEGRA) 180 MG tablet Take 180 mg by mouth as needed.    . fluticasone (FLONASE) 50 MCG/ACT nasal spray USE TWO SPRAYS IN EACH NOSTRIL DAILY 48 g 3  . gemfibrozil (LOPID) 600 MG tablet Take 1 tablet (600 mg total) by mouth 2 (two) times daily. 180 tablet 3  . levothyroxine (SYNTHROID) 100 MCG tablet TAKE 1 TABLET BY MOUTH DAILY BEFORE BREAKFAST. 90 tablet 3  . metFORMIN (GLUCOPHAGE) 500 MG tablet TAKE 1 TABLET BY MOUTH EVERY DAY WITH BREAKFAST 90 tablet 3  . nystatin (MYCOSTATIN/NYSTOP) powder Apply topically 2 (two) times daily as needed. To itchy rash under breasts 30 g 1  . omeprazole (PRILOSEC) 20 MG capsule TAKE ONE CAPSULE BY MOUTH DAILY BEFORE BREAKFAST 90 capsule 3  . quinapril (ACCUPRIL) 40 MG tablet TAKE ONE-HALF (0.5) BY MOUTH DAILY 45 tablet 3  . sertraline (ZOLOFT) 50 MG tablet Take 1 tablet (50 mg total) by mouth daily. 90 tablet 3  . triamcinolone cream (KENALOG) 0.1 % Apply 1 application topically 2 (two) times daily. 30 g 0  . vitamin B-12 (CYANOCOBALAMIN) 1000 MCG tablet Take 1 tablet (1,000 mcg total) by mouth daily.     No current facility-administered medications on file prior to visit.    Review of Systems  Constitutional: Negative for chills, fever and malaise/fatigue.  HENT: Positive for congestion and sinus pain. Negative for ear pain and sore  throat.   Eyes: Negative for blurred vision, discharge and redness.  Respiratory: Positive for cough and sputum production. Negative for shortness of breath, wheezing and stridor.   Cardiovascular: Negative for chest pain, palpitations and leg swelling.  Gastrointestinal: Negative for abdominal pain, diarrhea, nausea and vomiting.  Musculoskeletal: Negative for myalgias.  Skin: Negative for rash.  Neurological: Positive for headaches. Negative for dizziness.    Observations/Objective: Pt sounds well, in no distress Baseline speech impediment noted  Mildly hoarse voice  Few dry sounding coughs  No wheeze or audible sob on conversation  Does clear throat Baseline cognition  Nl mood-cheerful  Assessment and Plan: Problem List Items Addressed This Visit      Respiratory   Acute sinusitis    With more than 8 d of uri  symptoms and now headache/facial fullness and prod cough with yellow sputum  tx with augmentin For the viral component- covid test was ordered and will be done this afternoon  Update if not starting to improve in a week or if worsening  Will isolate until test result  covid immunized with 2 boosters Will continue allegra and flonase and robitussin DM prn  ER precautions discussed      Viral URI with cough    covid test ordered          Follow Up Instructions: Drink fluid and rest  Continue current medicines  Take the augmentin for a sinus infection  Update  if not starting to improve in a week or if worsening   We will call to set up covid testing today  Update if not starting to improve in a week or if worsening   If any severe symptoms please go to the ER   I discussed the assessment and treatment plan with the patient. The patient was provided an opportunity to ask questions and all were answered. The patient agreed with the plan and demonstrated an understanding of the instructions.   The patient was advised to call back or seek an in-person evaluation  if the symptoms worsen or if the condition fails to improve as anticipated.  I provided 17 minutes of non-face-to-face time during this encounter.   Loura Pardon, MD

## 2021-04-28 NOTE — Assessment & Plan Note (Signed)
covid test ordered

## 2021-04-28 NOTE — Patient Instructions (Signed)
Drink fluid and rest  Continue current medicines  Take the augmentin for a sinus infection  Update  if not starting to improve in a week or if worsening   We will call to set up covid testing today  Update if not starting to improve in a week or if worsening   If any severe symptoms please go to the ER

## 2021-04-28 NOTE — Progress Notes (Deleted)
Virtual Visit via Video Note  I connected with Barbara [name] on 04/28/21 at  9:30 AM EDT by a video enabled telemedicine application and verified that I am speaking with the correct person using two identifiers.  Location: Patient: *** Provider: ***   I discussed the limitations of evaluation and management by telemedicine and the availability of in person appointments. The patient expressed understanding and agreed to proceed.  Parties involved in encounter  Patient:  Provider:  Loura Pardon MD    This visit occurred during the SARS-CoV-2 public health emergency.  Safety protocols were in place, including screening questions prior to the visit, additional usage of staff PPE, and extensive cleaning of exam room while observing appropriate contact time as indicated for disinfecting solutions.   Video failed today so visit was conducted by phone  History of Present Illness: Pt presents for c/o cough and congestion with some funny feeling in head  Symptoms started over a week ago  Has not done a covid test at home   Cough - hurts a bit to cough  Mucous is yellow  No wheeze or sob   Runny /stuffy nose is better now  Some pain in her face  Above her eyes  Some green /yellow mucous   No fever  Headache on /off  No chills or aches   Does not feel that bad    No n/v Had a little diarrhea  Needs to drink more fluids (per caregiver)     She is covid immunized with booster times 2   Allegra flonase  On ace for HTN   Cough med otc: robitussin DM  Taking tylenol    Observations/Objective:   Assessment and Plan:   Follow Up Instructions:    I discussed the assessment and treatment plan with the patient. The patient was provided an opportunity to ask questions and all were answered. The patient agreed with the plan and demonstrated an understanding of the instructions.   The patient was advised to call back or seek an in-person evaluation if the symptoms  worsen or if the condition fails to improve as anticipated.  I provided *** minutes of non-face-to-face time during this encounter.   Loura Pardon, MD

## 2021-04-28 NOTE — Assessment & Plan Note (Signed)
With more than 8 d of uri symptoms and now headache/facial fullness and prod cough with yellow sputum  tx with augmentin For the viral component- covid test was ordered and will be done this afternoon  Update if not starting to improve in a week or if worsening  Will isolate until test result  covid immunized with 2 boosters Will continue allegra and flonase and robitussin DM prn  ER precautions discussed

## 2021-04-30 ENCOUNTER — Telehealth: Payer: Medicare Other | Admitting: Family Medicine

## 2021-04-30 LAB — NOVEL CORONAVIRUS, NAA

## 2021-05-03 NOTE — Progress Notes (Signed)
No whow

## 2021-05-04 ENCOUNTER — Encounter (HOSPITAL_COMMUNITY): Payer: Self-pay | Admitting: Medical Oncology

## 2021-05-04 ENCOUNTER — Emergency Department: Payer: Medicare HMO

## 2021-05-04 ENCOUNTER — Other Ambulatory Visit: Payer: Self-pay

## 2021-05-04 ENCOUNTER — Ambulatory Visit (HOSPITAL_COMMUNITY)
Admission: EM | Admit: 2021-05-04 | Discharge: 2021-05-04 | Disposition: A | Discharge status: Home / Self Care | Payer: Medicare Other

## 2021-05-04 ENCOUNTER — Inpatient Hospital Stay
Admission: EM | Admit: 2021-05-04 | Discharge: 2021-05-09 | DRG: 291 | Disposition: A | Discharge status: Home / Self Care | Payer: Medicare HMO | Attending: Internal Medicine | Admitting: Internal Medicine

## 2021-05-04 ENCOUNTER — Telehealth: Payer: Self-pay

## 2021-05-04 DIAGNOSIS — I509 Heart failure, unspecified: Secondary | ICD-10-CM

## 2021-05-04 DIAGNOSIS — Z79890 Hormone replacement therapy (postmenopausal): Secondary | ICD-10-CM | POA: Diagnosis not present

## 2021-05-04 DIAGNOSIS — I5031 Acute diastolic (congestive) heart failure: Secondary | ICD-10-CM | POA: Diagnosis present

## 2021-05-04 DIAGNOSIS — R0600 Dyspnea, unspecified: Secondary | ICD-10-CM | POA: Diagnosis not present

## 2021-05-04 DIAGNOSIS — E1165 Type 2 diabetes mellitus with hyperglycemia: Secondary | ICD-10-CM | POA: Diagnosis not present

## 2021-05-04 DIAGNOSIS — F32A Depression, unspecified: Secondary | ICD-10-CM | POA: Diagnosis not present

## 2021-05-04 DIAGNOSIS — E785 Hyperlipidemia, unspecified: Secondary | ICD-10-CM | POA: Diagnosis present

## 2021-05-04 DIAGNOSIS — Z8249 Family history of ischemic heart disease and other diseases of the circulatory system: Secondary | ICD-10-CM | POA: Diagnosis not present

## 2021-05-04 DIAGNOSIS — K449 Diaphragmatic hernia without obstruction or gangrene: Secondary | ICD-10-CM | POA: Diagnosis not present

## 2021-05-04 DIAGNOSIS — N1832 Chronic kidney disease, stage 3b: Secondary | ICD-10-CM | POA: Diagnosis not present

## 2021-05-04 DIAGNOSIS — J811 Chronic pulmonary edema: Secondary | ICD-10-CM | POA: Diagnosis not present

## 2021-05-04 DIAGNOSIS — I2510 Atherosclerotic heart disease of native coronary artery without angina pectoris: Secondary | ICD-10-CM | POA: Diagnosis not present

## 2021-05-04 DIAGNOSIS — E039 Hypothyroidism, unspecified: Secondary | ICD-10-CM | POA: Diagnosis present

## 2021-05-04 DIAGNOSIS — Z7983 Long term (current) use of bisphosphonates: Secondary | ICD-10-CM | POA: Diagnosis not present

## 2021-05-04 DIAGNOSIS — Z833 Family history of diabetes mellitus: Secondary | ICD-10-CM

## 2021-05-04 DIAGNOSIS — Z7984 Long term (current) use of oral hypoglycemic drugs: Secondary | ICD-10-CM

## 2021-05-04 DIAGNOSIS — N179 Acute kidney failure, unspecified: Secondary | ICD-10-CM | POA: Diagnosis present

## 2021-05-04 DIAGNOSIS — J479 Bronchiectasis, uncomplicated: Secondary | ICD-10-CM | POA: Diagnosis not present

## 2021-05-04 DIAGNOSIS — J9601 Acute respiratory failure with hypoxia: Secondary | ICD-10-CM | POA: Diagnosis not present

## 2021-05-04 DIAGNOSIS — K219 Gastro-esophageal reflux disease without esophagitis: Secondary | ICD-10-CM | POA: Diagnosis present

## 2021-05-04 DIAGNOSIS — F419 Anxiety disorder, unspecified: Secondary | ICD-10-CM | POA: Diagnosis present

## 2021-05-04 DIAGNOSIS — I110 Hypertensive heart disease with heart failure: Secondary | ICD-10-CM | POA: Diagnosis not present

## 2021-05-04 DIAGNOSIS — R0602 Shortness of breath: Secondary | ICD-10-CM | POA: Diagnosis not present

## 2021-05-04 DIAGNOSIS — Z882 Allergy status to sulfonamides status: Secondary | ICD-10-CM | POA: Diagnosis not present

## 2021-05-04 DIAGNOSIS — E1159 Type 2 diabetes mellitus with other circulatory complications: Secondary | ICD-10-CM | POA: Diagnosis present

## 2021-05-04 DIAGNOSIS — J189 Pneumonia, unspecified organism: Secondary | ICD-10-CM | POA: Diagnosis not present

## 2021-05-04 DIAGNOSIS — Z888 Allergy status to other drugs, medicaments and biological substances status: Secondary | ICD-10-CM

## 2021-05-04 DIAGNOSIS — J9690 Respiratory failure, unspecified, unspecified whether with hypoxia or hypercapnia: Secondary | ICD-10-CM | POA: Diagnosis not present

## 2021-05-04 DIAGNOSIS — Z79899 Other long term (current) drug therapy: Secondary | ICD-10-CM | POA: Diagnosis not present

## 2021-05-04 DIAGNOSIS — E11649 Type 2 diabetes mellitus with hypoglycemia without coma: Secondary | ICD-10-CM | POA: Diagnosis not present

## 2021-05-04 DIAGNOSIS — I248 Other forms of acute ischemic heart disease: Secondary | ICD-10-CM | POA: Diagnosis present

## 2021-05-04 DIAGNOSIS — E119 Type 2 diabetes mellitus without complications: Secondary | ICD-10-CM

## 2021-05-04 DIAGNOSIS — Z9049 Acquired absence of other specified parts of digestive tract: Secondary | ICD-10-CM

## 2021-05-04 DIAGNOSIS — I517 Cardiomegaly: Secondary | ICD-10-CM | POA: Diagnosis not present

## 2021-05-04 DIAGNOSIS — R778 Other specified abnormalities of plasma proteins: Secondary | ICD-10-CM

## 2021-05-04 DIAGNOSIS — J849 Interstitial pulmonary disease, unspecified: Secondary | ICD-10-CM | POA: Diagnosis present

## 2021-05-04 DIAGNOSIS — I152 Hypertension secondary to endocrine disorders: Secondary | ICD-10-CM | POA: Diagnosis present

## 2021-05-04 DIAGNOSIS — Z9842 Cataract extraction status, left eye: Secondary | ICD-10-CM

## 2021-05-04 DIAGNOSIS — Z20822 Contact with and (suspected) exposure to covid-19: Secondary | ICD-10-CM | POA: Diagnosis present

## 2021-05-04 DIAGNOSIS — T380X5A Adverse effect of glucocorticoids and synthetic analogues, initial encounter: Secondary | ICD-10-CM | POA: Diagnosis not present

## 2021-05-04 LAB — BASIC METABOLIC PANEL
Anion gap: 15 (ref 5–15)
BUN: 16 mg/dL (ref 8–23)
CO2: 20 mmol/L — ABNORMAL LOW (ref 22–32)
Calcium: 9.9 mg/dL (ref 8.9–10.3)
Chloride: 100 mmol/L (ref 98–111)
Creatinine, Ser: 0.76 mg/dL (ref 0.44–1.00)
GFR, Estimated: >60 mL/min (ref >60)
Glucose, Bld: 124 mg/dL — ABNORMAL HIGH (ref 70–99)
Potassium: 4.3 mmol/L (ref 3.5–5.1)
Sodium: 135 mmol/L (ref 135–145)

## 2021-05-04 LAB — CBC
HCT: 32 % — ABNORMAL LOW (ref 36.0–46.0)
Hemoglobin: 11 g/dL — ABNORMAL LOW (ref 12.0–15.0)
MCH: 29.3 pg (ref 26.0–34.0)
MCHC: 34.4 g/dL (ref 30.0–36.0)
MCV: 85.3 fL (ref 80.0–100.0)
Platelets: 486 10*3/uL — ABNORMAL HIGH (ref 150–400)
RBC: 3.75 MIL/uL — ABNORMAL LOW (ref 3.87–5.11)
RDW: 13.2 % (ref 11.5–15.5)
WBC: 12 10*3/uL — ABNORMAL HIGH (ref 4.0–10.5)
nRBC: 0 % (ref 0.0–0.2)

## 2021-05-04 LAB — TROPONIN I (HIGH SENSITIVITY)
Troponin I (High Sensitivity): 112 ng/L (ref <18)
Troponin I (High Sensitivity): 118 ng/L (ref <18)
Troponin I (High Sensitivity): 130 ng/L (ref <18)

## 2021-05-04 LAB — GLUCOSE, CAPILLARY: Glucose-Capillary: 220 mg/dL — ABNORMAL HIGH (ref 70–99)

## 2021-05-04 LAB — BRAIN NATRIURETIC PEPTIDE: B Natriuretic Peptide: 413.1 pg/mL — ABNORMAL HIGH (ref 0.0–100.0)

## 2021-05-04 MED ORDER — ALBUTEROL SULFATE (2.5 MG/3ML) 0.083% IN NEBU
2.5000 mg | INHALATION_SOLUTION | RESPIRATORY_TRACT | Status: DC | PRN
Start: 2021-05-04 — End: 2021-05-09

## 2021-05-04 MED ORDER — METHYLPREDNISOLONE SODIUM SUCC 40 MG IJ SOLR
40.0000 mg | Freq: Two times a day (BID) | INTRAMUSCULAR | Status: DC
  Administered 2021-05-04 – 2021-05-09 (×10): 40 mg via INTRAVENOUS
  Filled 2021-05-04 (×10): qty 1

## 2021-05-04 MED ORDER — LEVOTHYROXINE SODIUM 100 MCG PO TABS
100.0000 ug | ORAL_TABLET | Freq: Every day | ORAL | Status: DC
  Administered 2021-05-05 – 2021-05-09 (×5): 100 ug via ORAL
  Filled 2021-05-04 (×5): qty 1

## 2021-05-04 MED ORDER — INSULIN ASPART 100 UNIT/ML IJ SOLN
0.0000 [IU] | Freq: Three times a day (TID) | INTRAMUSCULAR | Status: DC
  Administered 2021-05-05: 2 [IU] via SUBCUTANEOUS
  Administered 2021-05-05: 3 [IU] via SUBCUTANEOUS
  Administered 2021-05-05: 1 [IU] via SUBCUTANEOUS
  Administered 2021-05-06: 3 [IU] via SUBCUTANEOUS
  Administered 2021-05-06 – 2021-05-07 (×3): 2 [IU] via SUBCUTANEOUS
  Administered 2021-05-07: 1 [IU] via SUBCUTANEOUS
  Administered 2021-05-07: 2 [IU] via SUBCUTANEOUS
  Administered 2021-05-08: 1 [IU] via SUBCUTANEOUS
  Administered 2021-05-08 – 2021-05-09 (×3): 2 [IU] via SUBCUTANEOUS
  Filled 2021-05-04 (×13): qty 1

## 2021-05-04 MED ORDER — SODIUM CHLORIDE 0.9% FLUSH
3.0000 mL | Freq: Two times a day (BID) | INTRAVENOUS | Status: DC
  Administered 2021-05-04 – 2021-05-09 (×10): 3 mL via INTRAVENOUS

## 2021-05-04 MED ORDER — ACETAMINOPHEN 650 MG RE SUPP: 650.0000 mg | Freq: Four times a day (QID) | RECTAL | Status: DC | PRN

## 2021-05-04 MED ORDER — ENOXAPARIN SODIUM 40 MG/0.4ML IJ SOSY
40.0000 mg | PREFILLED_SYRINGE | INTRAMUSCULAR | Status: DC
  Administered 2021-05-04 – 2021-05-06 (×3): 40 mg via SUBCUTANEOUS
  Filled 2021-05-04 (×3): qty 0.4

## 2021-05-04 MED ORDER — GEMFIBROZIL 600 MG PO TABS
600.0000 mg | ORAL_TABLET | Freq: Two times a day (BID) | ORAL | Status: DC
  Administered 2021-05-04 – 2021-05-09 (×10): 600 mg via ORAL
  Filled 2021-05-04 (×12): qty 1

## 2021-05-04 MED ORDER — IPRATROPIUM-ALBUTEROL 0.5-2.5 (3) MG/3ML IN SOLN
3.0000 mL | Freq: Three times a day (TID) | RESPIRATORY_TRACT | Status: DC
  Administered 2021-05-05 – 2021-05-06 (×5): 3 mL via RESPIRATORY_TRACT
  Filled 2021-05-04 (×6): qty 3

## 2021-05-04 MED ORDER — FUROSEMIDE 10 MG/ML IJ SOLN
40.0000 mg | Freq: Two times a day (BID) | INTRAMUSCULAR | Status: DC
  Administered 2021-05-05 – 2021-05-06 (×3): 40 mg via INTRAVENOUS
  Filled 2021-05-04 (×3): qty 4

## 2021-05-04 MED ORDER — AMLODIPINE BESYLATE 10 MG PO TABS
10.0000 mg | ORAL_TABLET | Freq: Every day | ORAL | Status: DC
  Administered 2021-05-05 – 2021-05-09 (×4): 10 mg via ORAL
  Filled 2021-05-04 (×5): qty 1

## 2021-05-04 MED ORDER — SERTRALINE HCL 50 MG PO TABS
50.0000 mg | ORAL_TABLET | Freq: Every day | ORAL | Status: DC
  Administered 2021-05-05 – 2021-05-09 (×5): 50 mg via ORAL
  Filled 2021-05-04 (×5): qty 1

## 2021-05-04 MED ORDER — SENNOSIDES-DOCUSATE SODIUM 8.6-50 MG PO TABS: 1.0000 | ORAL_TABLET | Freq: Every evening | ORAL | Status: DC | PRN

## 2021-05-04 MED ORDER — ONDANSETRON HCL 4 MG/2ML IJ SOLN: 4.0000 mg | Freq: Four times a day (QID) | INTRAMUSCULAR | Status: DC | PRN

## 2021-05-04 MED ORDER — ACETAMINOPHEN 325 MG PO TABS
650.0000 mg | ORAL_TABLET | Freq: Four times a day (QID) | ORAL | Status: DC | PRN
  Administered 2021-05-05 – 2021-05-08 (×4): 650 mg via ORAL
  Filled 2021-05-04 (×4): qty 2

## 2021-05-04 MED ORDER — FUROSEMIDE 10 MG/ML IJ SOLN
40.0000 mg | Freq: Once | INTRAMUSCULAR | Status: AC
  Administered 2021-05-04: 40 mg via INTRAVENOUS
  Filled 2021-05-04: qty 4

## 2021-05-04 MED ORDER — QUINAPRIL HCL 10 MG PO TABS
20.0000 mg | ORAL_TABLET | Freq: Every day | ORAL | Status: DC
  Administered 2021-05-05 – 2021-05-07 (×3): 20 mg via ORAL
  Filled 2021-05-04 (×3): qty 2

## 2021-05-04 MED ORDER — ONDANSETRON HCL 4 MG PO TABS: 4.0000 mg | ORAL_TABLET | Freq: Four times a day (QID) | ORAL | Status: DC | PRN

## 2021-05-04 MED ORDER — IPRATROPIUM-ALBUTEROL 0.5-2.5 (3) MG/3ML IN SOLN
3.0000 mL | Freq: Once | RESPIRATORY_TRACT | Status: AC
  Administered 2021-05-04: 3 mL via RESPIRATORY_TRACT
  Filled 2021-05-04: qty 3

## 2021-05-04 NOTE — ED Notes (Signed)
Patient is being discharged from the Urgent Care and sent to the Emergency Department via EMS. Per Nelwyn Salisbury, APP, patient is in need of higher level of care due to Low O2 on RA. Patient is aware and verbalizes understanding of plan of care.  Vitals:   05/04/21 1352 05/04/21 1356  BP: 120/71   Pulse: (!) 106   Resp: 18 (!) 28  Temp: 98.6 F (37 C)   SpO2: (!) 86%

## 2021-05-04 NOTE — Plan of Care (Signed)
  Problem: Education: Goal: Knowledge of General Education information will improve Description: Including pain rating scale, medication(s)/side effects and non-pharmacologic comfort measures 05/04/2021 2324 by Rande Brunt, RN Outcome: Progressing 05/04/2021 2324 by Rande Brunt, RN Outcome: Progressing   Problem: Health Behavior/Discharge Planning: Goal: Ability to manage health-related needs will improve 05/04/2021 2324 by Rande Brunt, RN Outcome: Progressing 05/04/2021 2324 by Rande Brunt, RN Outcome: Progressing   Problem: Clinical Measurements: Goal: Ability to maintain clinical measurements within normal limits will improve 05/04/2021 2324 by Rande Brunt, RN Outcome: Progressing 05/04/2021 2324 by Grace Isaac D, RN Outcome: Progressing Goal: Will remain free from infection 05/04/2021 2324 by Rande Brunt, RN Outcome: Progressing 05/04/2021 2324 by Grace Isaac D, RN Outcome: Progressing Goal: Diagnostic test results will improve 05/04/2021 2324 by Rande Brunt, RN Outcome: Progressing 05/04/2021 2324 by Grace Isaac D, RN Outcome: Progressing Goal: Respiratory complications will improve 05/04/2021 2324 by Rande Brunt, RN Outcome: Progressing 05/04/2021 2324 by Grace Isaac D, RN Outcome: Progressing Goal: Cardiovascular complication will be avoided 05/04/2021 2324 by Rande Brunt, RN Outcome: Progressing 05/04/2021 2324 by Rande Brunt, RN Outcome: Progressing   Problem: Activity: Goal: Risk for activity intolerance will decrease 05/04/2021 2324 by Rande Brunt, RN Outcome: Progressing 05/04/2021 2324 by Grace Isaac D, RN Outcome: Progressing   Problem: Nutrition: Goal: Adequate nutrition will be maintained 05/04/2021 2324 by Rande Brunt, RN Outcome: Progressing 05/04/2021 2324 by Grace Isaac D, RN Outcome: Progressing   Problem: Coping: Goal: Level of anxiety will decrease 05/04/2021 2324 by Rande Brunt, RN Outcome:  Progressing 05/04/2021 2324 by Grace Isaac D, RN Outcome: Progressing   Problem: Elimination: Goal: Will not experience complications related to bowel motility 05/04/2021 2324 by Rande Brunt, RN Outcome: Progressing 05/04/2021 2324 by Rande Brunt, RN Outcome: Progressing Goal: Will not experience complications related to urinary retention 05/04/2021 2324 by Rande Brunt, RN Outcome: Progressing 05/04/2021 2324 by Rande Brunt, RN Outcome: Progressing   Problem: Pain Managment: Goal: General experience of comfort will improve 05/04/2021 2324 by Rande Brunt, RN Outcome: Progressing 05/04/2021 2324 by Grace Isaac D, RN Outcome: Progressing   Problem: Safety: Goal: Ability to remain free from injury will improve 05/04/2021 2324 by Rande Brunt, RN Outcome: Progressing 05/04/2021 2324 by Grace Isaac D, RN Outcome: Progressing   Problem: Skin Integrity: Goal: Risk for impaired skin integrity will decrease 05/04/2021 2324 by Rande Brunt, RN Outcome: Progressing 05/04/2021 2324 by Rande Brunt, RN Outcome: Progressing   Problem: Education: Goal: Ability to demonstrate management of disease process will improve Outcome: Progressing Goal: Ability to verbalize understanding of medication therapies will improve Outcome: Progressing Goal: Individualized Educational Video(s) Outcome: Progressing   Problem: Activity: Goal: Capacity to carry out activities will improve Outcome: Progressing   Problem: Cardiac: Goal: Ability to achieve and maintain adequate cardiopulmonary perfusion will improve Outcome: Progressing

## 2021-05-04 NOTE — Telephone Encounter (Signed)
I will follow hospital records and course

## 2021-05-04 NOTE — ED Triage Notes (Signed)
Pt presents today along with sister who is POA/guardian. Patient c/o of nasal congestion/runny nose, sore throat and cough x 2.5 wks. She has been seen by PCP recently and tested negative for Covid. She is also taking Amoxicillin/Clav x 6 days with minimal relief (makes her nauseated).

## 2021-05-04 NOTE — H&P (Signed)
History and Physical    Barbara [name] AJO:[id number] DOB: [birth year] DOA: 05/04/2021  PCP: Abner Greenspan, MD  Patient coming from: Home via urgent care/EMS  I have personally briefly reviewed patient's old medical records in [place]  Chief Complaint: Cough, sore throat, shortness of breath  HPI: Barbara [name] is a 80 y.o. female with medical history significant for T2DM, HTN, HLD, hypothyroidism, anxiety, and cognitive impairment at baseline who presents to the ED for evaluation of cough, shortness of breath and new hypoxia at urgent care.  History somewhat limited by patient due to baseline cognitive impairment and is otherwise obtained from EDP and chart review.  Patient has about 2-1/2 weeks of increasing shortness of breath, cough, sore throat, sinus congestion, and rhinorrhea.  Denies any subjective fevers, chills, diaphoresis, chest pain, nausea, vomiting.  She initially went to urgent care and was found to be hypoxic with SPO2 86% on room air.  She was placed on 2 L supplemental O2 via [place] and sent to the ED for further evaluation.  ED Course:  Initial vitals showed BP 126/81, pulse 114, RR 18, temp 98.6 F, SPO2 95% on room air.  Labs show WBC 12.0, hemoglobin 11.0, platelets 486,000, sodium 135, potassium 4.3, bicarb 20, BUN 16, creatinine 0.76, serum glucose 124, high-sensitivity troponin I 112 > 118, BNP 413.1.  SARS-CoV-2 PCR obtained and pending.  2 view chest x-ray shows patchy hazy and reticular opacities throughout both lungs changes suggestive of bronchiectasis in the lower lobes.  High-resolution chest CT shows patchy groundglass opacity throughout both lungs, mild traction bronchiectasis, mild honeycombing.  Findings suggestive of cardiogenic pulmonary edema superimposed on chronic basilar predominant fibrotic interstitial lung disease.  Dilated main pulmonary artery and three-vessel coronary atherosclerosis noted.  Patient was given IV Lasix 40 mg  once and the hospitalist service was consulted to admit for further evaluation and management.  Review of Systems: All systems reviewed and are negative except as documented in history of present illness above.   Past Medical History:  Diagnosis Date  . Allergy   . Anemia   . Anxiety   . Diabetes mellitus   . Expressive language disorder   . GERD (gastroesophageal reflux disease)   . Hyperlipidemia   . Hypertension   . Learning disability   . Mentally disabled   . Obesity   . Retinal ischemia    history of    Past Surgical History:  Procedure Laterality Date  . CATARACT EXTRACTION Left 2014  . CHOLECYSTECTOMY      Social History:  reports that she has never smoked. She has never used smokeless tobacco. She reports that she does not drink alcohol and does not use drugs.  Allergies  Allergen Reactions  . Atorvastatin     REACTION: increased liver fxn tests  . Ezetimibe     REACTION: stomach upset/malaise  . Sulfonamide Derivatives     REACTION: rash    Family History  Problem Relation Age of Onset  . Heart disease Father   . Hypertension Father   . Heart disease Brother   . Diabetes Brother      Prior to Admission medications   Medication Sig Start Date End Date Taking? Authorizing Provider  acetaminophen (TYLENOL) 500 MG tablet Take 500 mg by mouth every 6 (six) hours as needed.    [provider]  alendronate (FOSAMAX) 70 MG tablet TAKE 1 TABLET BY MOUTH 30 MINUTES BEFOREBREAKFAST ONCE A WEEK WITH ATLEAST 8 OZ. OF WATER.  12/03/20   Tower, Wynelle Fanny, MD  amLODipine (NORVASC) 10 MG tablet Take 1 tablet (10 mg total) by mouth daily. 12/03/20   Tower, Wynelle Fanny, MD  amoxicillin-clavulanate (AUGMENTIN) 875-125 MG tablet Take 1 tablet by mouth 2 (two) times daily. 04/28/21   Tower, Wynelle Fanny, MD  Calcium Carb-Cholecalciferol (CALCIUM + VITAMIN D3 PO) Take 1 tablet by mouth daily.    [provider]  clotrimazole (LOTRIMIN) 1 % cream Apply 1 application  topically 2 (two) times daily. To itchy genital areas 12/03/20   Tower, Wynelle Fanny, MD  ferrous sulfate 325 (65 FE) MG tablet Take 1 tablet (325 mg total) by mouth daily with breakfast. 02/07/13   Gatha Mayer, MD  fexofenadine (ALLEGRA) 180 MG tablet Take 180 mg by mouth as needed.    [provider]  fluticasone (FLONASE) 50 MCG/ACT nasal spray USE TWO SPRAYS IN EACH NOSTRIL DAILY 10/10/19   Tower, Roque Lias A, MD  gemfibrozil (LOPID) 600 MG tablet Take 1 tablet (600 mg total) by mouth 2 (two) times daily. 12/03/20   Tower, Wynelle Fanny, MD  levothyroxine (SYNTHROID) 100 MCG tablet TAKE 1 TABLET BY MOUTH DAILY BEFORE BREAKFAST. 12/03/20   Tower, Wynelle Fanny, MD  metFORMIN (GLUCOPHAGE) 500 MG tablet TAKE 1 TABLET BY MOUTH EVERY DAY WITH BREAKFAST 12/03/20   Tower, Bethel A, MD  nystatin (MYCOSTATIN/NYSTOP) powder Apply topically 2 (two) times daily as needed. To itchy rash under breasts 12/03/20   Tower, Wynelle Fanny, MD  omeprazole (PRILOSEC) 20 MG capsule TAKE ONE CAPSULE BY MOUTH DAILY BEFORE BREAKFAST 12/03/20   Tower, Wynelle Fanny, MD  quinapril (ACCUPRIL) 40 MG tablet TAKE ONE-HALF (0.5) BY MOUTH DAILY 12/03/20   Tower, Wynelle Fanny, MD  sertraline (ZOLOFT) 50 MG tablet Take 1 tablet (50 mg total) by mouth daily. 12/03/20   Tower, Wynelle Fanny, MD  triamcinolone cream (KENALOG) 0.1 % Apply 1 application topically 2 (two) times daily. 10/07/20   Jearld Fenton, NP  vitamin B-12 (CYANOCOBALAMIN) 1000 MCG tablet Take 1 tablet (1,000 mcg total) by mouth daily. 02/07/13   Gatha Mayer, MD    Physical Exam: Vitals:   05/04/21 1618 05/04/21 1630 05/04/21 1700 05/04/21 1730  BP: 134/78 128/81 129/76 130/74  Pulse: (!) 105 (!) 103 (!) 101 94  Resp: 16   16  Temp:      TempSrc:      SpO2: 95% 96% 93% 97%   Constitutional: Elderly woman resting in bed with head elevated, NAD, calm, comfortable Eyes: Opacified right pupil, lids and conjunctivae normal ENMT: Mucous membranes are moist. Posterior pharynx clear of any  exudate or lesions.Normal dentition.  Neck: normal, supple, no masses. Respiratory: Fine inspiratory crackles throughout the lung fields, more prominent at the bases.  Slightly increased respiratory effort. No accessory muscle use.  Cardiovascular: Mildly tachycardic with regular rhythm, no murmurs / rubs / gallops. No extremity edema. 2+ pedal pulses. Abdomen: no tenderness, no masses palpated. No hepatosplenomegaly.  Musculoskeletal: no clubbing / cyanosis. No joint deformity upper and lower extremities. Good ROM, no contractures. Normal muscle tone.  Skin: no rashes, lesions, ulcers. No induration Neurologic: CN 2-12 grossly intact. Sensation intact. Strength 5/5 in all 4.  Psychiatric: Awake, alert, oriented to self.  Knows she is in the hospital but not which 1.  Says the year is 42.  She is able to tell me her sister's name.   Labs on Admission: I have personally reviewed following labs and imaging studies  CBC: Recent Labs  Lab 05/04/21 1534  WBC 12.0*  HGB 11.0*  HCT 32.0*  MCV 85.3  PLT 237*   Basic Metabolic Panel: Recent Labs  Lab 05/04/21 1534  NA 135  K 4.3  CL 100  CO2 20*  GLUCOSE 124*  BUN 16  CREATININE 0.76  CALCIUM 9.9   GFR: CrCl cannot be calculated (Unknown ideal weight.). Liver Function Tests: No results for input(s): AST, ALT, ALKPHOS, BILITOT, PROT, ALBUMIN in the last 168 hours. No results for input(s): LIPASE, AMYLASE in the last 168 hours. No results for input(s): AMMONIA in the last 168 hours. Coagulation Profile: No results for input(s): INR, PROTIME in the last 168 hours. Cardiac Enzymes: No results for input(s): CKTOTAL, CKMB, CKMBINDEX, TROPONINI in the last 168 hours. BNP (last 3 results) No results for input(s): PROBNP in the last 8760 hours. HbA1C: No results for input(s): HGBA1C in the last 72 hours. CBG: No results for input(s): GLUCAP in the last 168 hours. Lipid Profile: No results for input(s): CHOL, HDL, LDLCALC, TRIG,  CHOLHDL, LDLDIRECT in the last 72 hours. Thyroid Function Tests: No results for input(s): TSH, T4TOTAL, FREET4, T3FREE, THYROIDAB in the last 72 hours. Anemia Panel: No results for input(s): VITAMINB12, FOLATE, FERRITIN, TIBC, IRON, RETICCTPCT in the last 72 hours. Urine analysis: No results found for: COLORURINE, APPEARANCEUR, LABSPEC, [place], GLUCOSEU, HGBUR, BILIRUBINUR, KETONESUR, PROTEINUR, UROBILINOGEN, NITRITE, LEUKOCYTESUR  Radiological Exams on Admission: DG Chest 2 View  Result Date: 05/04/2021 CLINICAL DATA:  Dyspnea for few weeks EXAM: CHEST - 2 VIEW COMPARISON:  None. FINDINGS: Normal heart size. Normal mediastinal contour. No pneumothorax. No pleural effusion. Patchy hazy and reticular opacities throughout both lungs with a basilar predominance. Suggestion of tram track opacities in the left greater than right lower lobes. Lower thoracic vertebral compression fracture of indeterminate chronicity. IMPRESSION: Patchy hazy and reticular opacities throughout both lungs with a basilar predominance, with suggestion of tram track opacities in the left greater than right lower lobes suggestive of bronchiectasis. Findings are suggestive of interstitial lung disease. Consider dedicated high-resolution chest CT for further evaluation. Electronically Signed   By: Ilona Sorrel M.D.   On: 05/04/2021 16:06   CT Chest High Resolution  Result Date: 05/04/2021 CLINICAL DATA:  Abnormal chest radiograph, dyspnea EXAM: CT CHEST WITHOUT CONTRAST TECHNIQUE: Multidetector CT imaging of the chest was performed following the standard protocol without intravenous contrast. High resolution imaging of the lungs, as well as inspiratory and expiratory imaging, was performed. COMPARISON:  Chest radiograph from earlier today. FINDINGS: Motion degraded scan, limiting assessment. Cardiovascular: Mild cardiomegaly. No significant pericardial effusion/thickening. Three-vessel coronary atherosclerosis. Atherosclerotic  nonaneurysmal thoracic aorta. Dilated main pulmonary artery (3.8 cm diameter). Mediastinum/Nodes: No discrete thyroid nodules. Unremarkable esophagus. No axillary adenopathy. Mildly enlarged 1.5 cm right paratracheal node (series 2/image 50). Mildly enlarged 1.3 cm subcarinal node (series 2/image 63). Mildly enlarged 1.0 cm left prevascular node (series 2/image 51). No discrete hilar adenopathy on these noncontrast images. Lungs/Pleura: No pneumothorax. No pleural effusion. No acute consolidative airspace disease, lung masses or significant pulmonary nodules. Subcentimeter calcified basilar left lower lobe granuloma. Widespread patchy ground-glass opacities throughout both lungs. Patchy subpleural reticulation and mild interlobular septal thickening with associated mild traction bronchiectasis and architectural distortion, most prominent in the lower lungs. Scattered mild honeycombing in the dependent lower lobes bilaterally, right greater than left, poorly evaluated due to motion degradation. No significant lobular air trapping or evidence of tracheobronchomalacia on the expiration sequence. Upper abdomen: Small hiatal hernia.  Cholecystectomy. Musculoskeletal: No aggressive appearing focal  osseous lesions. Mild T4 vertebral compression fracture and severe T10 vertebral compression fracture of indeterminate chronicity, chronic appearing. Mild thoracic spondylosis. IMPRESSION: 1. Motion degraded scan, limiting assessment. 2. Mild cardiomegaly. 3. Patchy ground-glass opacity throughout both lungs. Mild traction bronchiectasis, architectural distortion and mild honeycombing, basilar predominant. Findings are suggestive of cardiogenic pulmonary edema superimposed on chronic basilar predominant fibrotic interstitial lung disease, potentially underlying UIP. Suggest attention on follow-up high-resolution chest CT study in 3-6 months. 4. Three-vessel coronary atherosclerosis. 5. Dilated main pulmonary artery, suggesting  pulmonary arterial hypertension. 6. Mild mediastinal lymphadenopathy, nonspecific, probably reactive. 7. Small hiatal hernia. 8. Chronic appearing T4 and T10 vertebral compression fractures. 9. Aortic Atherosclerosis (ICD10-I70.0). Electronically Signed   By: Ilona Sorrel M.D.   On: 05/04/2021 17:09    EKG: Personally reviewed. Sinus tachycardia, rate 117.  No prior for comparison.  Assessment/Plan Principal Problem:   Acute respiratory failure with hypoxia (HCC) Active Problems:   Controlled type 2 diabetes mellitus without complication, without long-term current use of insulin ([place])   Hypertension associated with diabetes ([place])   Hypothyroid   Barbara [name] is a 80 y.o. female with medical history significant for T2DM, HTN, HLD, hypothyroidism, anxiety, and cognitive impairment at baseline who is admitted with acute respiratory failure with hypoxia.  Acute respiratory failure with hypoxia multifactorial due to acute CHF with pulmonary edema superimposed on ILD: New hypoxia with supplemental O2 requirement, up to 4 L via [place] on admission.  Mild cardiomegaly, pulmonary edema noted on imaging. -Continue IV Lasix 40 mg twice daily -Echocardiogram -Strict I/O's and daily weights  Interstitial lung disease with pulmonary arterial hypertension: ILD findings suggestive of pulmonary arterial hypertension seen on CT imaging.  Potentially UIP per radiology read.  Bronchiectasis also noted.  Given increased oxygen requirement and tachypnea will start steroids. -IV Solu-Medrol 40 mg twice daily -Scheduled duonebs with as needed albuterol -Continue supplemental oxygen and wean off as able  Elevated troponin: Suspect demand ischemia secondary to hypoxic respiratory failure/acute CHF.  CT imaging did show three-vessel coronary atherosclerosis.  Patient denies any chest pain.  Continue to trend cardiac enzymes.  Type 2 diabetes: Hold metformin, place on sensitive SSI.  Hypertension: Continue  amlodipine and quinapril.  Hypothyroidism: Continue Synthroid.  Hyperlipidemia: Continue Lopid.  Depression/anxiety: Continue sertraline.  DVT prophylaxis: Lovenox Code Status: Full code Family Communication: Attempted patient's sister by phone without answer Disposition Plan: From home, dispo pending clinical progress Consults called: None Level of care: Med-Surg Admission status:  Status is: Observation  The patient remains OBS appropriate and will d/c before 2 midnights.  Dispo: The patient is from: Home              Anticipated d/c is to: Home              Patient currently is not medically stable to d/c.   Difficult to place patient No  Zada Finders MD [facility]  If 7PM-7AM, please contact night-coverage [email or web address]  05/04/2021, 6:56 PM

## 2021-05-04 NOTE — Telephone Encounter (Signed)
[facility] - Client TELEPHONE ADVICE RECORD [facility] Patient Name: Barbara [name] Gender: Female DOB: 10-Aug-1941 Age: 80 Y 2 M 20 D Return Phone Number: [phone] (Primary), [phone] (Secondary) Address[place] [address] Client [facility] - Client Client Site [place] Glori Bickers, Roque Lias - MD Contact Type Call Who Is Calling Patient / Member / Family / Caregiver Call Type Triage / Clinical Caller Name [place] Relationship To Patient Sibling Return Phone Number [phone] (Primary) Chief Complaint CHEST PAIN - pain, pressure, heaviness or tightness Reason for Call Symptomatic / Request for Helper states, pt has chest poain, head funny feeling, lost of her taste, and congestion- weeks. [place] Not Listed [facility] Translation No Nurse Assessment Nurse: Rolena Infante, RN, Patrice Date/Time (Eastern Time): 05/04/2021 9:36:23 AM Confirm and document reason for call. If symptomatic, describe symptoms. ---Caller stated her sister has been sick for 21/2 weeks with congestion, chest pain, loss of taste, COVID test was negative last Thursday. Still hurting in her chest, Headache, congestion, cough. No fever. Loss of taste still. Does the patient have any new or worsening symptoms? ---Yes Will a triage be completed? ---Yes Related visit to physician within the last 2 weeks? ---No Does the PT have any chronic conditions? (i.e. diabetes, asthma, this includes High risk factors for pregnancy, etc.) ---Yes List chronic conditions. ---Diabetes, ? HTN Is this a behavioral health or substance abuse call? ---No Guidelines Guideline Title Affirmed Question Affirmed Notes Nurse Date/Time (Eastern Time) Chest Pain Patient sounds very sick or weak to the triager Rolena Infante, RN, [place] 05/04/2021  9:44:01 AM PLEASE NOTE: All timestamps contained within this report are represented as [place] Standard Time. CONFIDENTIALTY NOTICE: This fax transmission is intended only for the addressee. It contains information that is legally privileged, confidential or otherwise protected from use or disclosure. If you are not the intended recipient, you are strictly prohibited from reviewing, disclosing, copying using or disseminating any of this information or taking any action in reliance on or regarding this information. If you have received this fax in error, please notify us immediately by telephone so that we can arrange for its return to [place]. Phone: [phone], Toll-Free: [phone], Fax: [phone] Page: 2 of 2 Call Id: [id number] [place]. Time Eilene Ghazi Time) Disposition Final User 05/04/2021 9:34:34 AM Send to Urgent Queue Lonia Farber 05/04/2021 9:52:06 AM Go to ED Now (or PCP triage) Yes Rolena Infante, RN, Patrice Caller Disagree/Comply Comply Caller Understands Yes PreDisposition Did not know what to do Care Advice Given Per Guideline GO TO ED NOW (OR PCP TRIAGE): BRING MEDICINES: CALL EMS IF: * Severe difficulty breathing occurs * Passes out or becomes too weak to stand * You become worse Referrals GO TO FACILITY OTHER - SPECIFY

## 2021-05-04 NOTE — ED Notes (Signed)
ED charge RN notified of pt being rejected by floor RN, discussing with pt placement at this time.

## 2021-05-04 NOTE — ED Notes (Signed)
Provider notified of low oxygen level. [place] at 2L applied. Provider in with patient now.

## 2021-05-04 NOTE — Plan of Care (Signed)

## 2021-05-04 NOTE — ED Provider Notes (Signed)
[place]    CSN: [id number] Arrival date & time: 05/04/21  1206      History   Chief Complaint Chief Complaint  Patient presents with  . Nasal Congestion  . Sore Throat    HPI Cameka Rae is a 80 y.o. female.   HPI  Nasal Congestion: Patient presents today along with her sister who is her POA and guardian.  Patient has had nasal congestion, runny nose, sore throat, cough for the past 2 and half weeks.  Over the past few days she has also had some shortness of breath.  She states that she saw her PCP last week who tested her for COVID and she came back negative and she was started on Augmentin for suspected pneumonia.  She took all but 4 of her Augmentin pills but symptoms did not improve.  She has had headaches, chest pain for a week, shortness of breath, fatigue. Past Medical History:  Diagnosis Date  . Allergy   . Anemia   . Anxiety   . Diabetes mellitus   . Expressive language disorder   . GERD (gastroesophageal reflux disease)   . Hyperlipidemia   . Hypertension   . Learning disability   . Mentally disabled   . Obesity   . Retinal ischemia    history of    Patient Active Problem List   Diagnosis Date Noted  . Acute sinusitis 04/28/2021  . Viral URI with cough 04/28/2021  . Hepatotoxicity due to statin drug 12/03/2020  . Intertrigo 10/03/2018  . Vitamin D deficiency 03/20/2018  . Osteoporosis 09/25/2016  . Encounter for screening mammogram for breast cancer 09/13/2016  . Estrogen deficiency 09/13/2016  . History of shingles 07/27/2016  . Routine general medical examination at a health care facility 07/08/2015  . Vaginal atrophy 07/01/2013  . Encounter for Medicare annual wellness exam 07/01/2013  . Colon cancer screening 06/26/2012  . Hypothyroid 06/18/2012  . Encounter for routine gynecological examination 02/15/2012  . Controlled type 2 diabetes mellitus without complication, without long-term current use of insulin ([place]) 06/05/2007   . Hyperlipidemia associated with type 2 diabetes mellitus ([place]) 06/05/2007  . Generalized anxiety disorder 06/05/2007  . EXPRESSIVE LANGUAGE DISORDER 06/05/2007  . ISCHEMIA, RETINAL 06/05/2007  . Essential hypertension 06/05/2007  . ALLERGIC RHINITIS 06/05/2007    Past Surgical History:  Procedure Laterality Date  . CATARACT EXTRACTION Left 2014  . CHOLECYSTECTOMY      OB History    Gravida  0   Para  0   Term  0   Preterm  0   AB  0   Living  0     SAB  0   IAB  0   Ectopic  0   Multiple  0   Live Births               Home Medications    Prior to Admission medications   Medication Sig Start Date End Date Taking? Authorizing Provider  alendronate (FOSAMAX) 70 MG tablet TAKE 1 TABLET BY MOUTH 30 MINUTES BEFOREBREAKFAST ONCE A WEEK WITH ATLEAST 8 OZ. OF WATER. 12/03/20  Yes Tower, Wynelle Fanny, MD  amLODipine (NORVASC) 10 MG tablet Take 1 tablet (10 mg total) by mouth daily. 12/03/20  Yes Tower, Wynelle Fanny, MD  Calcium Carb-Cholecalciferol (CALCIUM + VITAMIN D3 PO) Take 1 tablet by mouth daily.   Yes [provider]  clotrimazole (LOTRIMIN) 1 % cream Apply 1 application topically 2 (two) times daily. To itchy genital  areas 12/03/20  Yes Tower, Wynelle Fanny, MD  ferrous sulfate 325 (65 FE) MG tablet Take 1 tablet (325 mg total) by mouth daily with breakfast. 02/07/13  Yes Gatha Mayer, MD  fexofenadine (ALLEGRA) 180 MG tablet Take 180 mg by mouth as needed.   Yes [provider]  fluticasone (FLONASE) 50 MCG/ACT nasal spray USE TWO SPRAYS IN EACH NOSTRIL DAILY 10/10/19  Yes Tower, Marne A, MD  gemfibrozil (LOPID) 600 MG tablet Take 1 tablet (600 mg total) by mouth 2 (two) times daily. 12/03/20  Yes Tower, Wynelle Fanny, MD  levothyroxine (SYNTHROID) 100 MCG tablet TAKE 1 TABLET BY MOUTH DAILY BEFORE BREAKFAST. 12/03/20  Yes Tower, Wynelle Fanny, MD  metFORMIN (GLUCOPHAGE) 500 MG tablet TAKE 1 TABLET BY MOUTH EVERY DAY WITH BREAKFAST 12/03/20  Yes Tower, Wynelle Fanny, MD   omeprazole (PRILOSEC) 20 MG capsule TAKE ONE CAPSULE BY MOUTH DAILY BEFORE BREAKFAST 12/03/20  Yes Tower, Wynelle Fanny, MD  quinapril (ACCUPRIL) 40 MG tablet TAKE ONE-HALF (0.5) BY MOUTH DAILY 12/03/20  Yes Tower, Marne A, MD  sertraline (ZOLOFT) 50 MG tablet Take 1 tablet (50 mg total) by mouth daily. 12/03/20  Yes Tower, Wynelle Fanny, MD  vitamin B-12 (CYANOCOBALAMIN) 1000 MCG tablet Take 1 tablet (1,000 mcg total) by mouth daily. 02/07/13  Yes Gatha Mayer, MD  acetaminophen (TYLENOL) 500 MG tablet Take 500 mg by mouth every 6 (six) hours as needed.    [provider]  amoxicillin-clavulanate (AUGMENTIN) 875-125 MG tablet Take 1 tablet by mouth 2 (two) times daily. 04/28/21   Tower, Wynelle Fanny, MD  nystatin (MYCOSTATIN/NYSTOP) powder Apply topically 2 (two) times daily as needed. To itchy rash under breasts 12/03/20   Tower, Roque Lias A, MD  triamcinolone cream (KENALOG) 0.1 % Apply 1 application topically 2 (two) times daily. 10/07/20   Jearld Fenton, NP    Family History Family History  Problem Relation Age of Onset  . Heart disease Father   . Hypertension Father   . Heart disease Brother   . Diabetes Brother     Social History Social History   Tobacco Use  . Smoking status: Never Smoker  . Smokeless tobacco: Never Used  Vaping Use  . Vaping Use: Never used  Substance Use Topics  . Alcohol use: No    Alcohol/week: 0.0 standard drinks  . Drug use: No     Allergies   Atorvastatin, Ezetimibe, and Sulfonamide derivatives   Review of Systems Review of Systems  As stated above in HPI Physical Exam Triage Vital Signs ED Triage Vitals  Enc Vitals Group     BP 05/04/21 1352 120/71     Pulse Rate 05/04/21 1352 (!) 106     Resp 05/04/21 1352 18     Temp 05/04/21 1352 98.6 F (37 C)     Temp Source 05/04/21 1352 Oral     SpO2 05/04/21 1352 (!) 86 %     Weight --      Height --      Head Circumference --      Peak Flow --      Pain Score 05/04/21 1348 0     Pain Loc --       Pain Edu? --      Excl. in [place]? --    No data found.  Updated Vital Signs BP 120/71 (BP Location: Left Arm)   Pulse (!) 106   Temp 98.6 F (37 C) (Oral)   Resp (!) 28  SpO2 (!) 86%   Physical Exam Vitals and nursing note reviewed.  Constitutional:      General: She is not in acute distress.    Appearance: She is well-developed. She is not ill-appearing, toxic-appearing or diaphoretic.  HENT:     Head: Normocephalic and atraumatic.     Nose:     Comments: dry    Mouth/Throat:     Mouth: Mucous membranes are dry.     Pharynx: Oropharynx is clear.     Comments: Scantly cyanotic Cardiovascular:     Rate and Rhythm: Normal rate and regular rhythm.     Heart sounds: Normal heart sounds.  Pulmonary:     Effort: Pulmonary effort is normal.     Breath sounds: Rhonchi and rales present.  Musculoskeletal:     Cervical back: Normal range of motion.  Skin:    General: Skin is dry.     Capillary Refill: Capillary refill takes more than 3 seconds.  Neurological:     Mental Status: She is alert.  Psychiatric:        Mood and Affect: Mood normal.        Behavior: Behavior normal.      UC Treatments / Results  Labs (all labs ordered are listed, but only abnormal results are displayed) Labs Reviewed - No data to display  EKG   Radiology No results found.  Procedures Procedures (including critical care time)  Medications Ordered in UC Medications - No data to display  Initial Impression / Assessment and Plan / UC Course  I have reviewed the triage vital signs and the nursing notes.  Pertinent labs & imaging results that were available during my care of the patient were reviewed by me and considered in my medical decision making (see chart for details).     New. Hypoxia. Pt started on oxygen immediately as she has no history of hypoxia, COPD. Discussed ER recommendation via EMS which they are agreeable to.  Final Clinical Impressions(s) / UC Diagnoses   Final  diagnoses:  None   Discharge Instructions   None    ED Prescriptions    None     PDMP not reviewed this encounter.   Hughie Closs, [place] 05/04/21 1417

## 2021-05-04 NOTE — ED Triage Notes (Signed)
Pt comes via [facility] from UC with c/o SOB. Pt states this has been going on for few weeks. Pt states more SOB upon exertion.  RA-80s per EMS, pt placed on 3L [place] at 98% HR-105 BP-122/70  Dedra Skeens is pt's POA and care [phone]

## 2021-05-04 NOTE — ED Notes (Signed)
Pt reports improvement in symptoms following breathing treatment. Remains on 4L at this time.

## 2021-05-04 NOTE — ED Notes (Signed)
Dr. Posey Pronto messaged via secure chat: "Dr. Posey Pronto. This patients monitor started to sound and her oxygen noted to be 82% on 2LPM. I upped her oxygen to 4LPM but she is tachyphemic 34-40"

## 2021-05-04 NOTE — ED Notes (Signed)
Dr. Patel at bedside 

## 2021-05-04 NOTE — ED Notes (Signed)
Called EMS for pickup of patient

## 2021-05-04 NOTE — Telephone Encounter (Signed)
Per chart review tab pt has been to [facility] UC and [facility] ED. Sending note to Dr Glori Bickers.

## 2021-05-04 NOTE — ED Provider Notes (Signed)
[facility] Emergency Department Provider Note   ____________________________________________    I have reviewed the triage vital signs and the nursing notes.   HISTORY  Chief Complaint Shortness of Breath     HPI Barbara [name] is a 80 y.o. female sent from urgent care for shortness of breath, hypoxia.  Reportedly patient has had increasing shortness of breath over the last several weeks.  Has also had some possibly some nasal congestion as well.  No fevers reported.  Occasional cough.  Denies chest pain to me today.  No pleurisy reported.  No calf pain or swelling.  No nausea or vomiting.  No sick contacts.    Past Medical History:  Diagnosis Date  . Allergy   . Anemia   . Anxiety   . Diabetes mellitus   . Expressive language disorder   . GERD (gastroesophageal reflux disease)   . Hyperlipidemia   . Hypertension   . Learning disability   . Mentally disabled   . Obesity   . Retinal ischemia    history of    Patient Active Problem List   Diagnosis Date Noted  . Acute sinusitis 04/28/2021  . Viral URI with cough 04/28/2021  . Hepatotoxicity due to statin drug 12/03/2020  . Intertrigo 10/03/2018  . Vitamin D deficiency 03/20/2018  . Osteoporosis 09/25/2016  . Encounter for screening mammogram for breast cancer 09/13/2016  . Estrogen deficiency 09/13/2016  . History of shingles 07/27/2016  . Routine general medical examination at a health care facility 07/08/2015  . Vaginal atrophy 07/01/2013  . Encounter for Medicare annual wellness exam 07/01/2013  . Colon cancer screening 06/26/2012  . Hypothyroid 06/18/2012  . Encounter for routine gynecological examination 02/15/2012  . Controlled type 2 diabetes mellitus without complication, without long-term current use of insulin ([place]) 06/05/2007  . Hyperlipidemia associated with type 2 diabetes mellitus ([place]) 06/05/2007  . Generalized anxiety disorder 06/05/2007  . EXPRESSIVE LANGUAGE  DISORDER 06/05/2007  . ISCHEMIA, RETINAL 06/05/2007  . Essential hypertension 06/05/2007  . ALLERGIC RHINITIS 06/05/2007    Past Surgical History:  Procedure Laterality Date  . CATARACT EXTRACTION Left 2014  . CHOLECYSTECTOMY      Prior to Admission medications   Medication Sig Start Date End Date Taking? Authorizing Provider  acetaminophen (TYLENOL) 500 MG tablet Take 500 mg by mouth every 6 (six) hours as needed.    [provider]  alendronate (FOSAMAX) 70 MG tablet TAKE 1 TABLET BY MOUTH 30 MINUTES BEFOREBREAKFAST ONCE A WEEK WITH ATLEAST 8 OZ. OF WATER. 12/03/20   Tower, Wynelle Fanny, MD  amLODipine (NORVASC) 10 MG tablet Take 1 tablet (10 mg total) by mouth daily. 12/03/20   Tower, Wynelle Fanny, MD  amoxicillin-clavulanate (AUGMENTIN) 875-125 MG tablet Take 1 tablet by mouth 2 (two) times daily. 04/28/21   Tower, Wynelle Fanny, MD  Calcium Carb-Cholecalciferol (CALCIUM + VITAMIN D3 PO) Take 1 tablet by mouth daily.    [provider]  clotrimazole (LOTRIMIN) 1 % cream Apply 1 application topically 2 (two) times daily. To itchy genital areas 12/03/20   Tower, Wynelle Fanny, MD  ferrous sulfate 325 (65 FE) MG tablet Take 1 tablet (325 mg total) by mouth daily with breakfast. 02/07/13   Gatha Mayer, MD  fexofenadine (ALLEGRA) 180 MG tablet Take 180 mg by mouth as needed.    [provider]  fluticasone (FLONASE) 50 MCG/ACT nasal spray USE TWO SPRAYS IN [facility] NOSTRIL DAILY 10/10/19   Tower, Wynelle Fanny, MD  gemfibrozil (LOPID) 600 MG tablet Take 1 tablet (600 mg total) by mouth 2 (two) times daily. 12/03/20   Tower, Wynelle Fanny, MD  levothyroxine (SYNTHROID) 100 MCG tablet TAKE 1 TABLET BY MOUTH DAILY BEFORE BREAKFAST. 12/03/20   Tower, Wynelle Fanny, MD  metFORMIN (GLUCOPHAGE) 500 MG tablet TAKE 1 TABLET BY MOUTH EVERY DAY WITH BREAKFAST 12/03/20   Tower, Marion A, MD  nystatin (MYCOSTATIN/NYSTOP) powder Apply topically 2 (two) times daily as needed. To itchy rash under breasts 12/03/20   Tower,  Wynelle Fanny, MD  omeprazole (PRILOSEC) 20 MG capsule TAKE ONE CAPSULE BY MOUTH DAILY BEFORE BREAKFAST 12/03/20   Tower, Wynelle Fanny, MD  quinapril (ACCUPRIL) 40 MG tablet TAKE ONE-HALF (0.5) BY MOUTH DAILY 12/03/20   Tower, Wynelle Fanny, MD  sertraline (ZOLOFT) 50 MG tablet Take 1 tablet (50 mg total) by mouth daily. 12/03/20   Tower, Wynelle Fanny, MD  triamcinolone cream (KENALOG) 0.1 % Apply 1 application topically 2 (two) times daily. 10/07/20   Jearld Fenton, NP  vitamin B-12 (CYANOCOBALAMIN) 1000 MCG tablet Take 1 tablet (1,000 mcg total) by mouth daily. 02/07/13   Gatha Mayer, MD     Allergies Atorvastatin, Ezetimibe, and Sulfonamide derivatives  Family History  Problem Relation Age of Onset  . Heart disease Father   . Hypertension Father   . Heart disease Brother   . Diabetes Brother     Social History Social History   Tobacco Use  . Smoking status: Never Smoker  . Smokeless tobacco: Never Used  Vaping Use  . Vaping Use: Never used  Substance Use Topics  . Alcohol use: No    Alcohol/week: 0.0 standard drinks  . Drug use: No    Review of Systems  Constitutional: No fever/chills Eyes: No visual changes.  ENT: As above Cardiovascular: Denies chest pain. Respiratory: As above Gastrointestinal: No abdominal pain.  No nausea, no vomiting.   Genitourinary: Negative for dysuria. Musculoskeletal: Negative for back pain. Skin: Negative for rash. Neurological: Negative for headaches or weakness   ____________________________________________   PHYSICAL EXAM:  VITAL SIGNS: ED Triage Vitals  Enc Vitals Group     BP 05/04/21 1532 126/81     Pulse Rate 05/04/21 1532 (!) 114     Resp 05/04/21 1532 18     Temp 05/04/21 1532 98.6 F (37 C)     Temp Source 05/04/21 1532 Oral     SpO2 05/04/21 1532 95 %     Weight --      Height --      Head Circumference --      Peak Flow --      Pain Score 05/04/21 1531 4     Pain Loc --      Pain Edu? --      Excl. in [place]? --      Constitutional: Alert Eyes: Conjunctivae are normal.   Nose: No congestion/rhinnorhea. Mouth/Throat: Mucous membranes are moist.    Cardiovascular: Normal rate, regular rhythm. Grossly normal heart sounds.  Good peripheral circulation. Respiratory: Normal respiratory effort.  No retractions.  Bibasilar Rales Gastrointestinal: Soft and nontender. No distention.  No CVA tenderness.  Musculoskeletal: No lower extremity tenderness nor edema.  Warm and well perfused Neurologic:  Normal speech and language. No gross focal neurologic deficits are appreciated.  Skin:  Skin is warm, dry and intact. No rash noted. Psychiatric: Mood and affect are normal. Speech and behavior are normal.  ____________________________________________   LABS (all labs ordered are listed, but only  abnormal results are displayed)  Labs Reviewed  BASIC METABOLIC PANEL - Abnormal; Notable for the following components:      Result Value   CO2 20 (*)    Glucose, Bld 124 (*)    All other components within normal limits  CBC - Abnormal; Notable for the following components:   WBC 12.0 (*)    RBC 3.75 (*)    Hemoglobin 11.0 (*)    HCT 32.0 (*)    Platelets 486 (*)    All other components within normal limits  BRAIN NATRIURETIC PEPTIDE - Abnormal; Notable for the following components:   B Natriuretic Peptide 413.1 (*)    All other components within normal limits  TROPONIN I (HIGH SENSITIVITY) - Abnormal; Notable for the following components:   Troponin I (High Sensitivity) 112 (*)    All other components within normal limits  SARS CORONAVIRUS 2 (TAT 6-24 HRS)  TROPONIN I (HIGH SENSITIVITY)   ____________________________________________  EKG  ED ECG REPORT I, Lavonia Drafts, the attending physician, personally viewed and interpreted this ECG.  Date: 05/04/2021  Rhythm: Sinus tachycardia QRS Axis: normal Intervals: normal ST/T Wave abnormalities: Nonspecific changes Narrative Interpretation: no  evidence of acute ischemia  ____________________________________________  RADIOLOGY  Chest x-ray consistent with bronchiectasis CT chest ____________________________________________   PROCEDURES  Procedure(s) performed: No  Procedures   Critical Care performed: yes  CRITICAL CARE Performed by: Lavonia Drafts   Total critical care time: 30 minutes  Critical care time was exclusive of separately billable procedures and treating other patients.  Critical care was necessary to treat or prevent imminent or life-threatening deterioration.  Critical care was time spent personally by me on the following activities: development of treatment plan with patient and/or surrogate as well as nursing, discussions with consultants, evaluation of patient's response to treatment, examination of patient, obtaining history from patient or surrogate, ordering and performing treatments and interventions, ordering and review of laboratory studies, ordering and review of radiographic studies, pulse oximetry and re-evaluation of patient's condition.  ____________________________________________   INITIAL IMPRESSION / ASSESSMENT AND PLAN / ED COURSE  Pertinent labs & imaging results that were available during my care of the patient were reviewed by me and considered in my medical decision making (see chart for details).  Patient presents with shortness of breath, found to be hypoxic in urgent care, requiring nasal cannula oxygenation to keep saturations above 90%.  Afebrile, blood pressure reassuring.  Minimally elevated white blood cell count.  EKG demonstrates sinus tachycardia initially however this improved with rest.  High sensitive troponin is elevated at 112, likely related to increased work of breathing now improved with oxygen.  Chest x-ray concerning for possible bronchiectasis, high resolution CT obtained which is most consistent with edema overlying bronchiectasis.  We will give IV  Lasix, patient will require admission to the hospitalist service    ____________________________________________   FINAL CLINICAL IMPRESSION(S) / ED DIAGNOSES  Final diagnoses:  Acute congestive heart failure, unspecified heart failure type ([place])  Acute respiratory failure with hypoxia ([place])        Note:  This document was prepared using Dragon voice recognition software and may include unintentional dictation errors.   Lavonia Drafts, MD 05/04/21 [phone]

## 2021-05-05 ENCOUNTER — Encounter: Payer: Self-pay | Admitting: Internal Medicine

## 2021-05-05 ENCOUNTER — Observation Stay
Admit: 2021-05-05 | Discharge: 2021-05-05 | Disposition: A | Discharge status: Home / Self Care | Payer: Medicare HMO | Attending: Internal Medicine | Admitting: Internal Medicine

## 2021-05-05 DIAGNOSIS — Z7983 Long term (current) use of bisphosphonates: Secondary | ICD-10-CM | POA: Diagnosis not present

## 2021-05-05 DIAGNOSIS — E1165 Type 2 diabetes mellitus with hyperglycemia: Secondary | ICD-10-CM | POA: Diagnosis not present

## 2021-05-05 DIAGNOSIS — Z8249 Family history of ischemic heart disease and other diseases of the circulatory system: Secondary | ICD-10-CM | POA: Diagnosis not present

## 2021-05-05 DIAGNOSIS — N179 Acute kidney failure, unspecified: Secondary | ICD-10-CM | POA: Diagnosis present

## 2021-05-05 DIAGNOSIS — Z79890 Hormone replacement therapy (postmenopausal): Secondary | ICD-10-CM | POA: Diagnosis not present

## 2021-05-05 DIAGNOSIS — I5031 Acute diastolic (congestive) heart failure: Secondary | ICD-10-CM | POA: Diagnosis present

## 2021-05-05 DIAGNOSIS — I248 Other forms of acute ischemic heart disease: Secondary | ICD-10-CM | POA: Diagnosis present

## 2021-05-05 DIAGNOSIS — I2510 Atherosclerotic heart disease of native coronary artery without angina pectoris: Secondary | ICD-10-CM | POA: Diagnosis present

## 2021-05-05 DIAGNOSIS — I509 Heart failure, unspecified: Secondary | ICD-10-CM | POA: Diagnosis not present

## 2021-05-05 DIAGNOSIS — J479 Bronchiectasis, uncomplicated: Secondary | ICD-10-CM | POA: Diagnosis present

## 2021-05-05 DIAGNOSIS — J9690 Respiratory failure, unspecified, unspecified whether with hypoxia or hypercapnia: Secondary | ICD-10-CM | POA: Diagnosis not present

## 2021-05-05 DIAGNOSIS — F419 Anxiety disorder, unspecified: Secondary | ICD-10-CM | POA: Diagnosis present

## 2021-05-05 DIAGNOSIS — E11649 Type 2 diabetes mellitus with hypoglycemia without coma: Secondary | ICD-10-CM | POA: Diagnosis not present

## 2021-05-05 DIAGNOSIS — I110 Hypertensive heart disease with heart failure: Secondary | ICD-10-CM | POA: Diagnosis present

## 2021-05-05 DIAGNOSIS — Z7984 Long term (current) use of oral hypoglycemic drugs: Secondary | ICD-10-CM | POA: Diagnosis not present

## 2021-05-05 DIAGNOSIS — Z79899 Other long term (current) drug therapy: Secondary | ICD-10-CM | POA: Diagnosis not present

## 2021-05-05 DIAGNOSIS — I517 Cardiomegaly: Secondary | ICD-10-CM | POA: Diagnosis not present

## 2021-05-05 DIAGNOSIS — E039 Hypothyroidism, unspecified: Secondary | ICD-10-CM | POA: Diagnosis present

## 2021-05-05 DIAGNOSIS — Z20822 Contact with and (suspected) exposure to covid-19: Secondary | ICD-10-CM | POA: Diagnosis present

## 2021-05-05 DIAGNOSIS — Z833 Family history of diabetes mellitus: Secondary | ICD-10-CM | POA: Diagnosis not present

## 2021-05-05 DIAGNOSIS — E785 Hyperlipidemia, unspecified: Secondary | ICD-10-CM | POA: Diagnosis present

## 2021-05-05 DIAGNOSIS — J9601 Acute respiratory failure with hypoxia: Secondary | ICD-10-CM | POA: Diagnosis present

## 2021-05-05 DIAGNOSIS — J849 Interstitial pulmonary disease, unspecified: Secondary | ICD-10-CM | POA: Diagnosis present

## 2021-05-05 DIAGNOSIS — J811 Chronic pulmonary edema: Secondary | ICD-10-CM | POA: Diagnosis present

## 2021-05-05 DIAGNOSIS — Z882 Allergy status to sulfonamides status: Secondary | ICD-10-CM | POA: Diagnosis not present

## 2021-05-05 DIAGNOSIS — F32A Depression, unspecified: Secondary | ICD-10-CM | POA: Diagnosis present

## 2021-05-05 DIAGNOSIS — Z9049 Acquired absence of other specified parts of digestive tract: Secondary | ICD-10-CM | POA: Diagnosis not present

## 2021-05-05 DIAGNOSIS — N1832 Chronic kidney disease, stage 3b: Secondary | ICD-10-CM | POA: Diagnosis not present

## 2021-05-05 LAB — CBC
HCT: 29.8 % — ABNORMAL LOW (ref 36.0–46.0)
Hemoglobin: 10.3 g/dL — ABNORMAL LOW (ref 12.0–15.0)
MCH: 29 pg (ref 26.0–34.0)
MCHC: 34.6 g/dL (ref 30.0–36.0)
MCV: 83.9 fL (ref 80.0–100.0)
Platelets: 444 10*3/uL — ABNORMAL HIGH (ref 150–400)
RBC: 3.55 MIL/uL — ABNORMAL LOW (ref 3.87–5.11)
RDW: 13.1 % (ref 11.5–15.5)
WBC: 9 10*3/uL (ref 4.0–10.5)
nRBC: 0 % (ref 0.0–0.2)

## 2021-05-05 LAB — GLUCOSE, CAPILLARY
Glucose-Capillary: 137 mg/dL — ABNORMAL HIGH (ref 70–99)
Glucose-Capillary: 169 mg/dL — ABNORMAL HIGH (ref 70–99)
Glucose-Capillary: 214 mg/dL — ABNORMAL HIGH (ref 70–99)
Glucose-Capillary: 163 mg/dL — ABNORMAL HIGH (ref 70–99)

## 2021-05-05 LAB — BASIC METABOLIC PANEL
Anion gap: 13 (ref 5–15)
BUN: 21 mg/dL (ref 8–23)
CO2: 23 mmol/L (ref 22–32)
Calcium: 9.4 mg/dL (ref 8.9–10.3)
Chloride: 102 mmol/L (ref 98–111)
Creatinine, Ser: 0.81 mg/dL (ref 0.44–1.00)
GFR, Estimated: >60 mL/min (ref >60)
Glucose, Bld: 149 mg/dL — ABNORMAL HIGH (ref 70–99)
Potassium: 3.6 mmol/L (ref 3.5–5.1)
Sodium: 138 mmol/L (ref 135–145)

## 2021-05-05 LAB — MAGNESIUM: Magnesium: 1.8 mg/dL (ref 1.7–2.4)

## 2021-05-05 LAB — SARS CORONAVIRUS 2 (TAT 6-24 HRS): SARS Coronavirus 2: NEGATIVE

## 2021-05-05 NOTE — Progress Notes (Signed)
   05/04/21 2138  Assess: MEWS Score  Temp 98 F (36.7 C)  BP 122/72  Pulse Rate (!) 101  ECG Heart Rate (!) 103  Resp (!) 30  Level of Consciousness Alert  SpO2 90 %  O2 Device Nasal Cannula  Patient Activity (if Appropriate) In bed  O2 Flow Rate (L/min) 4 L/min  Assess: MEWS Score  MEWS Temp 0  MEWS Systolic 0  MEWS Pulse 1  MEWS RR 2  MEWS LOC 0  MEWS Score 3  MEWS Score Color Yellow  Assess: if the MEWS score is Yellow or Red  Were vital signs taken at a resting state? Yes  Focused Assessment No change from prior assessment  Early Detection of Sepsis Score *See Row Information* Low  MEWS guidelines implemented *See Row Information* No, previously yellow, continue vital signs every 4 hours  Treat  MEWS Interventions Administered scheduled meds/treatments  Escalate  MEWS: Escalate Yellow: discuss with charge nurse/RN and consider discussing with provider and RRT  Notify: Charge Nurse/RN  Name of Charge Nurse/RN Notified Kiara,RN  Date Charge Nurse/RN Notified 05/04/21  Time Charge Nurse/RN Notified 2140  Document  Patient Outcome Stabilized after interventions

## 2021-05-05 NOTE — Progress Notes (Addendum)
PROGRESS NOTE    Barbara [name]  VZC:[id number] DOB: 1941/02/14 DOA: 05/04/2021 PCP: Abner Greenspan, MD     Brief Narrative:  Barbara [name] is a 80 y.o. female with medical history significant for T2DM, HTN, HLD, hypothyroidism, anxiety, and cognitive impairment at baseline who presents to the ED for evaluation of cough, shortness of breath and new hypoxia at urgent care. Patient has about 2-1/2 weeks of increasing shortness of breath, cough, sore throat, sinus congestion, and rhinorrhea.  Denies any subjective fevers, chills, diaphoresis, chest pain, nausea, vomiting.  She initially went to urgent care and was found to be hypoxic with SPO2 86% on room air.  She was placed on 2 L supplemental O2 via [place] and sent to the ED for further evaluation. Chest x-ray shows patchy hazy and reticular opacities throughout both lungs changes suggestive of bronchiectasis in the lower lobes. High-resolution chest CT shows patchy groundglass opacity throughout both lungs, mild traction bronchiectasis, mild honeycombing.  Findings suggestive of cardiogenic pulmonary edema superimposed on chronic basilar predominant fibrotic interstitial lung disease. Dilated main pulmonary artery and three-vessel coronary atherosclerosis noted. Patient was given IV Lasix 40 mg once and the hospitalist service was consulted to admit for further evaluation and management.  New events last 24 hours / Subjective: Patient sitting at the side of the bed, had taken off her supplemental oxygen.  SPO2 dropped down to mid 80s, improved with replacing nasal cannula.  Patient denied any shortness of breath, chest pain, nausea or vomiting.  She remains a poor historian, could not answer questions regarding HPI.  Assessment & Plan:   Principal Problem:   Acute respiratory failure with hypoxia (HCC) Active Problems:   Controlled type 2 diabetes mellitus without complication, without long-term current use of insulin ([place])    Hypertension associated with diabetes ([place])   Hypothyroid   Elevated troponin   Acute hypoxemic respiratory failure secondary to CHF and ILD -Patient noted to be satting 86% on room air, placed on 4 L oxygen on admission -Wean nasal cannula O2 as able  ?ILD -Continue Solu-Medrol -CT chest with potential UIP. Follow up chest CT rec in 2-6 months   Acute CHF -BNP 413 -Echocardiogram pending  -IV Lasix  Demand ischemia -Secondary to above, patient without any chest pain complaints  Diabetes mellitus type 2 -Continue sliding scale insulin  Hypertension -Continue amlodipine, quinapril  Hypothyroidism -Continue Synthroid  Hyperlipidemia -Continue lopid   Depression/anxiety -Continue zoloft     DVT prophylaxis:  enoxaparin (LOVENOX) injection 40 mg Start: 05/04/21 2200  Code Status:     Code Status Orders  (From admission, onward)         Start     Ordered   05/04/21 1933  Full code  Continuous        05/04/21 1933        Code Status History    This patient has a current code status but no historical code status.   Advance Care Planning Activity     Family Communication: Called sister, no answer, no voicemail box set up  Disposition Plan:  Status is: Observation  The patient will require care spanning > 2 midnights and should be moved to inpatient because: IV treatments appropriate due to intensity of illness or inability to take PO  Dispo: The patient is from: Home              Anticipated d/c is to: Home  Patient currently is not medically stable to d/c.   Difficult to place patient No      Consultants:   None  Procedures:   None   Antimicrobials:  Anti-infectives (From admission, onward)   None        Objective: Vitals:   05/05/21 0250 05/05/21 0801 05/05/21 0842 05/05/21 1133  BP: 113/69  109/74 113/75  Pulse: 88  (!) 116 99  Resp:   19 16  Temp: 98 F (36.7 C)  98.1 F (36.7 C) (!) 97.4 F (36.3 C)   TempSrc: Oral  Oral Oral  SpO2: 94% 92% 90% 93%  Weight:        Intake/Output Summary (Last 24 hours) at 05/05/2021 1431 Last data filed at 05/05/2021 1359 Gross per 24 hour  Intake 480 ml  Output 900 ml  Net -420 ml   Filed Weights   05/04/21 2027  Weight: 59 kg    Examination:  General exam: Appears calm and comfortable  Respiratory system: Bilateral crackles. Respiratory effort normal. No respiratory distress. No conversational dyspnea.  Cardiovascular system: S1 & S2 heard, tachycardic, regular rhythm. No murmurs. No pedal edema. Gastrointestinal system: Abdomen is nondistended, soft and nontender. Normal bowel sounds heard. Central nervous system: Alert  Extremities: Symmetric in appearance  Skin: No rashes, lesions or ulcers on exposed skin  Psychiatry: Judgement and insight appear poor. Mood & affect appropriate.   Data Reviewed: I have personally reviewed following labs and imaging studies  CBC: Recent Labs  Lab 05/04/21 1534 05/05/21 0555  WBC 12.0* 9.0  HGB 11.0* 10.3*  HCT 32.0* 29.8*  MCV 85.3 83.9  PLT 486* 102*   Basic Metabolic Panel: Recent Labs  Lab 05/04/21 1534 05/05/21 0555  NA 135 138  K 4.3 3.6  CL 100 102  CO2 20* 23  GLUCOSE 124* 149*  BUN 16 21  CREATININE 0.76 0.81  CALCIUM 9.9 9.4  MG  --  1.8   GFR: Estimated Creatinine Clearance: 45.6 mL/min (by C-G formula based on SCr of 0.81 mg/dL). Liver Function Tests: No results for input(s): AST, ALT, ALKPHOS, BILITOT, PROT, ALBUMIN in the last 168 hours. No results for input(s): LIPASE, AMYLASE in the last 168 hours. No results for input(s): AMMONIA in the last 168 hours. Coagulation Profile: No results for input(s): INR, PROTIME in the last 168 hours. Cardiac Enzymes: No results for input(s): CKTOTAL, CKMB, CKMBINDEX, TROPONINI in the last 168 hours. BNP (last 3 results) No results for input(s): PROBNP in the last 8760 hours. HbA1C: No results for input(s): HGBA1C in the last 72  hours. CBG: Recent Labs  Lab 05/04/21 2322 05/05/21 0747 05/05/21 1143  GLUCAP 220* 137* 169*   Lipid Profile: No results for input(s): CHOL, HDL, LDLCALC, TRIG, CHOLHDL, LDLDIRECT in the last 72 hours. Thyroid Function Tests: No results for input(s): TSH, T4TOTAL, FREET4, T3FREE, THYROIDAB in the last 72 hours. Anemia Panel: No results for input(s): VITAMINB12, FOLATE, FERRITIN, TIBC, IRON, RETICCTPCT in the last 72 hours. Sepsis Labs: No results for input(s): PROCALCITON, LATICACIDVEN in the last 168 hours.  Recent Results (from the past 240 hour(s))  Novel Coronavirus, NAA ([facility])     Status: None   Collection Time: 04/28/21 12:00 AM   Specimen: Nasopharyngeal(NP) swabs in vial transport medium   Nasopharynge  Result Value Ref Range Status   SARS-CoV-2, NAA Comment Not Detected Final    Comment: We are unable to reliably determine a result for the specimen due to the presence of PCR inhibitor(s)  in the specimen submitted.  If clinically indicated, please recollect an additional specimen for testing. This nucleic acid amplification test was developed and its performance characteristics determined by [employer]. Nucleic acid amplification tests include RT-PCR and TMA. This test has not been FDA cleared or approved. This test has been authorized by FDA under an Emergency Use Authorization (EUA). This test is only authorized for the duration of time the declaration that circumstances exist justifying the authorization of the emergency use of in vitro diagnostic tests for detection of SARS-CoV-2 virus and/or diagnosis of COVID-19 infection under section 564(b)(1) of the Act, 21 U.S.C. 517GYF-7(C) (1), unless the authorization is terminated or revoked sooner. When diagnostic testing is negative, the possibility of a false negative result should be considere d in the context of a patient's recent exposures and the presence of clinical signs and symptoms consistent  with COVID-19. An individual without symptoms of COVID-19 and who is not shedding SARS-CoV-2 virus would expect to have a negative (not detected) result in this assay.   SARS CORONAVIRUS 2 (TAT 6-24 HRS) Nasopharyngeal Nasopharyngeal Swab     Status: None   Collection Time: 05/04/21  5:42 PM   Specimen: Nasopharyngeal Swab  Result Value Ref Range Status   SARS Coronavirus 2 NEGATIVE NEGATIVE Final    Comment: (NOTE) SARS-CoV-2 target nucleic acids are NOT DETECTED.  The SARS-CoV-2 RNA is generally detectable in upper and lower respiratory specimens during the acute phase of infection. Negative results do not preclude SARS-CoV-2 infection, do not rule out co-infections with other pathogens, and should not be used as the sole basis for treatment or other patient management decisions. Negative results must be combined with clinical observations, patient history, and epidemiological information. The expected result is Negative.  Fact Sheet for Patients: SugarRoll.be  Fact Sheet for Healthcare Providers: https://www.woods-mathews.com/  This test is not yet approved or cleared by the [place] FDA and  has been authorized for detection and/or diagnosis of SARS-CoV-2 by FDA under an Emergency Use Authorization (EUA). This EUA will remain  in effect (meaning this test can be used) for the duration of the COVID-19 declaration under Se ction 564(b)(1) of the Act, 21 U.S.C. section 360bbb-3(b)(1), unless the authorization is terminated or revoked sooner.  Performed at [facility], [place] [address] [place], [place] [address]       Radiology Studies: DG Chest 2 View  Result Date: 05/04/2021 CLINICAL DATA:  Dyspnea for few weeks EXAM: CHEST - 2 VIEW COMPARISON:  None. FINDINGS: Normal heart size. Normal mediastinal contour. No pneumothorax. No pleural effusion. Patchy hazy and reticular opacities throughout both lungs with a basilar  predominance. Suggestion of tram track opacities in the left greater than right lower lobes. Lower thoracic vertebral compression fracture of indeterminate chronicity. IMPRESSION: Patchy hazy and reticular opacities throughout both lungs with a basilar predominance, with suggestion of tram track opacities in the left greater than right lower lobes suggestive of bronchiectasis. Findings are suggestive of interstitial lung disease. Consider dedicated high-resolution chest CT for further evaluation. Electronically Signed   By: Ilona Sorrel M.D.   On: 05/04/2021 16:06   CT Chest High Resolution  Result Date: 05/04/2021 CLINICAL DATA:  Abnormal chest radiograph, dyspnea EXAM: CT CHEST WITHOUT CONTRAST TECHNIQUE: Multidetector CT imaging of the chest was performed following the standard protocol without intravenous contrast. High resolution imaging of the lungs, as well as inspiratory and expiratory imaging, was performed. COMPARISON:  Chest radiograph from earlier today. FINDINGS: Motion degraded scan, limiting  assessment. Cardiovascular: Mild cardiomegaly. No significant pericardial effusion/thickening. Three-vessel coronary atherosclerosis. Atherosclerotic nonaneurysmal thoracic aorta. Dilated main pulmonary artery (3.8 cm diameter). Mediastinum/Nodes: No discrete thyroid nodules. Unremarkable esophagus. No axillary adenopathy. Mildly enlarged 1.5 cm right paratracheal node (series 2/image 50). Mildly enlarged 1.3 cm subcarinal node (series 2/image 63). Mildly enlarged 1.0 cm left prevascular node (series 2/image 51). No discrete hilar adenopathy on these noncontrast images. Lungs/Pleura: No pneumothorax. No pleural effusion. No acute consolidative airspace disease, lung masses or significant pulmonary nodules. Subcentimeter calcified basilar left lower lobe granuloma. Widespread patchy ground-glass opacities throughout both lungs. Patchy subpleural reticulation and mild interlobular septal thickening with  associated mild traction bronchiectasis and architectural distortion, most prominent in the lower lungs. Scattered mild honeycombing in the dependent lower lobes bilaterally, right greater than left, poorly evaluated due to motion degradation. No significant lobular air trapping or evidence of tracheobronchomalacia on the expiration sequence. Upper abdomen: Small hiatal hernia.  Cholecystectomy. Musculoskeletal: No aggressive appearing focal osseous lesions. Mild T4 vertebral compression fracture and severe T10 vertebral compression fracture of indeterminate chronicity, chronic appearing. Mild thoracic spondylosis. IMPRESSION: 1. Motion degraded scan, limiting assessment. 2. Mild cardiomegaly. 3. Patchy ground-glass opacity throughout both lungs. Mild traction bronchiectasis, architectural distortion and mild honeycombing, basilar predominant. Findings are suggestive of cardiogenic pulmonary edema superimposed on chronic basilar predominant fibrotic interstitial lung disease, potentially underlying UIP. Suggest attention on follow-up high-resolution chest CT study in 3-6 months. 4. Three-vessel coronary atherosclerosis. 5. Dilated main pulmonary artery, suggesting pulmonary arterial hypertension. 6. Mild mediastinal lymphadenopathy, nonspecific, probably reactive. 7. Small hiatal hernia. 8. Chronic appearing T4 and T10 vertebral compression fractures. 9. Aortic Atherosclerosis (ICD10-I70.0). Electronically Signed   By: Ilona Sorrel M.D.   On: 05/04/2021 17:09      Scheduled Meds: . amLODipine  10 mg Oral Daily  . enoxaparin (LOVENOX) injection  40 mg Subcutaneous Q24H  . furosemide  40 mg Intravenous Q12H  . gemfibrozil  600 mg Oral BID  . insulin aspart  0-9 Units Subcutaneous TID WC  . ipratropium-albuterol  3 mL Nebulization Q8H  . levothyroxine  100 mcg Oral QAC breakfast  . methylPREDNISolone (SOLU-MEDROL) injection  40 mg Intravenous Q12H  . quinapril  20 mg Oral Daily  . sertraline  50 mg Oral  Daily  . sodium chloride flush  3 mL Intravenous Q12H   Continuous Infusions:   LOS: 0 days      Time spent: 30 minutes   Dessa Phi, DO [facility] 05/05/2021, 2:31 PM   Available via Epic secure chat 7am-7pm After these hours, please refer to coverage provider listed on [email or web address]

## 2021-05-05 NOTE — Progress Notes (Signed)
Patient continues at a yellow MEWS; stable with no changes at this time. Will continue to obtain vitals q4h per MEWS protocol.    05/05/21 0842  Assess: MEWS Score  Temp 98.1 F (36.7 C)  BP 109/74  Pulse Rate (!) 116  Resp 19  SpO2 90 %  O2 Device Nasal Cannula  O2 Flow Rate (L/min) 3 L/min  Assess: MEWS Score  MEWS Temp 0  MEWS Systolic 0  MEWS Pulse 2  MEWS RR 0  MEWS LOC 0  MEWS Score 2  MEWS Score Color Yellow  Assess: if the MEWS score is Yellow or Red  Were vital signs taken at a resting state? Yes  Focused Assessment No change from prior assessment  Early Detection of Sepsis Score *See Row Information* Low  MEWS guidelines implemented *See Row Information* No, previously yellow, continue vital signs every 4 hours  Treat  MEWS Interventions Other (Comment) (assessed)  Pain Scale 0-10  Pain Score 0  Notify: Charge Nurse/RN  Name of Charge Nurse/RN Notified Danae Chen RN  Date Charge Nurse/RN Notified 05/05/21  Time Charge Nurse/RN Notified [phone]  Document  Progress note created (see row info) Yes

## 2021-05-05 NOTE — Progress Notes (Signed)
*  PRELIMINARY RESULTS* Echocardiogram 2D Echocardiogram has been performed.  Barbara [name] Barbara [name] 05/05/2021, 2:02 PM

## 2021-05-05 NOTE — Progress Notes (Signed)
Mobility Specialist - Progress Note   05/05/21 1100  Mobility  Activity Ambulated in hall  Level of Assistance Minimal assist, patient does 75% or more  Assistive Device None  Distance Ambulated (ft) 180 ft  Mobility Ambulated with assistance in hallway  Mobility Response Tolerated well  Mobility performed by Mobility specialist  $Mobility charge 1 Mobility    Pre-mobility: 94 HR, 92% SpO2 During mobility: 126 HR, 87% SpO2 Post-mobility: 112 HR, 93% SpO2   Pt lying in bed upon arrival, utilizing 3L. Mobility connected [place] to portable O2 set on 4L prior to OOB activity. Pt sat EOB with minA, denying dizziness. O2 desat to 86% as pt stood and began talking on the phone with HR peaking at 131 bpm. Pt sat back EOB with VS returning to pre-mobility readings. O2 increased to 6L for remainder of session. PLB educated and engaged. Pt ambulated in hallway without AD, denies use of RW PTA. MinA for steadying. Mild LOB x2, corrected by CGA. O2 ranging 86-90% during ambulation. Denied SOB throughout activity. Pt returned semi-supine to get sats > 90% once returned to room. Mild fatigue from activity. RPE 5/10 with breathing management reported as most difficult part of session. Pt back on 3L with sats at 92% prior to exit. RN notified.    Kathee Delton Mobility Specialist 05/05/21, 11:33 AM

## 2021-05-06 LAB — BASIC METABOLIC PANEL
Anion gap: 12 (ref 5–15)
BUN: 45 mg/dL — ABNORMAL HIGH (ref 8–23)
CO2: 24 mmol/L (ref 22–32)
Calcium: 9.3 mg/dL (ref 8.9–10.3)
Chloride: 102 mmol/L (ref 98–111)
Creatinine, Ser: 1.11 mg/dL — ABNORMAL HIGH (ref 0.44–1.00)
GFR, Estimated: 51 mL/min — ABNORMAL LOW (ref >60)
Glucose, Bld: 199 mg/dL — ABNORMAL HIGH (ref 70–99)
Potassium: 3.9 mmol/L (ref 3.5–5.1)
Sodium: 138 mmol/L (ref 135–145)

## 2021-05-06 LAB — ECHOCARDIOGRAM COMPLETE
AR max vel: 1.69 cm2
AV Area VTI: 1.86 cm2
AV Area mean vel: 1.77 cm2
AV Mean grad: 4 mmHg
AV Peak grad: 7.4 mmHg
Ao pk vel: 1.36 m/s
Area-P 1/2: 3.92 cm2
MV VTI: 1.83 cm2
S' Lateral: 3 cm
Weight: 2080 oz

## 2021-05-06 LAB — GLUCOSE, CAPILLARY
Glucose-Capillary: 163 mg/dL — ABNORMAL HIGH (ref 70–99)
Glucose-Capillary: 238 mg/dL — ABNORMAL HIGH (ref 70–99)
Glucose-Capillary: 198 mg/dL — ABNORMAL HIGH (ref 70–99)
Glucose-Capillary: 122 mg/dL — ABNORMAL HIGH (ref 70–99)

## 2021-05-06 LAB — HEMOGLOBIN A1C
Hgb A1c MFr Bld: 5.5 % (ref 4.8–5.6)
Mean Plasma Glucose: 111 mg/dL

## 2021-05-06 MED ORDER — IPRATROPIUM-ALBUTEROL 0.5-2.5 (3) MG/3ML IN SOLN
3.0000 mL | Freq: Three times a day (TID) | RESPIRATORY_TRACT | Status: DC
  Administered 2021-05-06 – 2021-05-07 (×2): 3 mL via RESPIRATORY_TRACT
  Filled 2021-05-06 (×2): qty 3

## 2021-05-06 NOTE — TOC Initial Note (Signed)
Transition of Care [facility]) - Initial/Assessment Note    Patient Details  Name: Barbara [name] MRN: [id number] Date of Birth: May 06, 1941  [facility] CM/SW Contact:    Candie Chroman, LCSW Phone Number: 05/06/2021, 2:57 PM  Clinical Narrative:  CSW met with patient. Sister/legal guardian Dedra Skeens and a friend later came into room. CSW introduced role and explained that discharge planning would be discussed. Patient lives home alone. PCP is Loura Pardon, MD at [facility]. Her sister drives her to appointments. [facility] [place]. No issues obtaining medications. No home health or DME use prior to admission. Patient is not on oxygen at home but is on 2 L here. Will follow for this potential need. Patient does not have a scale at home but sister has one she can use. Explained importance of weighing daily. No further concerns. CSW encouraged patient and her sister to contact CSW as needed. CSW will continue to follow patient and her sister for support and facilitate return home when stable.                Expected Discharge Plan: Home/Self Care Barriers to Discharge: Continued Medical Work up   Patient Goals and CMS Choice        Expected Discharge Plan and Services Expected Discharge Plan: Home/Self Care     Post Acute Care Choice: NA Living arrangements for the past 2 months: Single Family Home                                      Prior Living Arrangements/Services Living arrangements for the past 2 months: Single Family Home Lives with:: Self Patient language and need for interpreter reviewed:: Yes Do you feel safe going back to the place where you live?: Yes      Need for Family Participation in Patient Care: Yes (Comment)     Criminal Activity/Legal Involvement Pertinent to Current Situation/Hospitalization: No - Comment as needed  Activities of Daily Living Home Assistive Devices/Equipment: None ADL Screening (condition at time of  admission) Patient's cognitive ability adequate to safely complete daily activities?: Yes Is the patient deaf or have difficulty hearing?: No Does the patient have difficulty seeing, even when wearing glasses/contacts?: Yes Does the patient have difficulty concentrating, remembering, or making decisions?: No Patient able to express need for assistance with ADLs?: No Does the patient have difficulty dressing or bathing?: No Independently performs ADLs?: Yes (appropriate for developmental age) Does the patient have difficulty walking or climbing stairs?: No Weakness of Legs: None Weakness of Arms/Hands: None  Permission Sought/Granted Permission sought to share information with : Family Supports    Share Information with NAME: Arty Baumgartner     Permission granted to share info w Relationship: Sister/Legal Guardian  Permission granted to share info w Contact Information: [phone]  Emotional Assessment Appearance:: Appears stated age Attitude/Demeanor/Rapport: Engaged,Gracious Affect (typically observed): Accepting,Appropriate,Calm,Pleasant Orientation: : Oriented to Self,Oriented to Place,Oriented to  Time,Oriented to Situation Alcohol / Substance Use: Not Applicable Psych Involvement: No (comment)  Admission diagnosis:  Acute respiratory failure with hypoxia (HCC) [J96.01] Acute congestive heart failure, unspecified heart failure type [facility]) [I50.9] Patient Active Problem List   Diagnosis Date Noted  . Acute respiratory failure with hypoxia ([place]) 05/04/2021  . Elevated troponin 05/04/2021  . Acute sinusitis 04/28/2021  . Viral URI with cough 04/28/2021  . Hepatotoxicity due to statin drug 12/03/2020  .  Intertrigo 10/03/2018  . Vitamin D deficiency 03/20/2018  . Osteoporosis 09/25/2016  . Encounter for screening mammogram for breast cancer 09/13/2016  . Estrogen deficiency 09/13/2016  . History of shingles 07/27/2016  . Routine general medical examination at a health care  facility 07/08/2015  . Vaginal atrophy 07/01/2013  . Encounter for Medicare annual wellness exam 07/01/2013  . Colon cancer screening 06/26/2012  . Hypothyroid 06/18/2012  . Encounter for routine gynecological examination 02/15/2012  . Controlled type 2 diabetes mellitus without complication, without long-term current use of insulin ([place]) 06/05/2007  . Hyperlipidemia associated with type 2 diabetes mellitus ([place]) 06/05/2007  . Generalized anxiety disorder 06/05/2007  . EXPRESSIVE LANGUAGE DISORDER 06/05/2007  . ISCHEMIA, RETINAL 06/05/2007  . Hypertension associated with diabetes ([place]) 06/05/2007  . ALLERGIC RHINITIS 06/05/2007   PCP:  Abner Greenspan, MD Pharmacy:   [facility], [place], [address], [place] [address] Phone: [phone] Fax: [phone]  [id number] [place] [address] [place]Phone: [phone]: [phone]    Social Determinants of Health (SDOH) Interventions    Readmission Risk Interventions No flowsheet data found.

## 2021-05-06 NOTE — Progress Notes (Signed)
Mobility Specialist - Progress Note   05/06/21 1500  Mobility  Activity Ambulated in hall  Level of Assistance Minimal assist, patient does 75% or more  Assistive Device None  Distance Ambulated (ft) 180 ft  Mobility Ambulated with assistance in hallway  Mobility Response Tolerated well  Mobility performed by Mobility specialist  $Mobility charge 1 Mobility    Pre-mobility: 104 HR, 93% SpO2 During mobility: 115 HR, 89% SpO2 Post-mobility: 108 HR, 92% SpO2   Pt lying in bed upon arrival, utilizing 2L and bumped to 3L for OOB activity. Pt sat EOB with minA, no reports of dizziness. Pt began ambulation in hallway with O2 desatting to 85%. O2 increased to 4L for remainder of session with sats maintaining 89-90%. Denied SOB throughout session. Max HR 124 bpm. No LOB but mildly unsteady, requiring minA. Pt left in recliner upon return to room. Back on 2L prior to exit. Alarm set.    Kathee Delton Mobility Specialist 05/06/21, 3:26 PM

## 2021-05-06 NOTE — Progress Notes (Signed)
PROGRESS NOTE    Khayla Koppenhaver  XTG:[id number] DOB: 02-Nov-1941 DOA: 05/04/2021 PCP: Abner Greenspan, MD     Brief Narrative:  Kimya Mccahill is a 80 y.o. female with medical history significant for T2DM, HTN, HLD, hypothyroidism, anxiety, and cognitive impairment at baseline who presents to the ED for evaluation of cough, shortness of breath and new hypoxia at urgent care. Patient has about 2-1/2 weeks of increasing shortness of breath, cough, sore throat, sinus congestion, and rhinorrhea.  Denies any subjective fevers, chills, diaphoresis, chest pain, nausea, vomiting.  She initially went to urgent care and was found to be hypoxic with SPO2 86% on room air.  She was placed on 2 L supplemental O2 via [place] and sent to the ED for further evaluation. Chest x-ray shows patchy hazy and reticular opacities throughout both lungs changes suggestive of bronchiectasis in the lower lobes. High-resolution chest CT shows patchy groundglass opacity throughout both lungs, mild traction bronchiectasis, mild honeycombing.  Findings suggestive of cardiogenic pulmonary edema superimposed on chronic basilar predominant fibrotic interstitial lung disease. Dilated main pulmonary artery and three-vessel coronary atherosclerosis noted. Patient was given IV Lasix 40 mg once and the hospitalist service was consulted to admit for further evaluation and management.  New events last 24 hours / Subjective: Patient laying in bed, has no physical complaints today.  She remains on supplemental oxygen this morning without distress.  She denies any chest pain, shortness of breath, nausea or vomiting.  Assessment & Plan:   Principal Problem:   Acute respiratory failure with hypoxia (HCC) Active Problems:   Controlled type 2 diabetes mellitus without complication, without long-term current use of insulin ([place])   Hypertension associated with diabetes ([place])   Hypothyroid   Elevated troponin   Acute hypoxemic respiratory  failure secondary to CHF and ILD -Patient noted to be satting 86% on room air, placed on 4 L oxygen on admission -Remains on 3 L nasal cannula O2, wean nasal cannula O2 as able  ?ILD -Continue Solu-Medrol -CT chest with potential UIP. Follow up chest CT rec in 3-6 months   Pulmonary edema -BNP 413 -Echocardiogram showed EF 55 to 60%, normal LV function, no regional wall motion abnormality, LV diastolic parameters were normal, RV systolic function normal, RV size normal -Stop IV Lasix -Repeat chest x-ray in the morning  Demand ischemia -Secondary to above, patient without any chest pain complaints  Diabetes mellitus type 2, well controlled -Hemoglobin A1c 5.5 -Continue sliding scale insulin  Hypertension -Continue amlodipine, quinapril  Hypothyroidism -Continue Synthroid  Hyperlipidemia -Continue lopid   Depression/anxiety -Continue zoloft     DVT prophylaxis:  enoxaparin (LOVENOX) injection 40 mg Start: 05/04/21 2200  Code Status:     Code Status Orders  (From admission, onward)         Start     Ordered   05/04/21 1933  Full code  Continuous        05/04/21 1933        Code Status History    This patient has a current code status but no historical code status.   Advance Care Planning Activity     Family Communication: Called sister x 2, no answer and unable to leave voicemail Disposition Plan:  Status is: Inpatient  Remains inpatient appropriate because:Inpatient level of care appropriate due to severity of illness   Dispo: The patient is from: Home              Anticipated d/c is to: Home  Patient currently is not medically stable to d/c. Remains on O2   Difficult to place patient No     Consultants:   None  Procedures:   None   Antimicrobials:  Anti-infectives (From admission, onward)   None       Objective: Vitals:   05/06/21 0756 05/06/21 0823 05/06/21 1042 05/06/21 1103  BP:  121/65 110/72 122/73  Pulse:  100   (!) 108  Resp:  14    Temp:  (!) 97.5 F (36.4 C)  (!) 97.5 F (36.4 C)  TempSrc:  Oral  Oral  SpO2: 97% 96%  92%  Weight:        Intake/Output Summary (Last 24 hours) at 05/06/2021 1225 Last data filed at 05/06/2021 1014 Gross per 24 hour  Intake 600 ml  Output 500 ml  Net 100 ml   Filed Weights   05/04/21 2027 05/06/21 0500  Weight: 59 kg 56.6 kg    Examination: General exam: Appears calm and comfortable  Respiratory system: Bilateral crackles, on 3 L nasal cannula O2 Cardiovascular system: S1 & S2 heard, RRR. No pedal edema. Gastrointestinal system: Abdomen is nondistended, soft and nontender. Normal bowel sounds heard. Central nervous system: Alert  Extremities: Symmetric in appearance bilaterally  Skin: No rashes, lesions or ulcers on exposed skin  Psychiatry: Judgement and insight appear poor but stable  Data Reviewed: I have personally reviewed following labs and imaging studies  CBC: Recent Labs  Lab 05/04/21 1534 05/05/21 0555  WBC 12.0* 9.0  HGB 11.0* 10.3*  HCT 32.0* 29.8*  MCV 85.3 83.9  PLT 486* 644*   Basic Metabolic Panel: Recent Labs  Lab 05/04/21 1534 05/05/21 0555 05/06/21 0458  NA 135 138 138  K 4.3 3.6 3.9  CL 100 102 102  CO2 20* 23 24  GLUCOSE 124* 149* 199*  BUN 16 21 45*  CREATININE 0.76 0.81 1.11*  CALCIUM 9.9 9.4 9.3  MG  --  1.8  --    GFR: Estimated Creatinine Clearance: 33.3 mL/min (A) (by C-G formula based on SCr of 1.11 mg/dL (H)). Liver Function Tests: No results for input(s): AST, ALT, ALKPHOS, BILITOT, PROT, ALBUMIN in the last 168 hours. No results for input(s): LIPASE, AMYLASE in the last 168 hours. No results for input(s): AMMONIA in the last 168 hours. Coagulation Profile: No results for input(s): INR, PROTIME in the last 168 hours. Cardiac Enzymes: No results for input(s): CKTOTAL, CKMB, CKMBINDEX, TROPONINI in the last 168 hours. BNP (last 3 results) No results for input(s): PROBNP in the last 8760  hours. HbA1C: Recent Labs    05/05/21 0555  HGBA1C 5.5   CBG: Recent Labs  Lab 05/05/21 1143 05/05/21 1630 05/05/21 2114 05/06/21 0737 05/06/21 1113  GLUCAP 169* 214* 163* 163* 238*   Lipid Profile: No results for input(s): CHOL, HDL, LDLCALC, TRIG, CHOLHDL, LDLDIRECT in the last 72 hours. Thyroid Function Tests: No results for input(s): TSH, T4TOTAL, FREET4, T3FREE, THYROIDAB in the last 72 hours. Anemia Panel: No results for input(s): VITAMINB12, FOLATE, FERRITIN, TIBC, IRON, RETICCTPCT in the last 72 hours. Sepsis Labs: No results for input(s): PROCALCITON, LATICACIDVEN in the last 168 hours.  Recent Results (from the past 240 hour(s))  Novel Coronavirus, NAA ([facility])     Status: None   Collection Time: 04/28/21 12:00 AM   Specimen: Nasopharyngeal(NP) swabs in vial transport medium   Nasopharynge  Result Value Ref Range Status   SARS-CoV-2, NAA Comment Not Detected Final    Comment: We are unable  to reliably determine a result for the specimen due to the presence of PCR inhibitor(s) in the specimen submitted.  If clinically indicated, please recollect an additional specimen for testing. This nucleic acid amplification test was developed and its performance characteristics determined by [employer]. Nucleic acid amplification tests include RT-PCR and TMA. This test has not been FDA cleared or approved. This test has been authorized by FDA under an Emergency Use Authorization (EUA). This test is only authorized for the duration of time the declaration that circumstances exist justifying the authorization of the emergency use of in vitro diagnostic tests for detection of SARS-CoV-2 virus and/or diagnosis of COVID-19 infection under section 564(b)(1) of the Act, 21 U.S.C. 188CZY-6(A) (1), unless the authorization is terminated or revoked sooner. When diagnostic testing is negative, the possibility of a false negative result should be considere d in the  context of a patient's recent exposures and the presence of clinical signs and symptoms consistent with COVID-19. An individual without symptoms of COVID-19 and who is not shedding SARS-CoV-2 virus would expect to have a negative (not detected) result in this assay.   SARS CORONAVIRUS 2 (TAT 6-24 HRS) Nasopharyngeal Nasopharyngeal Swab     Status: None   Collection Time: 05/04/21  5:42 PM   Specimen: Nasopharyngeal Swab  Result Value Ref Range Status   SARS Coronavirus 2 NEGATIVE NEGATIVE Final    Comment: (NOTE) SARS-CoV-2 target nucleic acids are NOT DETECTED.  The SARS-CoV-2 RNA is generally detectable in upper and lower respiratory specimens during the acute phase of infection. Negative results do not preclude SARS-CoV-2 infection, do not rule out co-infections with other pathogens, and should not be used as the sole basis for treatment or other patient management decisions. Negative results must be combined with clinical observations, patient history, and epidemiological information. The expected result is Negative.  Fact Sheet for Patients: SugarRoll.be  Fact Sheet for Healthcare Providers: https://www.woods-mathews.com/  This test is not yet approved or cleared by the [place] FDA and  has been authorized for detection and/or diagnosis of SARS-CoV-2 by FDA under an Emergency Use Authorization (EUA). This EUA will remain  in effect (meaning this test can be used) for the duration of the COVID-19 declaration under Se ction 564(b)(1) of the Act, 21 U.S.C. section 360bbb-3(b)(1), unless the authorization is terminated or revoked sooner.  Performed at [facility], [place] [address], [place], [place] [address]       Radiology Studies: DG Chest 2 View  Result Date: 05/04/2021 CLINICAL DATA:  Dyspnea for few weeks EXAM: CHEST - 2 VIEW COMPARISON:  None. FINDINGS: Normal heart size. Normal mediastinal contour. No  pneumothorax. No pleural effusion. Patchy hazy and reticular opacities throughout both lungs with a basilar predominance. Suggestion of tram track opacities in the left greater than right lower lobes. Lower thoracic vertebral compression fracture of indeterminate chronicity. IMPRESSION: Patchy hazy and reticular opacities throughout both lungs with a basilar predominance, with suggestion of tram track opacities in the left greater than right lower lobes suggestive of bronchiectasis. Findings are suggestive of interstitial lung disease. Consider dedicated high-resolution chest CT for further evaluation. Electronically Signed   By: Ilona Sorrel M.D.   On: 05/04/2021 16:06   CT Chest High Resolution  Result Date: 05/04/2021 CLINICAL DATA:  Abnormal chest radiograph, dyspnea EXAM: CT CHEST WITHOUT CONTRAST TECHNIQUE: Multidetector CT imaging of the chest was performed following the standard protocol without intravenous contrast. High resolution imaging of the lungs, as well as inspiratory and expiratory  imaging, was performed. COMPARISON:  Chest radiograph from earlier today. FINDINGS: Motion degraded scan, limiting assessment. Cardiovascular: Mild cardiomegaly. No significant pericardial effusion/thickening. Three-vessel coronary atherosclerosis. Atherosclerotic nonaneurysmal thoracic aorta. Dilated main pulmonary artery (3.8 cm diameter). Mediastinum/Nodes: No discrete thyroid nodules. Unremarkable esophagus. No axillary adenopathy. Mildly enlarged 1.5 cm right paratracheal node (series 2/image 50). Mildly enlarged 1.3 cm subcarinal node (series 2/image 63). Mildly enlarged 1.0 cm left prevascular node (series 2/image 51). No discrete hilar adenopathy on these noncontrast images. Lungs/Pleura: No pneumothorax. No pleural effusion. No acute consolidative airspace disease, lung masses or significant pulmonary nodules. Subcentimeter calcified basilar left lower lobe granuloma. Widespread patchy ground-glass opacities  throughout both lungs. Patchy subpleural reticulation and mild interlobular septal thickening with associated mild traction bronchiectasis and architectural distortion, most prominent in the lower lungs. Scattered mild honeycombing in the dependent lower lobes bilaterally, right greater than left, poorly evaluated due to motion degradation. No significant lobular air trapping or evidence of tracheobronchomalacia on the expiration sequence. Upper abdomen: Small hiatal hernia.  Cholecystectomy. Musculoskeletal: No aggressive appearing focal osseous lesions. Mild T4 vertebral compression fracture and severe T10 vertebral compression fracture of indeterminate chronicity, chronic appearing. Mild thoracic spondylosis. IMPRESSION: 1. Motion degraded scan, limiting assessment. 2. Mild cardiomegaly. 3. Patchy ground-glass opacity throughout both lungs. Mild traction bronchiectasis, architectural distortion and mild honeycombing, basilar predominant. Findings are suggestive of cardiogenic pulmonary edema superimposed on chronic basilar predominant fibrotic interstitial lung disease, potentially underlying UIP. Suggest attention on follow-up high-resolution chest CT study in 3-6 months. 4. Three-vessel coronary atherosclerosis. 5. Dilated main pulmonary artery, suggesting pulmonary arterial hypertension. 6. Mild mediastinal lymphadenopathy, nonspecific, probably reactive. 7. Small hiatal hernia. 8. Chronic appearing T4 and T10 vertebral compression fractures. 9. Aortic Atherosclerosis (ICD10-I70.0). Electronically Signed   By: Ilona Sorrel M.D.   On: 05/04/2021 17:09   ECHOCARDIOGRAM COMPLETE  Result Date: 05/06/2021    ECHOCARDIOGRAM REPORT   Patient Name:   OKSANA DEBERRY Date of Exam: 05/05/2021 Medical Rec #:  [id number]           Height:       62.5 in Accession #:    [id number]          Weight:       130.0 lb Date of Birth:  March 05, 1941           BSA:          1.601 m Patient Age:    60 years            BP:            113/75 mmHg Patient Gender: F                   HR:           108 bpm. Exam Location:  [facility] Procedure: 2D Echo, Color Doppler and Cardiac Doppler Indications:     I50.9 Congestive Heart Failure  History:         Patient has no prior history of Echocardiogram examinations.                  Risk Factors:Hypertension, Diabetes and Dyslipidemia.  Sonographer:     Charmayne Sheer RDCS (AE) Referring Phys:  [id number] Cleaster Corin PATEL Diagnosing Phys: Yolonda Kida MD  Sonographer Comments: Suboptimal subcostal window. IMPRESSIONS  1. Left ventricular ejection fraction, by estimation, is 55 to 60%. The left ventricle has normal function. The left ventricle has no regional wall motion abnormalities. Left ventricular diastolic parameters  were normal.  2. Right ventricular systolic function is normal. The right ventricular size is normal.  3. The mitral valve is normal in structure. No evidence of mitral valve regurgitation.  4. The aortic valve is grossly normal. Aortic valve regurgitation is not visualized. FINDINGS  Left Ventricle: Left ventricular ejection fraction, by estimation, is 55 to 60%. The left ventricle has normal function. The left ventricle has no regional wall motion abnormalities. The left ventricular internal cavity size was normal in size. There is  no left ventricular hypertrophy. Left ventricular diastolic parameters were normal. Right Ventricle: The right ventricular size is normal. No increase in right ventricular wall thickness. Right ventricular systolic function is normal. Left Atrium: Left atrial size was normal in size. Right Atrium: Right atrial size was normal in size. Pericardium: There is no evidence of pericardial effusion. Mitral Valve: The mitral valve is normal in structure. No evidence of mitral valve regurgitation. MV peak gradient, 5.7 mmHg. The mean mitral valve gradient is 2.0 mmHg. Tricuspid Valve: The tricuspid valve is normal in structure. Tricuspid valve regurgitation is not  demonstrated. Aortic Valve: The aortic valve is grossly normal. Aortic valve regurgitation is not visualized. Aortic valve mean gradient measures 4.0 mmHg. Aortic valve peak gradient measures 7.4 mmHg. Aortic valve area, by VTI measures 1.86 cm. Pulmonic Valve: The pulmonic valve was normal in structure. Pulmonic valve regurgitation is not visualized. Aorta: The ascending aorta was not well visualized. IAS/Shunts: No atrial level shunt detected by color flow Doppler.  LEFT VENTRICLE PLAX 2D LVIDd:         4.20 cm  Diastology LVIDs:         3.00 cm  LV e' medial:    3.92 cm/s LV PW:         1.00 cm  LV E/e' medial:  24.2 LV IVS:        0.80 cm  LV e' lateral:   5.66 cm/s LVOT diam:     1.80 cm  LV E/e' lateral: 16.7 LV SV:         37 LV SV Index:   23 LVOT Area:     2.54 cm  RIGHT VENTRICLE RV Basal diam:  3.30 cm LEFT ATRIUM             Index       RIGHT ATRIUM           Index LA diam:        4.00 cm 2.50 cm/m  RA Area:     11.90 cm LA Vol (A2C):   26.2 ml 16.36 ml/m RA Volume:   27.20 ml  16.99 ml/m LA Vol (A4C):   47.6 ml 29.72 ml/m LA Biplane Vol: 38.3 ml 23.92 ml/m  AORTIC VALVE                   PULMONIC VALVE AV Area (Vmax):    1.69 cm    PV Vmax:       0.95 m/s AV Area (Vmean):   1.77 cm    PV Vmean:      61.700 cm/s AV Area (VTI):     1.86 cm    PV VTI:        0.153 m AV Vmax:           136.00 cm/s PV Peak grad:  3.6 mmHg AV Vmean:          90.200 cm/s PV Mean grad:  2.0 mmHg AV VTI:  0.198 m AV Peak Grad:      7.4 mmHg AV Mean Grad:      4.0 mmHg LVOT Vmax:         90.10 cm/s LVOT Vmean:        62.600 cm/s LVOT VTI:          0.145 m LVOT/AV VTI ratio: 0.73  AORTA Ao Root diam: 2.90 cm MITRAL VALVE MV Area (PHT): 3.92 cm    SHUNTS MV Area VTI:   1.83 cm    Systemic VTI:  0.14 m MV Peak grad:  5.7 mmHg    Systemic Diam: 1.80 cm MV Mean grad:  2.0 mmHg MV Vmax:       1.19 m/s MV Vmean:      72.5 cm/s MV Decel Time: 193 msec MV E velocity: 94.70 cm/s Yolonda Kida MD Electronically  signed by Yolonda Kida MD Signature Date/Time: 05/06/2021/9:16:05 AM    Final       Scheduled Meds: . amLODipine  10 mg Oral Daily  . enoxaparin (LOVENOX) injection  40 mg Subcutaneous Q24H  . gemfibrozil  600 mg Oral BID  . insulin aspart  0-9 Units Subcutaneous TID WC  . ipratropium-albuterol  3 mL Nebulization Q8H  . levothyroxine  100 mcg Oral QAC breakfast  . methylPREDNISolone (SOLU-MEDROL) injection  40 mg Intravenous Q12H  . quinapril  20 mg Oral Daily  . sertraline  50 mg Oral Daily  . sodium chloride flush  3 mL Intravenous Q12H   Continuous Infusions:   LOS: 1 day      Time spent: 30 minutes   Dessa Phi, DO [facility] 05/06/2021, 12:25 PM   Available via Epic secure chat 7am-7pm After these hours, please refer to coverage provider listed on [email or web address]

## 2021-05-06 NOTE — Progress Notes (Signed)
Mobility Specialist - Progress Note   05/06/21 1628  Mobility  Activity Ambulated in hall  Level of Assistance Minimal assist, patient does 75% or more  Assistive Device None  Distance Ambulated (ft) 180 ft  Mobility Ambulated with assistance in hallway  Mobility Response Tolerated well  Mobility performed by Mobility specialist  $Mobility charge 1 Mobility    Pre-mobility: 113 HR, 92% SpO2 During mobility: 128 HR, 88% SpO2 Post-mobility: 119 HR, 92% SPO2   Pt requesting to ambulate second time today. Sitting in recliner on arrival, using 2L. Bumped to 4L for ambulation. O2 mostly maintaining 88-90%, but did desat to a low of 85% x2 this attempt. 2 standing rest breaks initiated for breathing management. Voiced mild SOB. No s/s of distress. Pt left in recliner with alarm set.    Kathee Delton Mobility Specialist 05/06/21, 4:33 PM

## 2021-05-06 NOTE — Plan of Care (Signed)

## 2021-05-07 ENCOUNTER — Inpatient Hospital Stay: Payer: Medicare HMO

## 2021-05-07 LAB — GLUCOSE, CAPILLARY
Glucose-Capillary: 186 mg/dL — ABNORMAL HIGH (ref 70–99)
Glucose-Capillary: 183 mg/dL — ABNORMAL HIGH (ref 70–99)
Glucose-Capillary: 140 mg/dL — ABNORMAL HIGH (ref 70–99)
Glucose-Capillary: 114 mg/dL — ABNORMAL HIGH (ref 70–99)

## 2021-05-07 LAB — BASIC METABOLIC PANEL
Anion gap: 12 (ref 5–15)
BUN: 61 mg/dL — ABNORMAL HIGH (ref 8–23)
CO2: 24 mmol/L (ref 22–32)
Calcium: 9.1 mg/dL (ref 8.9–10.3)
Chloride: 101 mmol/L (ref 98–111)
Creatinine, Ser: 1.25 mg/dL — ABNORMAL HIGH (ref 0.44–1.00)
GFR, Estimated: 44 mL/min — ABNORMAL LOW (ref >60)
Glucose, Bld: 205 mg/dL — ABNORMAL HIGH (ref 70–99)
Potassium: 4.3 mmol/L (ref 3.5–5.1)
Sodium: 137 mmol/L (ref 135–145)

## 2021-05-07 MED ORDER — ENOXAPARIN SODIUM 30 MG/0.3ML IJ SOSY
30.0000 mg | PREFILLED_SYRINGE | INTRAMUSCULAR | Status: DC
  Administered 2021-05-07 – 2021-05-08 (×2): 30 mg via SUBCUTANEOUS
  Filled 2021-05-07 (×2): qty 0.3

## 2021-05-07 MED ORDER — IPRATROPIUM-ALBUTEROL 0.5-2.5 (3) MG/3ML IN SOLN
3.0000 mL | Freq: Two times a day (BID) | RESPIRATORY_TRACT | Status: DC
  Administered 2021-05-07 – 2021-05-09 (×4): 3 mL via RESPIRATORY_TRACT
  Filled 2021-05-07 (×4): qty 3

## 2021-05-07 NOTE — Progress Notes (Addendum)
PROGRESS NOTE    Barbara [name]  RKY:[id number] DOB: 01-05-1941 DOA: 05/04/2021 PCP: Abner Greenspan, MD     Brief Narrative:  Barbara [name] is a 80 y.o. female with medical history significant for T2DM, HTN, HLD, hypothyroidism, anxiety, and cognitive impairment at baseline who presents to the ED for evaluation of cough, shortness of breath and new hypoxia at urgent care. Patient has about 2-1/2 weeks of increasing shortness of breath, cough, sore throat, sinus congestion, and rhinorrhea.  Denies any subjective fevers, chills, diaphoresis, chest pain, nausea, vomiting.  She initially went to urgent care and was found to be hypoxic with SPO2 86% on room air.  She was placed on 2 L supplemental O2 via [place] and sent to the ED for further evaluation. Chest x-ray shows patchy hazy and reticular opacities throughout both lungs changes suggestive of bronchiectasis in the lower lobes. High-resolution chest CT shows patchy groundglass opacity throughout both lungs, mild traction bronchiectasis, mild honeycombing.  Findings suggestive of cardiogenic pulmonary edema superimposed on chronic basilar predominant fibrotic interstitial lung disease. Dilated main pulmonary artery and three-vessel coronary atherosclerosis noted. Patient was given IV Lasix 40 mg once and the hospitalist service was consulted to admit for further evaluation and management.  New events last 24 hours / Subjective: Patient sitting at bedside, eating breakfast. She states that she is feeling well, denies any worsening shortness of breath.  She remains on 2 L of oxygen.   Assessment & Plan:   Principal Problem:   Acute respiratory failure with hypoxia (HCC) Active Problems:   Controlled type 2 diabetes mellitus without complication, without long-term current use of insulin ([place])   Hypertension associated with diabetes ([place])   Hypothyroid   Elevated troponin   Acute hypoxemic respiratory failure secondary to pulmonary  edema superimposed on ILD -Patient noted to be satting 86% on room air, placed on 4 L oxygen on admission -Remains on 2 L nasal cannula O2, wean nasal cannula O2 as able  ?ILD -CT chest showed patchy groundglass opacities bilaterally, bronchiectasis, mild honeycombing.  Suggestive of cardiogenic pulmonary edema superimposed on chronic basilar predominant fibrotic interstitial lung disease, potentially underlying UIP -Continue Solu-Medrol -Repeat chest x-ray reviewed independently, showing diffuse interstitial opacities.  Radiology read as showing chronic ILD with probable superimposed pulmonary edema -Has not really improved much in the past several days, consult pulmonology today - Dr. Raul Del   Pulmonary edema -BNP 413 -Echocardiogram showed EF 55 to 60%, normal LV function, no regional wall motion abnormality, LV diastolic parameters were normal, RV systolic function normal, RV size normal -Patient does not look overtly fluid overloaded on examination.  IV Lasix was stopped due to increase in creatinine  AKI -Insetting of IV Lasix use.  Hold Lasix, ACE -Check renal ultrasound -Patient taking in p.o. adequately, will hold off on IV fluid in setting of pulmonary edema seen on imaging -Trend BMP  Demand ischemia -Secondary to above, patient without any chest pain complaints  Diabetes mellitus type 2, well controlled -Hemoglobin A1c 5.5 -Continue sliding scale insulin  Hypertension -Continue amlodipine  Hypothyroidism -Continue Synthroid  Hyperlipidemia -Continue lopid   Depression/anxiety -Continue zoloft     DVT prophylaxis:  enoxaparin (LOVENOX) injection 40 mg Start: 05/04/21 2200  Code Status:     Code Status Orders  (From admission, onward)           Start     Ordered   05/04/21 1933  Full code  Continuous  05/04/21 1933           Code Status History     This patient has a current code status but no historical code status.   Advance Care  Planning Activity      Family Communication: No family at bedside today Disposition Plan:  Status is: Inpatient  Remains inpatient appropriate because:Inpatient level of care appropriate due to severity of illness   Dispo: The patient is from: Home              Anticipated d/c is to: Home              Patient currently is not medically stable to d/c. Remains on O2   Difficult to place patient No     Consultants:   Pulmonology  Procedures:   None   Antimicrobials:  . Anti-infectives (From admission, onward)   .  Marland Kitchen None   .   Marland Kitchen    Objective: Vitals:   05/06/21 1949 05/06/21 2026 05/07/21 0400 05/07/21 0840  BP: 112/68  112/63 104/68  Pulse: 100  87 100  Resp: 17  17 18   Temp: 97.7 F (36.5 C)  97.8 F (36.6 C) 98 F (36.7 C)  TempSrc:   Oral Oral  SpO2: 95% 95% 97% 94%  Weight:        Intake/Output Summary (Last 24 hours) at 05/07/2021 0947 Last data filed at 05/06/2021 2014 Gross per 24 hour  Intake 720 ml  Output 400 ml  Net 320 ml   Filed Weights   05/04/21 2027 05/06/21 0500  Weight: 59 kg 56.6 kg    Examination: General exam: Appears calm and comfortable  Respiratory system: Bilateral crackles, respiratory effort is normal, on 2 L nasal cannula O2 Cardiovascular system: S1 & S2 heard, tachycardic, regular rhythm. No pedal edema. Gastrointestinal system: Abdomen is nondistended, soft and nontender. Normal bowel sounds heard. Central nervous system: Alert, nonfocal exam Extremities: Symmetric in appearance bilaterally  Skin: No rashes, lesions or ulcers on exposed skin  Psychiatry: Judgement and insight appear stable.  Data Reviewed: I have personally reviewed following labs and imaging studies  CBC: Recent Labs  Lab 05/04/21 1534 05/05/21 0555  WBC 12.0* 9.0  HGB 11.0* 10.3*  HCT 32.0* 29.8*  MCV 85.3 83.9  PLT 486* 034*   Basic Metabolic Panel: Recent Labs  Lab 05/04/21 1534 05/05/21 0555 05/06/21 0458 05/07/21 0541  NA 135  138 138 137  K 4.3 3.6 3.9 4.3  CL 100 102 102 101  CO2 20* 23 24 24   GLUCOSE 124* 149* 199* 205*  BUN 16 21 45* 61*  CREATININE 0.76 0.81 1.11* 1.25*  CALCIUM 9.9 9.4 9.3 9.1  MG  --  1.8  --   --    GFR: Estimated Creatinine Clearance: 29.6 mL/min (A) (by C-G formula based on SCr of 1.25 mg/dL (H)). Liver Function Tests: No results for input(s): AST, ALT, ALKPHOS, BILITOT, PROT, ALBUMIN in the last 168 hours. No results for input(s): LIPASE, AMYLASE in the last 168 hours. No results for input(s): AMMONIA in the last 168 hours. Coagulation Profile: No results for input(s): INR, PROTIME in the last 168 hours. Cardiac Enzymes: No results for input(s): CKTOTAL, CKMB, CKMBINDEX, TROPONINI in the last 168 hours. BNP (last 3 results) No results for input(s): PROBNP in the last 8760 hours. HbA1C: Recent Labs    05/05/21 0555  HGBA1C 5.5   CBG: Recent Labs  Lab 05/06/21 0737 05/06/21 1113 05/06/21 1608 05/06/21 2221 05/07/21  Enigma*   Lipid Profile: No results for input(s): CHOL, HDL, LDLCALC, TRIG, CHOLHDL, LDLDIRECT in the last 72 hours. Thyroid Function Tests: No results for input(s): TSH, T4TOTAL, FREET4, T3FREE, THYROIDAB in the last 72 hours. Anemia Panel: No results for input(s): VITAMINB12, FOLATE, FERRITIN, TIBC, IRON, RETICCTPCT in the last 72 hours. Sepsis Labs: No results for input(s): PROCALCITON, LATICACIDVEN in the last 168 hours.  Recent Results (from the past 240 hour(s))  Novel Coronavirus, NAA ([facility])     Status: None   Collection Time: 04/28/21 12:00 AM   Specimen: Nasopharyngeal(NP) swabs in vial transport medium   Nasopharynge  Result Value Ref Range Status   SARS-CoV-2, NAA Comment Not Detected Final    Comment: We are unable to reliably determine a result for the specimen due to the presence of PCR inhibitor(s) in the specimen submitted.  If clinically indicated, please recollect an additional specimen  for testing. This nucleic acid amplification test was developed and its performance characteristics determined by [employer]. Nucleic acid amplification tests include RT-PCR and TMA. This test has not been FDA cleared or approved. This test has been authorized by FDA under an Emergency Use Authorization (EUA). This test is only authorized for the duration of time the declaration that circumstances exist justifying the authorization of the emergency use of in vitro diagnostic tests for detection of SARS-CoV-2 virus and/or diagnosis of COVID-19 infection under section 564(b)(1) of the Act, 21 U.S.C. 836OQH-4(T) (1), unless the authorization is terminated or revoked sooner. When diagnostic testing is negative, the possibility of a false negative result should be considere d in the context of a patient's recent exposures and the presence of clinical signs and symptoms consistent with COVID-19. An individual without symptoms of COVID-19 and who is not shedding SARS-CoV-2 virus would expect to have a negative (not detected) result in this assay.   SARS CORONAVIRUS 2 (TAT 6-24 HRS) Nasopharyngeal Nasopharyngeal Swab     Status: None   Collection Time: 05/04/21  5:42 PM   Specimen: Nasopharyngeal Swab  Result Value Ref Range Status   SARS Coronavirus 2 NEGATIVE NEGATIVE Final    Comment: (NOTE) SARS-CoV-2 target nucleic acids are NOT DETECTED.  The SARS-CoV-2 RNA is generally detectable in upper and lower respiratory specimens during the acute phase of infection. Negative results do not preclude SARS-CoV-2 infection, do not rule out co-infections with other pathogens, and should not be used as the sole basis for treatment or other patient management decisions. Negative results must be combined with clinical observations, patient history, and epidemiological information. The expected result is Negative.  Fact Sheet for Patients: SugarRoll.be  Fact  Sheet for Healthcare Providers: https://www.woods-mathews.com/  This test is not yet approved or cleared by the [place] FDA and  has been authorized for detection and/or diagnosis of SARS-CoV-2 by FDA under an Emergency Use Authorization (EUA). This EUA will remain  in effect (meaning this test can be used) for the duration of the COVID-19 declaration under Se ction 564(b)(1) of the Act, 21 U.S.C. section 360bbb-3(b)(1), unless the authorization is terminated or revoked sooner.  Performed at [facility], [place] [address], [place], [place] [address]       Radiology Studies: DG Chest Port 1 View  Result Date: 05/07/2021 CLINICAL DATA:  Respiratory failure. EXAM: PORTABLE CHEST 1 VIEW COMPARISON:  CT chest and chest x-ray dated May 04, 2021. FINDINGS: Unchanged mild cardiomegaly. Similar appearing diffuse interstitial and hazy airspace opacities in both  lungs. No pneumothorax or large pleural effusion. IMPRESSION: 1. Similar appearing chronic interstitial lung disease with probable superimposed pulmonary edema. Electronically Signed   By: Titus Dubin M.D.   On: 05/07/2021 08:29   ECHOCARDIOGRAM COMPLETE  Result Date: 05/06/2021    ECHOCARDIOGRAM REPORT   Patient Name:   JAYDALYNN OLIVERO Date of Exam: 05/05/2021 Medical Rec #:  [id number]           Height:       62.5 in Accession #:    [id number]          Weight:       130.0 lb Date of Birth:  29-Jan-1941           BSA:          1.601 m Patient Age:    14 years            BP:           113/75 mmHg Patient Gender: F                   HR:           108 bpm. Exam Location:  [facility] Procedure: 2D Echo, Color Doppler and Cardiac Doppler Indications:     I50.9 Congestive Heart Failure  History:         Patient has no prior history of Echocardiogram examinations.                  Risk Factors:Hypertension, Diabetes and Dyslipidemia.  Sonographer:     Charmayne Sheer RDCS (AE) Referring Phys:  [id number] Cleaster Corin PATEL Diagnosing Phys: Yolonda Kida MD  Sonographer Comments: Suboptimal subcostal window. IMPRESSIONS  1. Left ventricular ejection fraction, by estimation, is 55 to 60%. The left ventricle has normal function. The left ventricle has no regional wall motion abnormalities. Left ventricular diastolic parameters were normal.  2. Right ventricular systolic function is normal. The right ventricular size is normal.  3. The mitral valve is normal in structure. No evidence of mitral valve regurgitation.  4. The aortic valve is grossly normal. Aortic valve regurgitation is not visualized. FINDINGS  Left Ventricle: Left ventricular ejection fraction, by estimation, is 55 to 60%. The left ventricle has normal function. The left ventricle has no regional wall motion abnormalities. The left ventricular internal cavity size was normal in size. There is  no left ventricular hypertrophy. Left ventricular diastolic parameters were normal. Right Ventricle: The right ventricular size is normal. No increase in right ventricular wall thickness. Right ventricular systolic function is normal. Left Atrium: Left atrial size was normal in size. Right Atrium: Right atrial size was normal in size. Pericardium: There is no evidence of pericardial effusion. Mitral Valve: The mitral valve is normal in structure. No evidence of mitral valve regurgitation. MV peak gradient, 5.7 mmHg. The mean mitral valve gradient is 2.0 mmHg. Tricuspid Valve: The tricuspid valve is normal in structure. Tricuspid valve regurgitation is not demonstrated. Aortic Valve: The aortic valve is grossly normal. Aortic valve regurgitation is not visualized. Aortic valve mean gradient measures 4.0 mmHg. Aortic valve peak gradient measures 7.4 mmHg. Aortic valve area, by VTI measures 1.86 cm. Pulmonic Valve: The pulmonic valve was normal in structure. Pulmonic valve regurgitation is not visualized. Aorta: The ascending aorta was not well visualized. IAS/Shunts: No atrial level shunt detected by  color flow Doppler.  LEFT VENTRICLE PLAX 2D LVIDd:         4.20 cm  Diastology LVIDs:  3.00 cm  LV e' medial:    3.92 cm/s LV PW:         1.00 cm  LV E/e' medial:  24.2 LV IVS:        0.80 cm  LV e' lateral:   5.66 cm/s LVOT diam:     1.80 cm  LV E/e' lateral: 16.7 LV SV:         37 LV SV Index:   23 LVOT Area:     2.54 cm  RIGHT VENTRICLE RV Basal diam:  3.30 cm LEFT ATRIUM             Index       RIGHT ATRIUM           Index LA diam:        4.00 cm 2.50 cm/m  RA Area:     11.90 cm LA Vol (A2C):   26.2 ml 16.36 ml/m RA Volume:   27.20 ml  16.99 ml/m LA Vol (A4C):   47.6 ml 29.72 ml/m LA Biplane Vol: 38.3 ml 23.92 ml/m  AORTIC VALVE                   PULMONIC VALVE AV Area (Vmax):    1.69 cm    PV Vmax:       0.95 m/s AV Area (Vmean):   1.77 cm    PV Vmean:      61.700 cm/s AV Area (VTI):     1.86 cm    PV VTI:        0.153 m AV Vmax:           136.00 cm/s PV Peak grad:  3.6 mmHg AV Vmean:          90.200 cm/s PV Mean grad:  2.0 mmHg AV VTI:            0.198 m AV Peak Grad:      7.4 mmHg AV Mean Grad:      4.0 mmHg LVOT Vmax:         90.10 cm/s LVOT Vmean:        62.600 cm/s LVOT VTI:          0.145 m LVOT/AV VTI ratio: 0.73  AORTA Ao Root diam: 2.90 cm MITRAL VALVE MV Area (PHT): 3.92 cm    SHUNTS MV Area VTI:   1.83 cm    Systemic VTI:  0.14 m MV Peak grad:  5.7 mmHg    Systemic Diam: 1.80 cm MV Mean grad:  2.0 mmHg MV Vmax:       1.19 m/s MV Vmean:      72.5 cm/s MV Decel Time: 193 msec MV E velocity: 94.70 cm/s Dwayne D Callwood MD Electronically signed by Yolonda Kida MD Signature Date/Time: 05/06/2021/9:16:05 AM    Final       Scheduled Meds: . amLODipine  10 mg Oral Daily  . enoxaparin (LOVENOX) injection  40 mg Subcutaneous Q24H  . gemfibrozil  600 mg Oral BID  . insulin aspart  0-9 Units Subcutaneous TID WC  . ipratropium-albuterol  3 mL Nebulization BID  . levothyroxine  100 mcg Oral QAC breakfast  . methylPREDNISolone (SOLU-MEDROL) injection  40 mg Intravenous Q12H  .  sertraline  50 mg Oral Daily  . sodium chloride flush  3 mL Intravenous Q12H   Continuous Infusions:    LOS: 2 days      Time spent: 30 minutes   Dessa Phi, DO [facility]  05/07/2021, 9:47 AM   Available via Epic secure chat 7am-7pm After these hours, please refer to coverage provider listed on [email or web address]

## 2021-05-07 NOTE — Progress Notes (Signed)
Mobility Specialist - Progress Note   05/07/21 1400  Mobility  Activity Transferred to/from [facility];Ambulated in hall  Level of Assistance Contact guard assist, steadying assist  Assistive Device None  Distance Ambulated (ft) 183 ft  Mobility Out of bed for toileting  Mobility Response Tolerated well  Mobility performed by Mobility specialist  $Mobility charge 1 Mobility    Pre-mobility: 115 HR, 96% SpO2 During mobility: 128 HR, 88% SpO2 Post-mobility: 115 HR, 92% SpO2   Pt transferred to [facility] for BM prior to ambulation. SPT to [facility]. No LOB. O2 does desat coming into upright position, but did improve to 96% on 2L once seated. MinA for peri-care. Pt ambulated in hallway with CGA using 3L. Noted LOB x1 d/t head-turning while conversing. O2 desat to 85% and was increased to 4L for remaining 60'. Mild SOB, PLB engaged. Pt returned to recliner, back on 2L, alarm set.    Kathee Delton Mobility Specialist 05/07/21, 3:48 PM

## 2021-05-07 NOTE — Progress Notes (Signed)
PHARMACIST - PHYSICIAN COMMUNICATION  CONCERNING:  Enoxaparin (Lovenox) for DVT Prophylaxis    RECOMMENDATION: Patient was prescribed enoxaprin 40mg  q24 hours for VTE prophylaxis.   Filed Weights   05/04/21 2027 05/06/21 0500  Weight: 59 kg (130 lb) 56.6 kg (124 lb 12.5 oz)    Body mass index is 22.46 kg/m.  Estimated Creatinine Clearance: 29.6 mL/min (A) (by C-G formula based on SCr of 1.25 mg/dL (H)).  Patient is candidate for enoxaparin 30mg  every 24 hours based on CrCl <79ml/min or Weight <45kg  DESCRIPTION: Pharmacy has adjusted enoxaparin dose per [facility] policy.  Patient is now receiving enoxaparin 30 mg every 24 hours    Dallie Piles, PharmD Clinical Pharmacist  05/07/2021 10:05 AM

## 2021-05-07 NOTE — Care Management Important Message (Signed)
Important Message  Patient Details  Name: Barbara [name] MRN: [id number] Date of Birth: [birth year]   Medicare Important Message Given:  N/A - LOS <3 / Initial given by admissions  Initial Medicare IM reviewed with patient by Brantley Fling, Patient Access Associate on 05/06/2021 at 8:30am.     [name] 05/07/2021, 8:13 AM

## 2021-05-07 NOTE — Progress Notes (Signed)
Mobility Specialist - Progress Note   05/07/21 1000  Mobility  Activity Transferred:  Bed to chair  Level of Assistance Standby assist, set-up cues, supervision of patient - no hands on  Assistive Device None  Distance Ambulated (ft) 2 ft  Mobility Out of bed to chair with meals  Mobility Response Tolerated well  Mobility performed by Mobility specialist  $Mobility charge 1 Mobility    Post-mobility: 104 HR, 92% SpO2   Pt requesting to transfer to recliner upon arrival. Pt politely declined ambulation at this time as pt had just returned to room from chest scan. Supervision to SPT to recliner without AD, no LOB. Assist for cord management. O2 desat to 86% during transfer on 2L, but rebounded to 92% once seated. Alarm set.    Kathee Delton Mobility Specialist 05/07/21, 11:02 AM

## 2021-05-08 LAB — BASIC METABOLIC PANEL
Anion gap: 10 (ref 5–15)
BUN: 54 mg/dL — ABNORMAL HIGH (ref 8–23)
CO2: 25 mmol/L (ref 22–32)
Calcium: 9.2 mg/dL (ref 8.9–10.3)
Chloride: 103 mmol/L (ref 98–111)
Creatinine, Ser: 0.98 mg/dL (ref 0.44–1.00)
GFR, Estimated: 59 mL/min — ABNORMAL LOW (ref >60)
Glucose, Bld: 203 mg/dL — ABNORMAL HIGH (ref 70–99)
Potassium: 4.6 mmol/L (ref 3.5–5.1)
Sodium: 138 mmol/L (ref 135–145)

## 2021-05-08 LAB — GLUCOSE, CAPILLARY
Glucose-Capillary: 185 mg/dL — ABNORMAL HIGH (ref 70–99)
Glucose-Capillary: 177 mg/dL — ABNORMAL HIGH (ref 70–99)
Glucose-Capillary: 142 mg/dL — ABNORMAL HIGH (ref 70–99)
Glucose-Capillary: 111 mg/dL — ABNORMAL HIGH (ref 70–99)

## 2021-05-08 LAB — RESPIRATORY PANEL BY PCR

## 2021-05-08 LAB — PROCALCITONIN: Procalcitonin: <0.1 ng/mL

## 2021-05-08 MED ORDER — VITAMIN B-12 1000 MCG PO TABS
1000.0000 ug | ORAL_TABLET | Freq: Every day | ORAL | Status: DC
  Administered 2021-05-08 – 2021-05-09 (×2): 1000 ug via ORAL
  Filled 2021-05-08 (×2): qty 1

## 2021-05-08 MED ORDER — LORATADINE 10 MG PO TABS
10.0000 mg | ORAL_TABLET | Freq: Every day | ORAL | Status: DC
  Administered 2021-05-08 – 2021-05-09 (×2): 10 mg via ORAL
  Filled 2021-05-08 (×2): qty 1

## 2021-05-08 MED ORDER — METFORMIN HCL 500 MG PO TABS: 500.0000 mg | ORAL_TABLET | Freq: Every day | ORAL | Status: DC

## 2021-05-08 MED ORDER — PANTOPRAZOLE SODIUM 40 MG PO TBEC
40.0000 mg | DELAYED_RELEASE_TABLET | Freq: Every day | ORAL | Status: DC
  Administered 2021-05-08 – 2021-05-09 (×2): 40 mg via ORAL
  Filled 2021-05-08 (×2): qty 1

## 2021-05-08 MED ORDER — METFORMIN HCL 500 MG PO TABS
500.0000 mg | ORAL_TABLET | Freq: Two times a day (BID) | ORAL | Status: DC
  Administered 2021-05-08 – 2021-05-09 (×2): 500 mg via ORAL
  Filled 2021-05-08 (×2): qty 1

## 2021-05-08 MED ORDER — CALCIUM CARBONATE-VITAMIN D 500-200 MG-UNIT PO TABS
1.0000 | ORAL_TABLET | Freq: Every day | ORAL | Status: DC
  Administered 2021-05-08 – 2021-05-09 (×2): 1 via ORAL
  Filled 2021-05-08 (×2): qty 1

## 2021-05-08 MED ORDER — FERROUS SULFATE 325 (65 FE) MG PO TABS
325.0000 mg | ORAL_TABLET | Freq: Every day | ORAL | Status: DC
  Administered 2021-05-09: 325 mg via ORAL
  Filled 2021-05-08: qty 1

## 2021-05-08 MED ORDER — FLUTICASONE PROPIONATE 50 MCG/ACT NA SUSP
2.0000 | Freq: Every day | NASAL | Status: DC | PRN
  Filled 2021-05-08: qty 16

## 2021-05-08 NOTE — Progress Notes (Signed)
92% resting on RA .Marland Kitchen 85% AMB on RA .. 4L for sats to maintain > 90% .. (86-88% on 2-3L)

## 2021-05-08 NOTE — Progress Notes (Signed)
   05/08/21 1226  Assess: MEWS Score  Temp 98.2 F (36.8 C)  BP 106/69  Pulse Rate (!) 110  Resp (!) 22  Level of Consciousness Alert  SpO2 93 %  O2 Device Nasal Cannula  Patient Activity (if Appropriate) In chair  O2 Flow Rate (L/min) 4 L/min  Assess: MEWS Score  MEWS Temp 0  MEWS Systolic 0  MEWS Pulse 1  MEWS RR 1  MEWS LOC 0  MEWS Score 2  MEWS Score Color Yellow  Assess: if the MEWS score is Yellow or Red  Were vital signs taken at a resting state? Yes  Focused Assessment Change from prior assessment (see assessment flowsheet)  Early Detection of Sepsis Score *See Row Information* Low  MEWS guidelines implemented *See Row Information* Yes  Treat  MEWS Interventions Escalated (See documentation below) (Escalated to yellow MEWS protocal monitoring)  Pain Scale 0-10  Pain Score 0  Take Vital Signs  Increase Vital Sign Frequency  Yellow: Q 2hr X 2 then Q 4hr X 2, if remains yellow, continue Q 4hrs  Escalate  MEWS: Escalate Yellow: discuss with charge nurse/RN and consider discussing with provider and RRT  Notify: Charge Nurse/RN  Name of Charge Nurse/RN Notified Raphael Gibney, RN  Date Charge Nurse/RN Notified 05/08/21  Time Charge Nurse/RN Notified 1231  Notify: Provider  Provider Name/Title Dr. Posey Pronto  Date Provider Notified 05/08/21  Time Provider Notified 1231  Notification Type Page  Notification Reason Change in status  Provider response Other (Comment) (Continue to monitor)  Date of Provider Response 05/08/21  Time of Provider Response 1300

## 2021-05-08 NOTE — Progress Notes (Addendum)
[place] [facility] [place] NAME: Barbara [name]    MR#:  [id number]  DATE OF BIRTH:  [birth year]  SUBJECTIVE:  patient sitting out in the chair. Ate good breakfast. She is requesting to go home. No family in the room. Breathing comfortably while sitting.   RN reported patient got tachycardic tachypnea after ambulating with PT  REVIEW OF SYSTEMS:   Review of Systems  Constitutional: Negative for chills, fever and weight loss.  HENT: Negative for ear discharge, ear pain and nosebleeds.   Eyes: Negative for blurred vision, pain and discharge.  Respiratory: Positive for shortness of breath. Negative for sputum production, wheezing and stridor.   Cardiovascular: Negative for chest pain, palpitations, orthopnea and PND.  Gastrointestinal: Negative for abdominal pain, diarrhea, nausea and vomiting.  Genitourinary: Negative for frequency and urgency.  Musculoskeletal: Negative for back pain and joint pain.  Neurological: Positive for weakness. Negative for sensory change, speech change and focal weakness.  Psychiatric/Behavioral: Negative for depression and hallucinations. The patient is not nervous/anxious.    Tolerating Diet: Tolerating PT:   DRUG ALLERGIES:   Allergies  Allergen Reactions  . Atorvastatin     REACTION: increased liver fxn tests  . Ezetimibe     REACTION: stomach upset/malaise  . Sulfonamide Derivatives     REACTION: rash    VITALS:  Blood pressure 106/69, pulse (!) 110, temperature 98.2 F (36.8 C), resp. rate (!) 22, weight 56.6 kg, SpO2 93 %.  PHYSICAL EXAMINATION:   Physical Exam  GENERAL:  80 y.o.-year-old patient lying in the bed with no acute distress. Obese Right eye corneal opacity LUNGS: decreasedbreath sounds bilaterally, no wheezing, rales, rhonchi. No use of accessory muscles of respiration.  CARDIOVASCULAR: S1, S2 normal. No murmurs, rubs, or gallops.  ABDOMEN: Soft, nontender, nondistended. Bowel sounds  present. No organomegaly or mass.  EXTREMITIES: No cyanosis, clubbing or edema b/l.    NEUROLOGIC: non focal PSYCHIATRIC:  patient is alert and oriented x 3.  SKIN: No obvious rash, lesion, or ulcer.   LABORATORY PANEL:  CBC Recent Labs  Lab 05/05/21 0555  WBC 9.0  HGB 10.3*  HCT 29.8*  PLT 444*    Chemistries  Recent Labs  Lab 05/05/21 0555 05/06/21 0458 05/08/21 0413  NA 138   < > 138  K 3.6   < > 4.6  CL 102   < > 103  CO2 23   < > 25  GLUCOSE 149*   < > 203*  BUN 21   < > 54*  CREATININE 0.81   < > 0.98  CALCIUM 9.4   < > 9.2  MG 1.8  --   --    < > = values in this interval not displayed.   Cardiac Enzymes No results for input(s): TROPONINI in the last 168 hours. RADIOLOGY:  US RENAL  Result Date: 05/07/2021 CLINICAL DATA:  Chronic kidney disease stage 3 B. EXAM: RENAL / URINARY TRACT ULTRASOUND COMPLETE COMPARISON:  None. FINDINGS: Right Kidney: Renal measurements: 10.0 x 5.4 x 5.3 cm = volume: 149 mL. Mildly increased echogenicity of renal parenchyma is noted. No mass or hydronephrosis visualized. Left Kidney: Renal measurements: 10.7 x 6.0 x 5.7 cm = volume: 192 mL. Mildly increased echogenicity of renal parenchyma is noted. No mass or hydronephrosis visualized. Bladder: Appears normal for degree of bladder distention. Other: None. IMPRESSION: Mildly increased echogenicity of renal parenchyma is noted suggesting medical renal disease. No hydronephrosis or renal obstruction is noted.  Electronically Signed   By: Marijo Conception M.D.   On: 05/07/2021 13:50   DG Chest Port 1 View  Result Date: 05/07/2021 CLINICAL DATA:  Respiratory failure. EXAM: PORTABLE CHEST 1 VIEW COMPARISON:  CT chest and chest x-ray dated May 04, 2021. FINDINGS: Unchanged mild cardiomegaly. Similar appearing diffuse interstitial and hazy airspace opacities in both lungs. No pneumothorax or large pleural effusion. IMPRESSION: 1. Similar appearing chronic interstitial lung disease with probable  superimposed pulmonary edema. Electronically Signed   By: Titus Dubin M.D.   On: 05/07/2021 08:29   ASSESSMENT AND PLAN:  Barbara [name] is a 80 y.o.femalewith medical history significant forT2DM, HTN, HLD, hypothyroidism, anxiety, and cognitive impairment at baseline who presents to the ED for evaluation ofcough, shortness of breath and new hypoxia at urgent care.    Acute hypoxemic respiratory failure secondary to pulmonary edema superimposed on ILD -Patient noted to be satting 86% on room air, placed on 4 L oxygen on admission -Remains on 2-3 L nasal cannula O2, wean nasal cannula O2 as able -- patient qualifies for home oxygen.  ?ILD -CT chest showed patchy groundglass opacities bilaterally, bronchiectasis, mild honeycombing.  Suggestive of cardiogenic pulmonary edema superimposed on chronic basilar predominant fibrotic interstitial lung disease, potentially underlying UIP -Continue Solu-Medrol -Repeat chest x-ray reviewed independently, showing diffuse interstitial opacities.  Radiology read as showing chronic ILD with probable superimposed pulmonary edema - pulmonology consulted today - Dr. Raul Del  paged-- not return my page --addendum: d/w case with Dr Lanney Gins (covering for dr fleming)--check procalcitonin and RVP  Pulmonary edema--mild -BNP 413 -Echocardiogram showed EF 55 to 60%, normal LV function, no regional wall motion abnormality, LV diastolic parameters were normal, RV systolic function normal, RV size normal -Patient does not look overtly fluid overloaded on examination.  IV Lasix was stopped due to increase in creatinine  AKI -Insetting of IV Lasix use.  Hold Lasix, ACE -Check renal ultrasound -Patient taking in p.o. adequately, will hold off on IV fluid in setting of pulmonary edema seen on imaging -Trend BMP  Demand ischemia -Secondary to above, patient without any chest pain complaints  Diabetes mellitus type 2, overall well  controlled hypoglycemia secondary to steroids -Hemoglobin A1c 5.5 -Continue sliding scale insulin -- continue metformin  Hypertension -Continue amlodipine  Hypothyroidism -Continue Synthroid  Hyperlipidemia -Continue lopid   Depression/anxiety -Continue zoloft     DVT prophylaxis:  enoxaparin (LOVENOX) injection 40 mg Start: 05/04/21 2200  Code Status:          Procedures: Family communication : sister Arty Baumgartner on the phone Consults : pulmonary CODE STATUS: full DVT Prophylaxis : Lovenox Level of care: Med-Surg Status is: Inpatient  Remains inpatient appropriate because:Inpatient level of care appropriate due to severity of illness   Dispo: The patient is from: Home              Anticipated d/c is to: Home              Patient currently is not medically stable to d/c.   Difficult to place patient No  Patient became tachycardic and tachypnea after ambulating with mobility therapist. Will continue steroids oxygen and monitor for another day. TOC for home oxygen      TOTAL TIME TAKING CARE OF THIS PATIENT: 25 minutes.  >50% time spent on counselling and coordination of care  Note: This dictation was prepared with Dragon dictation along with smaller phrase technology. Any transcriptional errors that result from this process are unintentional.  Ramla Hase  Posey Pronto M.D    [facility]   CC: Primary care physician; Tower, Wynelle Fanny, MDPatient ID: Barbara [name], female   DOB: [birth year], 80 y.o.   MRN: [id number]

## 2021-05-08 NOTE — Progress Notes (Signed)
Mobility Specialist - Progress Note   05/08/21 1100  Mobility  Activity Ambulated in hall  Level of Assistance Contact guard assist, steadying assist  Assistive Device None  Distance Ambulated (ft) 180 ft  Mobility Ambulated with assistance in hallway  Mobility Response Tolerated well  Mobility performed by Mobility specialist  $Mobility charge 1 Mobility    O2 while resting on RA = 92% O2 while AMB on RA = 85% O2 while AMB on 4L = 91%   Pt agreeable to trial ambulation on RA this date, sats monitored and recorded above. Pt denied SOB throughout session. Does desat to 87% while coming into upright position, PLB engaged to rebound sats to 92% prior to further activity. O2 gradually increased until sats maintained >/= 90%. No LOB without AD, CGA required. RR did peak at 46 during ambulation on RA. No s/s of distress.    Kathee Delton Mobility Specialist 05/08/21, 11:20 AM

## 2021-05-08 NOTE — TOC Progression Note (Signed)
Transition of Care [facility]) - Progression Note    Patient Details  Name: Barbara [name] MRN: [id number] Date of Birth: 17-Apr-1941  Transition of Care [facility]) CM/SW [place], LCSW Phone Number: 05/08/2021, 12:03 PM  Clinical Narrative:   Home o2 referral made to [employer].    Expected Discharge Plan: Home/Self Care Barriers to Discharge: Continued Medical Work up  Expected Discharge Plan and Services Expected Discharge Plan: Home/Self Care     Post Acute Care Choice: NA Living arrangements for the past 2 months: Single Family Home                                       Social Determinants of Health (SDOH) Interventions    Readmission Risk Interventions No flowsheet data found.

## 2021-05-09 LAB — GLUCOSE, CAPILLARY
Glucose-Capillary: 151 mg/dL — ABNORMAL HIGH (ref 70–99)
Glucose-Capillary: 172 mg/dL — ABNORMAL HIGH (ref 70–99)

## 2021-05-09 MED ORDER — ALBUTEROL SULFATE HFA 108 (90 BASE) MCG/ACT IN AERS: 2.0000 | INHALATION_SPRAY | Freq: Four times a day (QID) | RESPIRATORY_TRACT | 2 refills | Status: AC | PRN

## 2021-05-09 MED ORDER — PREDNISONE 20 MG PO TABS: 50.0000 mg | ORAL_TABLET | Freq: Every day | ORAL | Status: DC

## 2021-05-09 MED ORDER — PREDNISONE 10 MG PO TABS: 50.0000 mg | ORAL_TABLET | Freq: Every day | ORAL | 0 refills | Status: AC

## 2021-05-09 NOTE — Discharge Instructions (Signed)
Use your oxygen as instructed. Use your inhalers per instruction.

## 2021-05-09 NOTE — Discharge Summary (Signed)
[place] at [place] NAME: Barbara [name]    MR#:  [id number]  DATE OF BIRTH:  November 27, 1941  DATE OF ADMISSION:  05/04/2021 ADMITTING PHYSICIAN: Dessa Phi, DO  DATE OF DISCHARGE: 05/09/2021  PRIMARY CARE PHYSICIAN: Tower, Wynelle Fanny, MD    ADMISSION DIAGNOSIS:  Acute respiratory failure with hypoxia ([place]) [J96.01] Acute congestive heart failure, unspecified heart failure type ([place]) [I50.9]  DISCHARGE DIAGNOSIS:  Acute hypoxic respiratory failure suspected secondary to interstitial lung disease superimposed with acute congestive heart failure diastolic  SECONDARY DIAGNOSIS:   Past Medical History:  Diagnosis Date  . Allergy   . Anemia   . Anxiety   . Diabetes mellitus   . Expressive language disorder   . GERD (gastroesophageal reflux disease)   . Hyperlipidemia   . Hypertension   . Learning disability   . Mentally disabled   . Obesity   . Retinal ischemia    history of    HOSPITAL COURSE:  Barbara Boeh Brownis a 80 y.o.femalewith medical history significant forT2DM, HTN, HLD, hypothyroidism, anxiety, and cognitive impairment at baseline who presents to the ED for evaluation ofcough, shortness of breath and new hypoxia at urgent care.    Acute hypoxemic respiratory failure secondary topulmonary edema superimposed onILD -Patient noted to be satting 86% on room air, placed on 4 L oxygen on admission -Remains on2-3L nasal cannula O2, wean nasal cannula O2 as able -- patient qualifies for home oxygen.  ?ILD -CT chestshowed patchy groundglass opacities bilaterally, bronchiectasis, mild honeycombing. Suggestive of cardiogenic pulmonary edema superimposed on chronic basilar predominant fibrotic interstitial lung disease, potentially underlying UIP -received Solu-Medrol--change to oral steroid taper -Repeat chest x-rayreviewed independently, showing diffuse interstitial opacities. Radiology read as showing chronic ILD  with probable superimposed pulmonary edema --d/w case with Dr Lanney Gins (covering for dr fleming)--pt eill f/u as out pt -- procalcitonin --negative and RVP negative  Pulmonary edema--mild -BNP 413 -Echocardiogram showed EF 55 to 60%, normal LV function, no regional wall motion abnormality, LV diastolic parameters were normal, RV systolic function normal, RV size normal -Patient does not look overtly fluid overloaded on examination. IV Lasix was stopped due to increase in creatinine  AKI -Insetting of IV Lasix use. Hold Lasix, ACE -creat 0.98 at d/c  Diabetes mellitus type 2, overall well controlled hyperglycemia secondary to steroids -Hemoglobin A1c 5.5 -Continue sliding scale insulin -- continue metformin  Hypertension -Continue amlodipine  Hypothyroidism -Continue Synthroid  Hyperlipidemia -Continue lopid   Depression/anxiety -Continue zoloft     DVT prophylaxis: enoxaparin (LOVENOX) injection 40 mg Start: 05/04/21 2200  Code Status:         Procedures: Family communication : sister Rose on the phone 6/5--discussed d/c plans Consults : pulmonary CODE STATUS: full DVT Prophylaxis : Lovenox Level of care: Med-Surg Status is: Inpatient  Remains inpatient appropriate because:Inpatient level of care appropriate due to severity of illness   Dispo: The patient is from: Home  Anticipated d/c is to: Home with oxygen  Patient currently is  medically best optimized to d/c.              Difficult to place patient No  Ambulated well with Mobility therapist on saturday  CONSULTS OBTAINED:    DRUG ALLERGIES:   Allergies  Allergen Reactions  . Atorvastatin     REACTION: increased liver fxn tests  . Ezetimibe     REACTION: stomach upset/malaise  . Sulfonamide Derivatives     REACTION: rash    DISCHARGE MEDICATIONS:  Allergies as of 05/09/2021      Reactions   Atorvastatin    REACTION: increased liver fxn  tests   Ezetimibe    REACTION: stomach upset/malaise   Sulfonamide Derivatives    REACTION: rash      Medication List    TAKE these medications   acetaminophen 500 MG tablet Commonly known as: TYLENOL Take 500-1,000 mg by mouth every 6 (six) hours as needed for mild pain or moderate pain.   albuterol 108 (90 Base) MCG/ACT inhaler Commonly known as: VENTOLIN HFA Inhale 2 puffs into the lungs every 6 (six) hours as needed for wheezing or shortness of breath.   alendronate 70 MG tablet Commonly known as: FOSAMAX TAKE 1 TABLET BY MOUTH 30 MINUTES BEFOREBREAKFAST ONCE A WEEK WITH ATLEAST 8 OZ. OF WATER. What changed:   how much to take  how to take this  when to take this  additional instructions   amLODipine 10 MG tablet Commonly known as: NORVASC Take 1 tablet (10 mg total) by mouth daily.   Calcium Carb-Cholecalciferol 500-400 MG-UNIT Chew Take 1 tablet by mouth daily.   clotrimazole 1 % cream Commonly known as: LOTRIMIN Apply 1 application topically 2 (two) times daily. To itchy genital areas What changed:   when to take this  reasons to take this  additional instructions   ferrous sulfate 325 (65 FE) MG tablet Take 1 tablet (325 mg total) by mouth daily with breakfast.   fexofenadine 180 MG tablet Commonly known as: ALLEGRA Take 180 mg by mouth daily as needed for allergies.   fluticasone 50 MCG/ACT nasal spray Commonly known as: FLONASE USE TWO SPRAYS IN EACH NOSTRIL DAILY What changed:   how much to take  how to take this  when to take this  reasons to take this  additional instructions   gemfibrozil 600 MG tablet Commonly known as: LOPID Take 1 tablet (600 mg total) by mouth 2 (two) times daily.   levothyroxine 100 MCG tablet Commonly known as: SYNTHROID TAKE 1 TABLET BY MOUTH DAILY BEFORE BREAKFAST. What changed:   how much to take  how to take this  when to take this  additional instructions   metFORMIN 500 MG  tablet Commonly known as: GLUCOPHAGE TAKE 1 TABLET BY MOUTH EVERY DAY WITH BREAKFAST What changed:   how much to take  how to take this  when to take this  additional instructions   nystatin powder Commonly known as: MYCOSTATIN/NYSTOP Apply topically 2 (two) times daily as needed. To itchy rash under breasts What changed:   how much to take  reasons to take this  additional instructions   omeprazole 20 MG capsule Commonly known as: PRILOSEC TAKE ONE CAPSULE BY MOUTH DAILY BEFORE BREAKFAST What changed:   how much to take  how to take this  when to take this  additional instructions   predniSONE 10 MG tablet Commonly known as: DELTASONE Take 5 tablets (50 mg total) by mouth daily with breakfast. 50 mg daily taper by 10 mg daily then stop Start taking on: May 10, 2021   quinapril 40 MG tablet Commonly known as: ACCUPRIL TAKE ONE-HALF (0.5) BY MOUTH DAILY What changed:   how much to take  how to take this  when to take this  additional instructions   sertraline 50 MG tablet Commonly known as: ZOLOFT Take 1 tablet (50 mg total) by mouth daily.   triamcinolone cream 0.1 % Commonly known as: KENALOG Apply 1 application topically 2 (two)  times daily. What changed:   when to take this  reasons to take this   vitamin B-12 1000 MCG tablet Commonly known as: CYANOCOBALAMIN Take 1 tablet (1,000 mcg total) by mouth daily.            Durable Medical Equipment  (From admission, onward)         Start     Ordered   05/09/21 1015  For home use only DME oxygen  Once       Question Answer Comment  Length of Need Lifetime   Mode or (Route) Nasal cannula   Liters per Minute 2   Frequency Continuous (stationary and portable oxygen unit needed)   Oxygen conserving device Yes   Oxygen delivery system Gas      05/09/21 1015   05/08/21 1158  For home use only DME oxygen  Once       Question Answer Comment  Length of Need Lifetime   Mode or (Route)  Nasal cannula   Liters per Minute 3   Frequency Continuous (stationary and portable oxygen unit needed)   Oxygen conserving device Yes   Oxygen delivery system Gas      05/08/21 1157   05/08/21 1158  For home use only DME Nebulizer machine  Once       Question Answer Comment  Patient needs a nebulizer to treat with the following condition Pulmonary fibrosis ([place])   Length of Need 12 Months      05/08/21 1158          If you experience worsening of your admission symptoms, develop shortness of breath, life threatening emergency, suicidal or homicidal thoughts you must seek medical attention immediately by calling 911 or calling your MD immediately  if symptoms less severe.  You Must read complete instructions/literature along with all the possible adverse reactions/side effects for all the Medicines you take and that have been prescribed to you. Take any new Medicines after you have completely understood and accept all the possible adverse reactions/side effects.   Please note  You were cared for by a hospitalist during your hospital stay. If you have any questions about your discharge medications or the care you received while you were in the hospital after you are discharged, you can call the unit and asked to speak with the hospitalist on call if the hospitalist that took care of you is not available. Once you are discharged, your primary care physician will handle any further medical issues. Please note that NO REFILLS for any discharge medications will be authorized once you are discharged, as it is imperative that you return to your primary care physician (or establish a relationship with a primary care physician if you do not have one) for your aftercare needs so that they can reassess your need for medications and monitor your lab values. Today   SUBJECTIVE   Out int he chair. doning well. Eager to go home. No respiratory distress  VITAL SIGNS:  Blood pressure 118/76, pulse (!)  104, temperature 97.6 F (36.4 C), resp. rate 20, weight 56.5 kg, SpO2 95 %.  I/O:    Intake/Output Summary (Last 24 hours) at 05/09/2021 1224 Last data filed at 05/09/2021 9563 Gross per 24 hour  Intake 360 ml  Output 400 ml  Net -40 ml    PHYSICAL EXAMINATION:   GENERAL:  80 y.o.-year-old patient lying in the bed with no acute distress. Obese Right eye corneal opacity LUNGS: decreasedbreath sounds bilaterally, no wheezing, rales, rhonchi.  No use of accessory muscles of respiration.  CARDIOVASCULAR: S1, S2 normal. No murmurs, rubs, or gallops.  ABDOMEN: Soft, nontender, nondistended. Bowel sounds present. No organomegaly or mass.  EXTREMITIES: No cyanosis, clubbing or edema b/l.    NEUROLOGIC: non focal PSYCHIATRIC:  patient is alert and oriented x 3.  SKIN: No obvious rash, lesion, or ulcer.   DATA REVIEW:   CBC  Recent Labs  Lab 05/05/21 0555  WBC 9.0  HGB 10.3*  HCT 29.8*  PLT 444*    Chemistries  Recent Labs  Lab 05/05/21 0555 05/06/21 0458 05/08/21 0413  NA 138   < > 138  K 3.6   < > 4.6  CL 102   < > 103  CO2 23   < > 25  GLUCOSE 149*   < > 203*  BUN 21   < > 54*  CREATININE 0.81   < > 0.98  CALCIUM 9.4   < > 9.2  MG 1.8  --   --    < > = values in this interval not displayed.    Microbiology Results   Recent Results (from the past 240 hour(s))  SARS CORONAVIRUS 2 (TAT 6-24 HRS) Nasopharyngeal Nasopharyngeal Swab     Status: None   Collection Time: 05/04/21  5:42 PM   Specimen: Nasopharyngeal Swab  Result Value Ref Range Status   SARS Coronavirus 2 NEGATIVE NEGATIVE Final    Comment: (NOTE) SARS-CoV-2 target nucleic acids are NOT DETECTED.  The SARS-CoV-2 RNA is generally detectable in upper and lower respiratory specimens during the acute phase of infection. Negative results do not preclude SARS-CoV-2 infection, do not rule out co-infections with other pathogens, and should not be used as the sole basis for treatment or other patient  management decisions. Negative results must be combined with clinical observations, patient history, and epidemiological information. The expected result is Negative.  Fact Sheet for Patients: SugarRoll.be  Fact Sheet for Healthcare Providers: https://www.woods-mathews.com/  This test is not yet approved or cleared by the [place] FDA and  has been authorized for detection and/or diagnosis of SARS-CoV-2 by FDA under an Emergency Use Authorization (EUA). This EUA will remain  in effect (meaning this test can be used) for the duration of the COVID-19 declaration under Se ction 564(b)(1) of the Act, 21 U.S.C. section 360bbb-3(b)(1), unless the authorization is terminated or revoked sooner.  Performed at [facility], [place] [address] [place], [place] [address]   Respiratory (~20 pathogens) panel by PCR     Status: None   Collection Time: 05/08/21  2:37 PM   Specimen: Nasopharyngeal Swab; Respiratory  Result Value Ref Range Status   Adenovirus NOT DETECTED NOT DETECTED Final   Coronavirus 229E NOT DETECTED NOT DETECTED Final    Comment: (NOTE) The Coronavirus on the Respiratory Panel, DOES NOT test for the novel  Coronavirus (2019 nCoV)    Coronavirus HKU1 NOT DETECTED NOT DETECTED Final   Coronavirus NL63 NOT DETECTED NOT DETECTED Final   Coronavirus OC43 NOT DETECTED NOT DETECTED Final   Metapneumovirus NOT DETECTED NOT DETECTED Final   Rhinovirus / Enterovirus NOT DETECTED NOT DETECTED Final   Influenza A NOT DETECTED NOT DETECTED Final   Influenza B NOT DETECTED NOT DETECTED Final   Parainfluenza Virus 1 NOT DETECTED NOT DETECTED Final   Parainfluenza Virus 2 NOT DETECTED NOT DETECTED Final   Parainfluenza Virus 3 NOT DETECTED NOT DETECTED Final   Parainfluenza Virus 4 NOT DETECTED NOT DETECTED Final  Respiratory Syncytial Virus NOT DETECTED NOT DETECTED Final   Bordetella pertussis NOT DETECTED NOT DETECTED Final    Bordetella Parapertussis NOT DETECTED NOT DETECTED Final   Chlamydophila pneumoniae NOT DETECTED NOT DETECTED Final   Mycoplasma pneumoniae NOT DETECTED NOT DETECTED Final    Comment: Performed at [facility], [place] [address] [place], [place] [phone]    RADIOLOGY:  No results found.   CODE STATUS:     Code Status Orders  (From admission, onward)         Start     Ordered   05/04/21 1933  Full code  Continuous        05/04/21 1933        Code Status History    This patient has a current code status but no historical code status.   Advance Care Planning Activity       TOTAL TIME TAKING CARE OF THIS PATIENT: *35* minutes.    Fritzi Mandes M.D  [facility]    CC: Primary care physician; Tower, Wynelle Fanny, MD

## 2021-05-09 NOTE — TOC Progression Note (Signed)
Transition of Care [facility]) - Progression Note    Patient Details  Name: Barbara [name] MRN: [id number] Date of Birth: 1941-09-25  Transition of Care [facility]) CM/SW [place], LCSW Phone Number: 05/09/2021, 11:21 AM  Clinical Narrative:   Confirmed with [employer] Representative Jermaine that o2 was delivered to bedside on 6/4.    Expected Discharge Plan: Home/Self Care Barriers to Discharge: Continued Medical Work up  Expected Discharge Plan and Services Expected Discharge Plan: Home/Self Care     Post Acute Care Choice: NA Living arrangements for the past 2 months: Single Family Home                                       Social Determinants of Health (SDOH) Interventions    Readmission Risk Interventions No flowsheet data found.

## 2021-05-09 NOTE — Progress Notes (Signed)
Pt's PIV removed with tip intact.Pt to pick up meds from home pharmacy. Pts sister (guardian) at bedside. AVS summary explained in detail with pt and sister. Pt going home on 2 L 02. Pt and sister shown how to use 02 tank and nasal cannula. Pt and sister agree with plan of care and treatment. Pt to follow up with primary care in 1 week and pulmonology.

## 2021-05-10 ENCOUNTER — Telehealth: Payer: Self-pay

## 2021-05-10 DIAGNOSIS — J9601 Acute respiratory failure with hypoxia: Secondary | ICD-10-CM | POA: Diagnosis not present

## 2021-05-10 NOTE — Telephone Encounter (Signed)
Transition Care Management Follow-up Telephone Call  Date of discharge and from where: 05/09/2021, [facility]  How have you been since you were released from the hospital? Patient is doing better. She has no complaints at this time.   Any questions or concerns? No  Items Reviewed:  Did the pt receive and understand the discharge instructions provided? Yes   Medications obtained and verified? Yes   Other? No   Any new allergies since your discharge? No   Dietary orders reviewed? Yes  Do you have support at home? Yes   Home Care and Equipment/Supplies: Were home health services ordered? not applicable If so, what is the name of the agency? N/A  Has the agency set up a time to come to the patient's home? not applicable Were any new equipment or medical supplies ordered?  No What is the name of the medical supply agency? N/A Were you able to get the supplies/equipment? not applicable Do you have any questions related to the use of the equipment or supplies? No  Functional Questionnaire: (I = Independent and D = Dependent) ADLs: I  Bathing/Dressing- I  Meal Prep- I  Eating- I  Maintaining continence- I  Transferring/Ambulation- I  Managing Meds- I  Follow up appointments reviewed:   PCP Hospital f/u appt confirmed? No  Patient states that she will have her son call the office and help schedule an appointment for when he will be able to bring her.   [facility] f/u appt confirmed? follow up with pulmonary   Are transportation arrangements needed? No   If their condition worsens, is the pt aware to call PCP or go to the Emergency Dept.? Yes  Was the patient provided with contact information for the PCP's office or ED? Yes  Was to pt encouraged to call back with questions or concerns? Yes

## 2021-05-17 ENCOUNTER — Ambulatory Visit (INDEPENDENT_AMBULATORY_CARE_PROVIDER_SITE_OTHER): Payer: Medicare HMO | Admitting: Family Medicine

## 2021-05-17 ENCOUNTER — Encounter: Payer: Self-pay | Admitting: Family Medicine

## 2021-05-17 ENCOUNTER — Other Ambulatory Visit: Payer: Self-pay

## 2021-05-17 VITALS — BP 106/60 | HR 116 | Temp 97.4°F | Ht 62.5 in | Wt 123.5 lb

## 2021-05-17 DIAGNOSIS — E119 Type 2 diabetes mellitus without complications: Secondary | ICD-10-CM | POA: Diagnosis not present

## 2021-05-17 DIAGNOSIS — E785 Hyperlipidemia, unspecified: Secondary | ICD-10-CM | POA: Diagnosis not present

## 2021-05-17 DIAGNOSIS — E1169 Type 2 diabetes mellitus with other specified complication: Secondary | ICD-10-CM

## 2021-05-17 DIAGNOSIS — I2510 Atherosclerotic heart disease of native coronary artery without angina pectoris: Secondary | ICD-10-CM | POA: Diagnosis not present

## 2021-05-17 DIAGNOSIS — I700 Atherosclerosis of aorta: Secondary | ICD-10-CM | POA: Insufficient documentation

## 2021-05-17 DIAGNOSIS — J9601 Acute respiratory failure with hypoxia: Secondary | ICD-10-CM | POA: Diagnosis not present

## 2021-05-17 DIAGNOSIS — E1159 Type 2 diabetes mellitus with other circulatory complications: Secondary | ICD-10-CM

## 2021-05-17 DIAGNOSIS — I152 Hypertension secondary to endocrine disorders: Secondary | ICD-10-CM | POA: Diagnosis not present

## 2021-05-17 DIAGNOSIS — J849 Interstitial pulmonary disease, unspecified: Secondary | ICD-10-CM | POA: Insufficient documentation

## 2021-05-17 NOTE — Assessment & Plan Note (Signed)
Noted incidentally on CT scan of chest  No abd or chest pain  Not on statin due to intolerance  Cardiology ref done for this and CAD

## 2021-05-17 NOTE — Assessment & Plan Note (Signed)
Signs of fibrotic ILD on recent CT while hospitalized for respiratory failure Sat 95% on 2L of 02 Drops to 92% if off of it at rest for 5 minutes  Pulmonary visit planned 05/25/21 If symptoms change or worsen before that will let us know

## 2021-05-17 NOTE — Assessment & Plan Note (Signed)
3 vessel coronary atherosclerosis noted incidentally on CT  No chest discomfort noted  Diabetic but not on a statin due to h/o elevated lfts with atorvastatin  Is diabetic  Ref to cardiology

## 2021-05-17 NOTE — Assessment & Plan Note (Signed)
Recently hospitalized for this  Reviewed hospital records, lab results and studies in detail   CT indicating likely fibrotic ILD and ? Of fluid overload  Much improvement after tx with solumedrol and then prednisone as well as mild diuresis  For f/u with pulmonary

## 2021-05-17 NOTE — Assessment & Plan Note (Signed)
Lab Results  Component Value Date   HGBA1C 5.5 05/05/2021   Well controlled with metformin 500 mg daily

## 2021-05-17 NOTE — Patient Instructions (Addendum)
Continue your oxygen for now  Don't take it off for long periods of time   Follow up with the pulmonary doctor as planned   The office will call you about a referral to cardiology There was some evidence of coronary artery disease on your CT scan   If you develop chest pain or shortness of breath - let us know  Glad you are doing better

## 2021-05-17 NOTE — Assessment & Plan Note (Signed)
Taking lopid for trig  Past hepatic enzyme change on statin  Disc goals for lipids and reasons to control them Rev last labs with pt Rev low sat fat diet in detail Some CAD and aortic atherosclerosis on CT chest

## 2021-05-17 NOTE — Assessment & Plan Note (Addendum)
bp in fair control at this time  BP Readings from Last 1 Encounters:  05/17/21 106/60   No changes needed Most recent labs reviewed  Disc lifstyle change with low sodium diet and exercise  Plans to continue accupril 20 mg daily (instead of 40 due to bp at lower limit)  Also amlodipine 10 mg daily

## 2021-05-17 NOTE — Progress Notes (Signed)
Subjective:    Patient ID: Barbara [name], female    DOB: 09-25-1941, 80 y.o.   MRN: [id number]  This visit occurred during the SARS-CoV-2 public health emergency.  Safety protocols were in place, including screening questions prior to the visit, additional usage of staff PPE, and extensive cleaning of exam room while observing appropriate contact time as indicated for disinfecting solutions.   HPI Pt presents for f/u of hosp for CHF and hypoxic resp failure and interstitial lung dz  Hosp from 5/31 to 05/09/21 She presented on day of admit with sob with cough and new hypoxia (first went to [facility]) She was satting 86% on RA  A/P per discharge summary ?ILD -CT chest showed patchy groundglass opacities bilaterally, bronchiectasis, mild honeycombing.  Suggestive of cardiogenic pulmonary edema superimposed on chronic basilar predominant fibrotic interstitial lung disease, potentially underlying UIP -received Solu-Medrol--change to oral steroid taper -Repeat chest x-ray reviewed independently, showing diffuse interstitial opacities.  Radiology read as showing chronic ILD with probable superimposed pulmonary edema --d/w case with Dr Lanney Gins (covering for dr fleming)--pt eill f/u as out pt -- procalcitonin --negative and RVP negative  She was d/c with 50 mg prednisone taper Also accupril 40 mg daily  Viral panel and covid negative  Has appt with Dr Lanney Gins on 6/21  CT chest from 5/31:  IMPRESSION: 1. Motion degraded scan, limiting assessment. 2. Mild cardiomegaly. 3. Patchy ground-glass opacity throughout both lungs. Mild traction bronchiectasis, architectural distortion and mild honeycombing, basilar predominant. Findings are suggestive of cardiogenic pulmonary edema superimposed on chronic basilar predominant fibrotic interstitial lung disease, potentially underlying UIP. Suggest attention on follow-up high-resolution chest CT study in 3-6 months. 4. Three-vessel coronary  atherosclerosis. 5. Dilated main pulmonary artery, suggesting pulmonary arterial hypertension. 6. Mild mediastinal lymphadenopathy, nonspecific, probably reactive. 7. Small hiatal hernia. 8. Chronic appearing T4 and T10 vertebral compression fractures. 9. Aortic Atherosclerosis (ICD10-I70.0).  CXR on 6/3 IMPRESSION: 1. Similar appearing chronic interstitial lung disease with probable superimposed pulmonary edema.   Pulmonary edema--mild -BNP 413 -Echocardiogram showed EF 55 to 60%, normal LV function, no regional wall motion abnormality, LV diastolic parameters were normal, RV systolic function normal, RV size normal -Patient does not look overtly fluid overloaded on examination.  IV Lasix was stopped due to increase in creatinine  Lab Results  Component Value Date   WBC 9.0 05/05/2021   HGB 10.3 (L) 05/05/2021   HCT 29.8 (L) 05/05/2021   MCV 83.9 05/05/2021   PLT 444 (H) 05/05/2021  Hb low- needs re check  May be dilutional     AKI -Insetting of IV Lasix use.  Hold Lasix, ACE -creat 0.98 at d/c  Last renal panel Lab Results  Component Value Date   CREATININE 0.98 05/08/2021   BUN 54 (H) 05/08/2021   NA 138 05/08/2021   K 4.6 05/08/2021   CL 103 05/08/2021   CO2 25 05/08/2021      Diabetes mellitus type 2, overall well controlled hyperglycemia secondary to steroids -Hemoglobin A1c 5.5 -Continue sliding scale insulin -- continue metformin   Hypertension -Continue amlodipine  Currently taking accupril 20 instead of 40 due to low nl bp reading   Hypothyroidism -Continue Synthroid  Lab Results  Component Value Date   TSH 2.84 12/01/2020      Hyperlipidemia -Continue lopid Intol of statin (causes inc lft)   Depression/anxiety   Today:  Wt Readings from Last 3 Encounters:  05/17/21 123 lb 8 oz (56 kg)  05/09/21 124 lb 8 oz (56.5  kg)  12/03/20 130 lb 3 oz (59.1 kg)   22.23 kg/m  Bp BP Readings from Last 3 Encounters:  05/17/21 106/60   05/09/21 118/76  05/04/21 120/71   Pulse ox 95% on 02  Pt never smoked Father and son smoked  No exp to chemicals except tobacco (outdoors)   Does not get short of breath when she takes off the oxygen   Cholesterol  Lab Results  Component Value Date   CHOL 194 12/01/2020   HDL 44.00 12/01/2020   LDLCALC 123 (H) 12/01/2020   LDLDIRECT 123.0 04/01/2019   TRIG 134.0 12/01/2020   CHOLHDL 4 12/01/2020   Patient Active Problem List   Diagnosis Date Noted   CAD (coronary artery disease) 05/17/2021   Aortic atherosclerosis ([place]) 05/17/2021   ILD (interstitial lung disease) ([place]) 05/17/2021   Acute respiratory failure with hypoxia ([place]) 05/04/2021   Elevated troponin 05/04/2021   Hepatotoxicity due to statin drug 12/03/2020   Intertrigo 10/03/2018   Vitamin D deficiency 03/20/2018   Osteoporosis 09/25/2016   Encounter for screening mammogram for breast cancer 09/13/2016   Estrogen deficiency 09/13/2016   History of shingles 07/27/2016   Routine general medical examination at a health care facility 07/08/2015   Vaginal atrophy 07/01/2013   Encounter for Medicare annual wellness exam 07/01/2013   Colon cancer screening 06/26/2012   Hypothyroid 06/18/2012   Encounter for routine gynecological examination 02/15/2012   Controlled type 2 diabetes mellitus without complication, without long-term current use of insulin ([place]) 06/05/2007   Hyperlipidemia associated with type 2 diabetes mellitus ([place]) 06/05/2007   Generalized anxiety disorder 06/05/2007   EXPRESSIVE LANGUAGE DISORDER 06/05/2007   ISCHEMIA, RETINAL 06/05/2007   Hypertension associated with diabetes ([place]) 06/05/2007   ALLERGIC RHINITIS 06/05/2007   Past Medical History:  Diagnosis Date   Allergy    Anemia    Anxiety    Diabetes mellitus    Expressive language disorder    GERD (gastroesophageal reflux disease)    Hyperlipidemia    Hypertension    Learning disability    Mentally disabled    Obesity    Retinal  ischemia    history of   Past Surgical History:  Procedure Laterality Date   CATARACT EXTRACTION Left 2014   CHOLECYSTECTOMY     Social History   Tobacco Use   Smoking status: Never   Smokeless tobacco: Never  Vaping Use   Vaping Use: Never used  Substance Use Topics   Alcohol use: No    Alcohol/week: 0.0 standard drinks   Drug use: No   Family History  Problem Relation Age of Onset   Heart disease Father    Hypertension Father    Heart disease Brother    Diabetes Brother    Allergies  Allergen Reactions   Atorvastatin     REACTION: increased liver fxn tests   Ezetimibe     REACTION: stomach upset/malaise   Sulfonamide Derivatives     REACTION: rash   Current Outpatient Medications on File Prior to Visit  Medication Sig Dispense Refill   acetaminophen (TYLENOL) 500 MG tablet Take 500-1,000 mg by mouth every 6 (six) hours as needed for mild pain or moderate pain.     albuterol (VENTOLIN HFA) 108 (90 Base) MCG/ACT inhaler Inhale 2 puffs into the lungs every 6 (six) hours as needed for wheezing or shortness of breath. 8 g 2   alendronate (FOSAMAX) 70 MG tablet TAKE 1 TABLET BY MOUTH 30 MINUTES BEFOREBREAKFAST ONCE A WEEK WITH ATLEAST  8 OZ. OF WATER. (Patient taking differently: Take 70 mg by mouth every 7 (seven) days.) 12 tablet 3   amLODipine (NORVASC) 10 MG tablet Take 1 tablet (10 mg total) by mouth daily. 90 tablet 3   Calcium Carb-Cholecalciferol 500-400 MG-UNIT CHEW Take 1 tablet by mouth daily.     clotrimazole (LOTRIMIN) 1 % cream Apply 1 application topically 2 (two) times daily. To itchy genital areas (Patient taking differently: Apply 1 application topically 2 (two) times daily as needed (itchy genital area).) 30 g 1   ferrous sulfate 325 (65 FE) MG tablet Take 1 tablet (325 mg total) by mouth daily with breakfast.  3   fexofenadine (ALLEGRA) 180 MG tablet Take 180 mg by mouth daily as needed for allergies.     fluticasone (FLONASE) 50 MCG/ACT nasal spray USE  TWO SPRAYS IN EACH NOSTRIL DAILY (Patient taking differently: Place 2 sprays into both nostrils daily as needed for allergies.) 48 g 3   gemfibrozil (LOPID) 600 MG tablet Take 1 tablet (600 mg total) by mouth 2 (two) times daily. 180 tablet 3   levothyroxine (SYNTHROID) 100 MCG tablet TAKE 1 TABLET BY MOUTH DAILY BEFORE BREAKFAST. (Patient taking differently: Take 100 mcg by mouth daily before breakfast.) 90 tablet 3   metFORMIN (GLUCOPHAGE) 500 MG tablet TAKE 1 TABLET BY MOUTH EVERY DAY WITH BREAKFAST (Patient taking differently: Take 500 mg by mouth daily with breakfast.) 90 tablet 3   nystatin (MYCOSTATIN/NYSTOP) powder Apply topically 2 (two) times daily as needed. To itchy rash under breasts (Patient taking differently: Apply 1 application topically 2 (two) times daily as needed (rash under the breasts).) 30 g 1   omeprazole (PRILOSEC) 20 MG capsule TAKE ONE CAPSULE BY MOUTH DAILY BEFORE BREAKFAST (Patient taking differently: Take 20 mg by mouth daily before breakfast.) 90 capsule 3   predniSONE (DELTASONE) 10 MG tablet Take 5 tablets (50 mg total) by mouth daily with breakfast. 50 mg daily taper by 10 mg daily then stop 15 tablet 0   quinapril (ACCUPRIL) 40 MG tablet TAKE ONE-HALF (0.5) BY MOUTH DAILY (Patient taking differently: Take 20 mg by mouth daily.) 45 tablet 3   sertraline (ZOLOFT) 50 MG tablet Take 1 tablet (50 mg total) by mouth daily. 90 tablet 3   triamcinolone cream (KENALOG) 0.1 % Apply 1 application topically 2 (two) times daily. (Patient taking differently: Apply 1 application topically 2 (two) times daily as needed (skin irritation).) 30 g 0   vitamin B-12 (CYANOCOBALAMIN) 1000 MCG tablet Take 1 tablet (1,000 mcg total) by mouth daily.     No current facility-administered medications on file prior to visit.     Review of Systems  Constitutional:  Negative for activity change, appetite change, fatigue, fever and unexpected weight change.  HENT:  Negative for congestion,  rhinorrhea, sore throat and trouble swallowing.   Eyes:  Negative for pain, redness, itching and visual disturbance.  Respiratory:  Positive for shortness of breath. Negative for apnea, cough, chest tightness, wheezing and stridor.   Cardiovascular:  Negative for chest pain, palpitations and leg swelling.  Gastrointestinal:  Negative for abdominal pain, blood in stool, constipation, diarrhea and nausea.  Endocrine: Negative for cold intolerance, heat intolerance, polydipsia and polyuria.  Genitourinary:  Negative for difficulty urinating, dysuria, frequency and urgency.  Musculoskeletal:  Negative for arthralgias, joint swelling and myalgias.  Skin:  Negative for pallor and rash.  Neurological:  Negative for dizziness, tremors, weakness, numbness and headaches.  Hematological:  Negative for adenopathy. Does not  bruise/bleed easily.  Psychiatric/Behavioral:  Negative for decreased concentration and dysphoric mood. The patient is not nervous/anxious.       Objective:   Physical Exam Constitutional:      General: She is not in acute distress.    Appearance: Normal appearance. She is well-developed and normal weight. She is not ill-appearing or diaphoretic.  HENT:     Head: Normocephalic and atraumatic.  Eyes:     Conjunctiva/sclera: Conjunctivae normal.     Pupils: Pupils are equal, round, and reactive to light.     Comments: Baseline abn appearing pupil R eye  Neck:     Thyroid: No thyromegaly.     Vascular: No carotid bruit or JVD.  Cardiovascular:     Rate and Rhythm: Regular rhythm. Tachycardia present.     Pulses: Normal pulses.     Heart sounds: Normal heart sounds.    No gallop.  Pulmonary:     Effort: Pulmonary effort is normal. No respiratory distress.     Breath sounds: Normal breath sounds. No wheezing or rales.     Comments: Dry crackles heard at both bases  No wheeze or rhonchi Abdominal:     General: Bowel sounds are normal. There is no distension or abdominal bruit.      Palpations: Abdomen is soft. There is no mass.     Tenderness: There is no abdominal tenderness.  Musculoskeletal:     Cervical back: Normal range of motion and neck supple. No tenderness.     Right lower leg: No edema.     Left lower leg: No edema.  Lymphadenopathy:     Cervical: No cervical adenopathy.  Skin:    General: Skin is warm and dry.     Coloration: Skin is not pale.     Findings: No erythema or rash.     Comments: No cyanosis  Neurological:     Mental Status: She is alert.     Coordination: Coordination normal.     Deep Tendon Reflexes: Reflexes are normal and symmetric. Reflexes normal.  Psychiatric:        Mood and Affect: Mood normal.     Comments: Good mood Pleasant and comfortable           Assessment & Plan:   Problem List Items Addressed This Visit       Cardiovascular and Mediastinum   Hypertension associated with diabetes ([place])    bp in fair control at this time  BP Readings from Last 1 Encounters:  05/17/21 106/60  No changes needed Most recent labs reviewed  Disc lifstyle change with low sodium diet and exercise  Plans to continue accupril 20 mg daily (instead of 40 due to bp at lower limit)  Also amlodipine 10 mg daily          CAD (coronary artery disease)    3 vessel coronary atherosclerosis noted incidentally on CT  No chest discomfort noted  Diabetic but not on a statin due to h/o elevated lfts with atorvastatin  Is diabetic  Ref to cardiology        Relevant Orders   Ambulatory referral to Cardiology   Aortic atherosclerosis ([place])    Noted incidentally on CT scan of chest  No abd or chest pain  Not on statin due to intolerance  Cardiology ref done for this and CAD         Respiratory   Acute respiratory failure with hypoxia ([place]) - Primary    Recently hospitalized for this  Reviewed hospital records, lab results and studies in detail   CT indicating likely fibrotic ILD and ? Of fluid overload  Much improvement  after tx with solumedrol and then prednisone as well as mild diuresis  For f/u with pulmonary        ILD (interstitial lung disease) ([facility])    Signs of fibrotic ILD on recent CT while hospitalized for respiratory failure Sat 95% on 2L of 02 Drops to 92% if off of it at rest for 5 minutes  Pulmonary visit planned 05/25/21 If symptoms change or worsen before that will let us know           Endocrine   Controlled type 2 diabetes mellitus without complication, without long-term current use of insulin ([place])    Lab Results  Component Value Date   HGBA1C 5.5 05/05/2021  Well controlled with metformin 500 mg daily       Hyperlipidemia associated with type 2 diabetes mellitus ([place])    Taking lopid for trig  Past hepatic enzyme change on statin  Disc goals for lipids and reasons to control them Rev last labs with pt Rev low sat fat diet in detail Some CAD and aortic atherosclerosis on CT chest

## 2021-05-28 ENCOUNTER — Emergency Department: Payer: Medicare HMO

## 2021-05-28 ENCOUNTER — Inpatient Hospital Stay: Payer: Medicare HMO

## 2021-05-28 ENCOUNTER — Other Ambulatory Visit: Payer: Self-pay

## 2021-05-28 ENCOUNTER — Inpatient Hospital Stay (HOSPITAL_COMMUNITY)
Admit: 2021-05-28 | Discharge: 2021-05-28 | Disposition: A | Discharge status: Home / Self Care | Payer: Medicare HMO | Attending: Physician Assistant | Admitting: Physician Assistant

## 2021-05-28 ENCOUNTER — Inpatient Hospital Stay
Admission: EM | Admit: 2021-05-28 | Discharge: 2021-06-04 | DRG: 853 | Disposition: E | Payer: Medicare HMO | Attending: Internal Medicine | Admitting: Internal Medicine

## 2021-05-28 DIAGNOSIS — I471 Supraventricular tachycardia: Secondary | ICD-10-CM | POA: Diagnosis not present

## 2021-05-28 DIAGNOSIS — Z515 Encounter for palliative care: Secondary | ICD-10-CM

## 2021-05-28 DIAGNOSIS — R579 Shock, unspecified: Secondary | ICD-10-CM

## 2021-05-28 DIAGNOSIS — A419 Sepsis, unspecified organism: Principal | ICD-10-CM | POA: Diagnosis present

## 2021-05-28 DIAGNOSIS — J849 Interstitial pulmonary disease, unspecified: Secondary | ICD-10-CM

## 2021-05-28 DIAGNOSIS — R54 Age-related physical debility: Secondary | ICD-10-CM | POA: Diagnosis present

## 2021-05-28 DIAGNOSIS — I214 Non-ST elevation (NSTEMI) myocardial infarction: Secondary | ICD-10-CM

## 2021-05-28 DIAGNOSIS — I259 Chronic ischemic heart disease, unspecified: Secondary | ICD-10-CM | POA: Diagnosis present

## 2021-05-28 DIAGNOSIS — I501 Left ventricular failure: Secondary | ICD-10-CM

## 2021-05-28 DIAGNOSIS — Z66 Do not resuscitate: Secondary | ICD-10-CM | POA: Diagnosis not present

## 2021-05-28 DIAGNOSIS — E871 Hypo-osmolality and hyponatremia: Secondary | ICD-10-CM | POA: Diagnosis present

## 2021-05-28 DIAGNOSIS — I5021 Acute systolic (congestive) heart failure: Secondary | ICD-10-CM

## 2021-05-28 DIAGNOSIS — J9601 Acute respiratory failure with hypoxia: Secondary | ICD-10-CM | POA: Diagnosis present

## 2021-05-28 DIAGNOSIS — E119 Type 2 diabetes mellitus without complications: Secondary | ICD-10-CM | POA: Diagnosis present

## 2021-05-28 DIAGNOSIS — Z888 Allergy status to other drugs, medicaments and biological substances status: Secondary | ICD-10-CM

## 2021-05-28 DIAGNOSIS — R918 Other nonspecific abnormal finding of lung field: Secondary | ICD-10-CM | POA: Diagnosis not present

## 2021-05-28 DIAGNOSIS — Z20822 Contact with and (suspected) exposure to covid-19: Secondary | ICD-10-CM | POA: Diagnosis present

## 2021-05-28 DIAGNOSIS — I249 Acute ischemic heart disease, unspecified: Secondary | ICD-10-CM | POA: Diagnosis not present

## 2021-05-28 DIAGNOSIS — R0489 Hemorrhage from other sites in respiratory passages: Secondary | ICD-10-CM | POA: Diagnosis present

## 2021-05-28 DIAGNOSIS — R0902 Hypoxemia: Secondary | ICD-10-CM | POA: Diagnosis not present

## 2021-05-28 DIAGNOSIS — R778 Other specified abnormalities of plasma proteins: Secondary | ICD-10-CM

## 2021-05-28 DIAGNOSIS — R570 Cardiogenic shock: Secondary | ICD-10-CM | POA: Diagnosis not present

## 2021-05-28 DIAGNOSIS — F419 Anxiety disorder, unspecified: Secondary | ICD-10-CM | POA: Diagnosis present

## 2021-05-28 DIAGNOSIS — M810 Age-related osteoporosis without current pathological fracture: Secondary | ICD-10-CM | POA: Diagnosis present

## 2021-05-28 DIAGNOSIS — J811 Chronic pulmonary edema: Secondary | ICD-10-CM | POA: Diagnosis not present

## 2021-05-28 DIAGNOSIS — J95811 Postprocedural pneumothorax: Secondary | ICD-10-CM

## 2021-05-28 DIAGNOSIS — Z4659 Encounter for fitting and adjustment of other gastrointestinal appliance and device: Secondary | ICD-10-CM | POA: Diagnosis not present

## 2021-05-28 DIAGNOSIS — I959 Hypotension, unspecified: Secondary | ICD-10-CM | POA: Diagnosis not present

## 2021-05-28 DIAGNOSIS — Z9049 Acquired absence of other specified parts of digestive tract: Secondary | ICD-10-CM

## 2021-05-28 DIAGNOSIS — I255 Ischemic cardiomyopathy: Secondary | ICD-10-CM | POA: Diagnosis present

## 2021-05-28 DIAGNOSIS — J810 Acute pulmonary edema: Secondary | ICD-10-CM | POA: Diagnosis not present

## 2021-05-28 DIAGNOSIS — I110 Hypertensive heart disease with heart failure: Secondary | ICD-10-CM | POA: Diagnosis present

## 2021-05-28 DIAGNOSIS — R0602 Shortness of breath: Secondary | ICD-10-CM | POA: Diagnosis not present

## 2021-05-28 DIAGNOSIS — R059 Cough, unspecified: Secondary | ICD-10-CM | POA: Diagnosis not present

## 2021-05-28 DIAGNOSIS — Z4682 Encounter for fitting and adjustment of non-vascular catheter: Secondary | ICD-10-CM | POA: Diagnosis not present

## 2021-05-28 DIAGNOSIS — J9621 Acute and chronic respiratory failure with hypoxia: Secondary | ICD-10-CM | POA: Diagnosis present

## 2021-05-28 DIAGNOSIS — J9690 Respiratory failure, unspecified, unspecified whether with hypoxia or hypercapnia: Secondary | ICD-10-CM | POA: Diagnosis not present

## 2021-05-28 DIAGNOSIS — D649 Anemia, unspecified: Secondary | ICD-10-CM | POA: Diagnosis present

## 2021-05-28 DIAGNOSIS — F801 Expressive language disorder: Secondary | ICD-10-CM | POA: Diagnosis present

## 2021-05-28 DIAGNOSIS — Z452 Encounter for adjustment and management of vascular access device: Secondary | ICD-10-CM

## 2021-05-28 DIAGNOSIS — R6521 Severe sepsis with septic shock: Secondary | ICD-10-CM | POA: Diagnosis present

## 2021-05-28 DIAGNOSIS — N179 Acute kidney failure, unspecified: Secondary | ICD-10-CM | POA: Diagnosis present

## 2021-05-28 DIAGNOSIS — R042 Hemoptysis: Secondary | ICD-10-CM

## 2021-05-28 DIAGNOSIS — F79 Unspecified intellectual disabilities: Secondary | ICD-10-CM | POA: Diagnosis present

## 2021-05-28 DIAGNOSIS — I5023 Acute on chronic systolic (congestive) heart failure: Secondary | ICD-10-CM | POA: Diagnosis not present

## 2021-05-28 DIAGNOSIS — I517 Cardiomegaly: Secondary | ICD-10-CM | POA: Diagnosis not present

## 2021-05-28 DIAGNOSIS — Z7983 Long term (current) use of bisphosphonates: Secondary | ICD-10-CM

## 2021-05-28 DIAGNOSIS — Z8249 Family history of ischemic heart disease and other diseases of the circulatory system: Secondary | ICD-10-CM

## 2021-05-28 DIAGNOSIS — R079 Chest pain, unspecified: Secondary | ICD-10-CM | POA: Diagnosis not present

## 2021-05-28 DIAGNOSIS — J189 Pneumonia, unspecified organism: Secondary | ICD-10-CM | POA: Diagnosis not present

## 2021-05-28 DIAGNOSIS — Z79890 Hormone replacement therapy (postmenopausal): Secondary | ICD-10-CM

## 2021-05-28 DIAGNOSIS — G928 Other toxic encephalopathy: Secondary | ICD-10-CM | POA: Diagnosis not present

## 2021-05-28 DIAGNOSIS — J9622 Acute and chronic respiratory failure with hypercapnia: Secondary | ICD-10-CM | POA: Diagnosis present

## 2021-05-28 DIAGNOSIS — E039 Hypothyroidism, unspecified: Secondary | ICD-10-CM | POA: Diagnosis present

## 2021-05-28 DIAGNOSIS — H3582 Retinal ischemia: Secondary | ICD-10-CM | POA: Diagnosis present

## 2021-05-28 DIAGNOSIS — Z79899 Other long term (current) drug therapy: Secondary | ICD-10-CM

## 2021-05-28 DIAGNOSIS — Z882 Allergy status to sulfonamides status: Secondary | ICD-10-CM

## 2021-05-28 DIAGNOSIS — Z833 Family history of diabetes mellitus: Secondary | ICD-10-CM

## 2021-05-28 DIAGNOSIS — F819 Developmental disorder of scholastic skills, unspecified: Secondary | ICD-10-CM | POA: Diagnosis present

## 2021-05-28 DIAGNOSIS — M47814 Spondylosis without myelopathy or radiculopathy, thoracic region: Secondary | ICD-10-CM | POA: Diagnosis not present

## 2021-05-28 DIAGNOSIS — Z7984 Long term (current) use of oral hypoglycemic drugs: Secondary | ICD-10-CM

## 2021-05-28 DIAGNOSIS — E785 Hyperlipidemia, unspecified: Secondary | ICD-10-CM | POA: Diagnosis present

## 2021-05-28 DIAGNOSIS — Z95810 Presence of automatic (implantable) cardiac defibrillator: Secondary | ICD-10-CM | POA: Diagnosis not present

## 2021-05-28 LAB — BLOOD GAS, ARTERIAL
Acid-Base Excess: 2 mmol/L (ref 0.0–2.0)
Acid-base deficit: 8.2 mmol/L — ABNORMAL HIGH (ref 0.0–2.0)
Allens test (pass/fail): POSITIVE — AB
Bicarbonate: 21.6 mmol/L (ref 20.0–28.0)
Bicarbonate: 30.3 mmol/L — ABNORMAL HIGH (ref 20.0–28.0)
FIO2: 1
FIO2: 1
MECHVT: 320 mL
MECHVT: 320 mL
O2 Saturation: 92.8 %
O2 Saturation: 98.2 %
PEEP: 8 cmH2O
PEEP: 8 cmH2O
Patient temperature: 37
Patient temperature: 37
RATE: 26 resp/min
RATE: 26 resp/min
pCO2 arterial: 68 mmHg (ref 32.0–48.0)
pCO2 arterial: 69 mmHg (ref 32.0–48.0)
pH, Arterial: 7.11 — CL (ref 7.350–7.450)
pH, Arterial: 7.25 — ABNORMAL LOW (ref 7.350–7.450)
pO2, Arterial: 88 mmHg (ref 83.0–108.0)
pO2, Arterial: 123 mmHg — ABNORMAL HIGH (ref 83.0–108.0)

## 2021-05-28 LAB — URINALYSIS, ROUTINE W REFLEX MICROSCOPIC
Bilirubin Urine: NEGATIVE
Glucose, UA: NEGATIVE mg/dL
Ketones, ur: NEGATIVE mg/dL
Nitrite: NEGATIVE
Protein, ur: 30 mg/dL — AB
Specific Gravity, Urine: 1.009 (ref 1.005–1.030)
WBC, UA: >50 WBC/hpf — ABNORMAL HIGH (ref 0–5)
pH: 5 (ref 5.0–8.0)

## 2021-05-28 LAB — CBC WITH DIFFERENTIAL/PLATELET
Abs Immature Granulocytes: 0.12 10*3/uL — ABNORMAL HIGH (ref 0.00–0.07)
Basophils Absolute: 0 10*3/uL (ref 0.0–0.1)
Basophils Relative: 0 %
Eosinophils Absolute: 0 10*3/uL (ref 0.0–0.5)
Eosinophils Relative: 0 %
HCT: 28.2 % — ABNORMAL LOW (ref 36.0–46.0)
Hemoglobin: 9.6 g/dL — ABNORMAL LOW (ref 12.0–15.0)
Immature Granulocytes: 1 %
Lymphocytes Relative: 6 %
Lymphs Abs: 0.8 10*3/uL (ref 0.7–4.0)
MCH: 28.7 pg (ref 26.0–34.0)
MCHC: 34 g/dL (ref 30.0–36.0)
MCV: 84.2 fL (ref 80.0–100.0)
Monocytes Absolute: 1.1 10*3/uL — ABNORMAL HIGH (ref 0.1–1.0)
Monocytes Relative: 7 %
Neutro Abs: 12.9 10*3/uL — ABNORMAL HIGH (ref 1.7–7.7)
Neutrophils Relative %: 86 %
Platelets: 301 10*3/uL (ref 150–400)
RBC: 3.35 MIL/uL — ABNORMAL LOW (ref 3.87–5.11)
RDW: 14.1 % (ref 11.5–15.5)
WBC: 15 10*3/uL — ABNORMAL HIGH (ref 4.0–10.5)
nRBC: 0 % (ref 0.0–0.2)

## 2021-05-28 LAB — TROPONIN I (HIGH SENSITIVITY)
Troponin I (High Sensitivity): 16387 ng/L (ref <18)
Troponin I (High Sensitivity): 14468 ng/L (ref <18)
Troponin I (High Sensitivity): 8867 ng/L (ref <18)
Troponin I (High Sensitivity): 13350 ng/L (ref <18)

## 2021-05-28 LAB — ECHOCARDIOGRAM LIMITED
Height: 61 in
S' Lateral: 3.8 cm
Weight: 1632 [oz_av]

## 2021-05-28 LAB — RESP PANEL BY RT-PCR (FLU A&B, COVID) ARPGX2
Influenza A by PCR: NEGATIVE
Influenza B by PCR: NEGATIVE
SARS Coronavirus 2 by RT PCR: NEGATIVE

## 2021-05-28 LAB — COMPREHENSIVE METABOLIC PANEL
ALT: 25 U/L (ref 0–44)
AST: 137 U/L — ABNORMAL HIGH (ref 15–41)
Albumin: 3.3 g/dL — ABNORMAL LOW (ref 3.5–5.0)
Alkaline Phosphatase: 63 U/L (ref 38–126)
Anion gap: 14 (ref 5–15)
BUN: 40 mg/dL — ABNORMAL HIGH (ref 8–23)
CO2: 21 mmol/L — ABNORMAL LOW (ref 22–32)
Calcium: 8.8 mg/dL — ABNORMAL LOW (ref 8.9–10.3)
Chloride: 95 mmol/L — ABNORMAL LOW (ref 98–111)
Creatinine, Ser: 1.82 mg/dL — ABNORMAL HIGH (ref 0.44–1.00)
GFR, Estimated: 28 mL/min — ABNORMAL LOW (ref >60)
Glucose, Bld: 207 mg/dL — ABNORMAL HIGH (ref 70–99)
Potassium: 3.6 mmol/L (ref 3.5–5.1)
Sodium: 130 mmol/L — ABNORMAL LOW (ref 135–145)
Total Bilirubin: 0.9 mg/dL (ref 0.3–1.2)
Total Protein: 7.2 g/dL (ref 6.5–8.1)

## 2021-05-28 LAB — GLUCOSE, CAPILLARY
Glucose-Capillary: 312 mg/dL — ABNORMAL HIGH (ref 70–99)
Glucose-Capillary: 314 mg/dL — ABNORMAL HIGH (ref 70–99)
Glucose-Capillary: 317 mg/dL — ABNORMAL HIGH (ref 70–99)

## 2021-05-28 LAB — PROCALCITONIN: Procalcitonin: 9.5 ng/mL

## 2021-05-28 LAB — BRAIN NATRIURETIC PEPTIDE: B Natriuretic Peptide: 1699.7 pg/mL — ABNORMAL HIGH (ref 0.0–100.0)

## 2021-05-28 LAB — MRSA NEXT GEN BY PCR, NASAL: MRSA by PCR Next Gen: NOT DETECTED

## 2021-05-28 LAB — HEMOGLOBIN AND HEMATOCRIT, BLOOD
HCT: 25.6 % — ABNORMAL LOW (ref 36.0–46.0)
Hemoglobin: 8.9 g/dL — ABNORMAL LOW (ref 12.0–15.0)

## 2021-05-28 LAB — PROTIME-INR
INR: 1.4 — ABNORMAL HIGH (ref 0.8–1.2)
Prothrombin Time: 17 seconds — ABNORMAL HIGH (ref 11.4–15.2)

## 2021-05-28 LAB — LACTIC ACID, PLASMA: Lactic Acid, Venous: 1.5 mmol/L (ref 0.5–1.9)

## 2021-05-28 LAB — APTT: aPTT: 38 seconds — ABNORMAL HIGH (ref 24–36)

## 2021-05-28 MED ORDER — ALBUTEROL SULFATE (2.5 MG/3ML) 0.083% IN NEBU
2.5000 mg | INHALATION_SOLUTION | RESPIRATORY_TRACT | Status: DC
  Administered 2021-05-28 – 2021-05-29 (×7): 2.5 mg via RESPIRATORY_TRACT
  Filled 2021-05-28 (×7): qty 3

## 2021-05-28 MED ORDER — DOCUSATE SODIUM 100 MG PO CAPS: 100.0000 mg | ORAL_CAPSULE | Freq: Two times a day (BID) | ORAL | Status: DC | PRN

## 2021-05-28 MED ORDER — SODIUM CHLORIDE 0.9 % IV SOLN
2.0000 g | INTRAVENOUS | Status: DC
  Filled 2021-05-28: qty 2

## 2021-05-28 MED ORDER — VASOPRESSIN 20 UNITS/100 ML INFUSION FOR SHOCK
0.0000 [IU]/min | INTRAVENOUS | Status: DC
Start: 2021-05-28 — End: 2021-06-01
  Administered 2021-05-28 – 2021-06-01 (×8): 0.03 [IU]/min via INTRAVENOUS
  Filled 2021-05-28 (×9): qty 100

## 2021-05-28 MED ORDER — MIDAZOLAM HCL 2 MG/2ML IJ SOLN
4.0000 mg | Freq: Once | INTRAMUSCULAR | Status: AC
  Administered 2021-05-28: 4 mg via INTRAVENOUS
  Filled 2021-05-28: qty 4

## 2021-05-28 MED ORDER — NOREPINEPHRINE 16 MG/250ML-% IV SOLN
0.0000 ug/min | INTRAVENOUS | Status: DC
Start: 2021-05-28 — End: 2021-06-01
  Administered 2021-05-28: 16 ug/min via INTRAVENOUS
  Administered 2021-05-29: 24.96 ug/min via INTRAVENOUS
  Administered 2021-05-30 (×2): 30 ug/min via INTRAVENOUS
  Administered 2021-05-30: 28 ug/min via INTRAVENOUS
  Administered 2021-05-31: 35 ug/min via INTRAVENOUS
  Administered 2021-05-31 – 2021-06-01 (×3): 40 ug/min via INTRAVENOUS
  Filled 2021-05-28 (×10): qty 250

## 2021-05-28 MED ORDER — PERFLUTREN LIPID MICROSPHERE
1.0000 mL | INTRAVENOUS | Status: AC | PRN
  Administered 2021-05-28: 2 mL via INTRAVENOUS
  Filled 2021-05-28: qty 10

## 2021-05-28 MED ORDER — ORAL CARE MOUTH RINSE
15.0000 mL | OROMUCOSAL | Status: DC
  Administered 2021-05-28 – 2021-06-01 (×34): 15 mL via OROMUCOSAL

## 2021-05-28 MED ORDER — CHLORHEXIDINE GLUCONATE 0.12% ORAL RINSE (MEDLINE KIT)
15.0000 mL | Freq: Two times a day (BID) | OROMUCOSAL | Status: DC
  Administered 2021-05-28 – 2021-06-01 (×8): 15 mL via OROMUCOSAL

## 2021-05-28 MED ORDER — NOREPINEPHRINE 4 MG/250ML-% IV SOLN
INTRAVENOUS | Status: AC
  Administered 2021-05-28: 4 mg
  Filled 2021-05-28: qty 250

## 2021-05-28 MED ORDER — FUROSEMIDE 10 MG/ML IJ SOLN
40.0000 mg | Freq: Once | INTRAMUSCULAR | Status: AC
  Administered 2021-05-28: 40 mg via INTRAVENOUS
  Filled 2021-05-28: qty 4

## 2021-05-28 MED ORDER — HEPARIN BOLUS VIA INFUSION
2500.0000 [IU] | Freq: Once | INTRAVENOUS | Status: AC
  Administered 2021-05-28: 2500 [IU] via INTRAVENOUS
  Filled 2021-05-28: qty 2500

## 2021-05-28 MED ORDER — SODIUM CHLORIDE 0.9% FLUSH
10.0000 mL | Freq: Two times a day (BID) | INTRAVENOUS | Status: DC
  Administered 2021-05-28 – 2021-05-30 (×4): 10 mL
  Administered 2021-05-30: 30 mL
  Administered 2021-05-31: 10 mL
  Administered 2021-05-31 – 2021-06-01 (×2): 30 mL

## 2021-05-28 MED ORDER — SODIUM CHLORIDE 0.9 % IV SOLN
2.0000 g | Freq: Once | INTRAVENOUS | Status: AC
  Administered 2021-05-28: 2 g via INTRAVENOUS
  Filled 2021-05-28: qty 2

## 2021-05-28 MED ORDER — ASPIRIN 81 MG PO CHEW: 324.0000 mg | CHEWABLE_TABLET | Freq: Once | ORAL | Status: DC

## 2021-05-28 MED ORDER — PHENYLEPHRINE HCL-NACL 10-0.9 MG/250ML-% IV SOLN
0.0000 ug/min | INTRAVENOUS | Status: DC
  Filled 2021-05-28: qty 250

## 2021-05-28 MED ORDER — METHYLPREDNISOLONE SODIUM SUCC 125 MG IJ SOLR
125.0000 mg | Freq: Once | INTRAMUSCULAR | Status: AC
  Administered 2021-05-28: 125 mg via INTRAVENOUS
  Filled 2021-05-28: qty 2

## 2021-05-28 MED ORDER — VECURONIUM BROMIDE 10 MG IV SOLR
10.0000 mg | Freq: Once | INTRAVENOUS | Status: AC
  Administered 2021-05-28: 10 mg via INTRAVENOUS
  Filled 2021-05-28: qty 10

## 2021-05-28 MED ORDER — POLYETHYLENE GLYCOL 3350 17 G PO PACK: 17.0000 g | PACK | Freq: Every day | ORAL | Status: DC | PRN

## 2021-05-28 MED ORDER — FENTANYL CITRATE (PF) 100 MCG/2ML IJ SOLN
75.0000 ug | Freq: Once | INTRAMUSCULAR | Status: AC
Start: 2021-05-28 — End: 2021-05-28
  Administered 2021-05-28: 75 ug via INTRAVENOUS
  Filled 2021-05-28: qty 2

## 2021-05-28 MED ORDER — NOREPINEPHRINE 4 MG/250ML-% IV SOLN
0.0000 ug/min | INTRAVENOUS | Status: DC
  Administered 2021-05-28: 5 ug/min via INTRAVENOUS
  Filled 2021-05-28: qty 250

## 2021-05-28 MED ORDER — PHENYLEPHRINE CONCENTRATED 100MG/250ML (0.4 MG/ML) INFUSION SIMPLE
0.0000 ug/min | INTRAVENOUS | Status: DC
  Administered 2021-05-28: 160 ug/min via INTRAVENOUS
  Administered 2021-05-29: 250 ug/min via INTRAVENOUS
  Administered 2021-05-29: 400 ug/min via INTRAVENOUS
  Administered 2021-05-29: 280 ug/min via INTRAVENOUS
  Administered 2021-05-29: 300 ug/min via INTRAVENOUS
  Administered 2021-05-30: 320 ug/min via INTRAVENOUS
  Administered 2021-05-30: 280 ug/min via INTRAVENOUS
  Administered 2021-05-30 – 2021-05-31 (×3): 320 ug/min via INTRAVENOUS
  Administered 2021-05-31 (×2): 400 ug/min via INTRAVENOUS
  Administered 2021-05-31: 370 ug/min via INTRAVENOUS
  Administered 2021-05-31: 400 ug/min via INTRAVENOUS
  Filled 2021-05-28 (×14): qty 250

## 2021-05-28 MED ORDER — SODIUM CHLORIDE 0.9 % IV BOLUS
1000.0000 mL | Freq: Once | INTRAVENOUS | Status: AC
  Administered 2021-05-28: 1000 mL via INTRAVENOUS

## 2021-05-28 MED ORDER — PROPOFOL 1000 MG/100ML IV EMUL
INTRAVENOUS | Status: AC
  Administered 2021-05-29: 5 ug/kg/min via INTRAVENOUS
  Filled 2021-05-28: qty 100

## 2021-05-28 MED ORDER — VANCOMYCIN VARIABLE DOSE PER UNSTABLE RENAL FUNCTION (PHARMACIST DOSING): Status: DC

## 2021-05-28 MED ORDER — METHYLPREDNISOLONE SODIUM SUCC 125 MG IJ SOLR
60.0000 mg | Freq: Four times a day (QID) | INTRAMUSCULAR | Status: DC
  Administered 2021-05-28 – 2021-05-31 (×13): 60 mg via INTRAVENOUS
  Filled 2021-05-28 (×13): qty 2

## 2021-05-28 MED ORDER — MIDAZOLAM HCL 2 MG/2ML IJ SOLN
4.0000 mg | INTRAMUSCULAR | Status: DC | PRN
  Administered 2021-05-29 – 2021-05-31 (×4): 4 mg via INTRAVENOUS
  Filled 2021-05-28 (×4): qty 4

## 2021-05-28 MED ORDER — VECURONIUM BROMIDE 10 MG IV SOLR
10.0000 mg | INTRAVENOUS | Status: DC | PRN
  Administered 2021-05-29 – 2021-05-30 (×3): 10 mg via INTRAVENOUS
  Filled 2021-05-28 (×3): qty 10

## 2021-05-28 MED ORDER — HEPARIN (PORCINE) 25000 UT/250ML-% IV SOLN
550.0000 [IU]/h | INTRAVENOUS | Status: DC
  Administered 2021-05-28: 550 [IU]/h via INTRAVENOUS
  Filled 2021-05-28: qty 250

## 2021-05-28 MED ORDER — PROPOFOL 1000 MG/100ML IV EMUL
5.0000 ug/kg/min | INTRAVENOUS | Status: DC
  Administered 2021-05-28: 5 ug/kg/min via INTRAVENOUS
  Administered 2021-05-29 – 2021-05-30 (×2): 20 ug/kg/min via INTRAVENOUS
  Administered 2021-05-30: 25 ug/kg/min via INTRAVENOUS
  Administered 2021-05-31 – 2021-06-01 (×2): 20 ug/kg/min via INTRAVENOUS
  Filled 2021-05-28 (×6): qty 100

## 2021-05-28 MED ORDER — SODIUM CHLORIDE 0.9% FLUSH: 10.0000 mL | INTRAVENOUS | Status: DC | PRN

## 2021-05-28 MED ORDER — FENTANYL CITRATE (PF) 100 MCG/2ML IJ SOLN
75.0000 ug | INTRAMUSCULAR | Status: DC | PRN
  Administered 2021-05-29: 75 ug via INTRAVENOUS
  Filled 2021-05-28: qty 2

## 2021-05-28 MED ORDER — SODIUM CHLORIDE 0.9 % IV SOLN: 1.0000 g | Freq: Two times a day (BID) | INTRAVENOUS | Status: DC

## 2021-05-28 MED ORDER — SODIUM BICARBONATE 8.4 % IV SOLN
150.0000 meq | Freq: Once | INTRAVENOUS | Status: AC
  Administered 2021-05-28: 150 meq via INTRAVENOUS
  Filled 2021-05-28: qty 150

## 2021-05-28 MED ORDER — CHLORHEXIDINE GLUCONATE CLOTH 2 % EX PADS
6.0000 | MEDICATED_PAD | Freq: Every day | CUTANEOUS | Status: DC
  Administered 2021-05-28 – 2021-06-01 (×5): 6 via TOPICAL

## 2021-05-28 MED ORDER — ROCURONIUM BROMIDE 50 MG/5ML IV SOLN
100.0000 mg | Freq: Once | INTRAVENOUS | Status: AC
  Administered 2021-05-28: 100 mg via INTRAVENOUS
  Filled 2021-05-28: qty 10

## 2021-05-28 MED ORDER — INSULIN ASPART 100 UNIT/ML IJ SOLN
0.0000 [IU] | INTRAMUSCULAR | Status: DC
  Administered 2021-05-28: 7 [IU] via SUBCUTANEOUS
  Administered 2021-05-29 (×2): 3 [IU] via SUBCUTANEOUS
  Administered 2021-05-29 (×2): 2 [IU] via SUBCUTANEOUS
  Administered 2021-05-29: 5 [IU] via SUBCUTANEOUS
  Administered 2021-05-29 – 2021-05-30 (×4): 3 [IU] via SUBCUTANEOUS
  Administered 2021-05-30 (×2): 2 [IU] via SUBCUTANEOUS
  Administered 2021-05-30 – 2021-05-31 (×2): 1 [IU] via SUBCUTANEOUS
  Administered 2021-05-31 (×2): 2 [IU] via SUBCUTANEOUS
  Administered 2021-05-31: 1 [IU] via SUBCUTANEOUS
  Administered 2021-05-31 – 2021-06-01 (×3): 2 [IU] via SUBCUTANEOUS
  Administered 2021-06-01: 1 [IU] via SUBCUTANEOUS
  Filled 2021-05-28 (×21): qty 1

## 2021-05-28 MED ORDER — ETOMIDATE 2 MG/ML IV SOLN
20.0000 mg | Freq: Once | INTRAVENOUS | Status: AC
  Administered 2021-05-28: 20 mg via INTRAVENOUS

## 2021-05-28 MED ORDER — VANCOMYCIN HCL IN DEXTROSE 1-5 GM/200ML-% IV SOLN
1000.0000 mg | Freq: Once | INTRAVENOUS | Status: AC
Start: 2021-05-28 — End: 2021-05-28
  Administered 2021-05-28: 1000 mg via INTRAVENOUS
  Filled 2021-05-28: qty 200

## 2021-05-28 MED ORDER — PANTOPRAZOLE SODIUM 40 MG IV SOLR
40.0000 mg | Freq: Every day | INTRAVENOUS | Status: DC
  Administered 2021-05-28 – 2021-05-31 (×4): 40 mg via INTRAVENOUS
  Filled 2021-05-28 (×4): qty 40

## 2021-05-28 NOTE — Consult Note (Signed)
ANTICOAGULATION CONSULT NOTE - Initial Consult  Pharmacy Consult for Heparin Infusion Indication: chest pain/ACS  Allergies  Allergen Reactions   Atorvastatin     REACTION: increased liver fxn tests   Ezetimibe     REACTION: stomach upset/malaise   Sulfonamide Derivatives     REACTION: rash    Patient Measurements: Height: 5\' 1"  (154.9 cm) Weight: 46.3 kg (102 lb) IBW/kg (Calculated) : 47.8 Heparin Dosing Weight: 46.3 kg  Vital Signs: Temp: 97.4 F (36.3 C) (06/24 1152) Temp Source: Oral (06/24 1152) BP: 79/48 (06/24 1430) Pulse Rate: 109 (06/24 1430)  Labs: Recent Labs    05/10/2021 1211 05/21/2021 1212 05/25/2021 1350  HGB  --  9.6*  --   HCT  --  28.2*  --   PLT  --  301  --   APTT 38*  --   --   LABPROT 17.0*  --   --   INR 1.4*  --   --   CREATININE  --  1.82*  --   TROPONINIHS  --  75,916* 14,468*    Estimated Creatinine Clearance: 18.3 mL/min (A) (by C-G formula based on SCr of 1.82 mg/dL (H)).   Medical History: Past Medical History:  Diagnosis Date   Allergy    Anemia    Anxiety    Diabetes mellitus    Expressive language disorder    GERD (gastroesophageal reflux disease)    Hyperlipidemia    Hypertension    Learning disability    Mentally disabled    Obesity    Retinal ischemia    history of    Medications:  Scheduled:   aspirin  324 mg Oral Once   Infusions:   heparin 550 Units/hr (05/22/2021 1424)   norepinephrine (LEVOPHED) Adult infusion 5 mcg/min (05/16/2021 1421)   vancomycin     PRN:   Assessment: Pharmacy has been consulted to initiate Heparin infusion in 80yo patient presenting to the ED with shortness of breath. Patient no prior history of anticoagulant use PTA. Baseline labs: aPTT 38 sec, INR 1.4, Plts 301, Hgb 9.6  Goal of Therapy:  Heparin level 0.3-0.7 units/ml Monitor platelets by anticoagulation protocol: Yes   Plan:  Give 2500 units bolus x 1 Start heparin infusion at 550 units/hr Check anti-Xa level in 8 hours and  daily while on heparin Continue to monitor H&H and platelets  Charmelle Soh A Jinnifer Montejano 05/30/2021,2:55 PM

## 2021-05-28 NOTE — Progress Notes (Signed)
*  PRELIMINARY RESULTS* Echocardiogram 2D Echocardiogram has been performed.  Barbara [name] 05/12/2021, 2:33 PM

## 2021-05-28 NOTE — Consult Note (Signed)
Cardiology Consultation:   Patient ID: Barbara [name]; [id number]; [birth year]   Admit date: 05/22/2021 Date of Consult: 05/08/2021  Primary Care Provider: Abner Greenspan, MD Primary Cardiologist: New to [facility] - consult by End Primary Electrophysiologist:  None   Patient Profile:   Barbara [name] is a 80 y.o. female with a hx of ILD, DM2, HTN, HLD, hypothyroidism, anxiety, and cognitive impairment who is being seen today for the evaluation of NSTEMI complicated by cardiogenic shock with acute HFrEF at the request of Dr. Jari Pigg.  History of Present Illness:   [name] has no previously known cardiac history.  She was admitted to the hospital in late 04/2021 with acute hypoxic respiratory failure requiring supplemental oxygen via nasal cannula suspected to be related to possible ILD with CT showing patchy groundglass opacities bilaterally, bronchiectasis, and mild honeycombing with findings suggestive of possible cardiogenic pulmonary edema superimposed on chronic fibrotic ILD.  Echo during that admission showed an EF of 55 to 60%, no regional wall motion abnormalities, normal LV diastolic function parameters, normal RV systolic function and ventricular cavity size, and no significant valvular abnormalities.  High-sensitivity troponin during that admission was initially 112 with a delta of 118 subsequently trending to 130.  BNP 413.  She was briefly diuresed though discontinued secondary to AKI.  She returned to [facility] on 6/24 with complaints of continued substernal chest pressure, dyspnea, orthopnea, and cough productive of yellow sputum that were largely unchanged dating back to her admission in 04/2021.  Upon her presentation she was noted to be hypotensive with BP in the 69C systolic.  Initial high-sensitivity troponin markedly elevated at greater than 16,000 with a delta troponin downtrending to approximately 14,000.  EKG showed sinus tachycardia with new Q wave in lead V3 when  compared to prior tracing.  Stat limited echo showed a new cardiomyopathy on preliminary read with EF approximately 30 to 35% with anterior wall motion abnormalities concerning for LAD territory infarct.  Due to persistent hypoxia she had been placed on BiPAP with significant accessory muscle use and persistent hypoxia with O2 saturations in the 80s.  Initial chest x-ray showed worsening diffuse interstitial and hazy airspace opacities in the bilateral lungs concerning for pulmonary edema or multifocal pneumonia superimposed on a chronic ILD.  Remaining labs were notable for BNP 1699, PCT 9.5, WBC 15, Hgb 9.6, INR 1.4, sodium 130, potassium 3.6, glucose 207, BUN 40, serum creatinine 1.82 with baseline approximately 0.8, albumin 3.3, AST 137, and influenza/COVID negative.  She has been placed on a Levophed and heparin drip.  Since cardiology consult she has been intubated for airway protection and in the context of persistent hypoxia on BiPAP in the 80s.  She has received IV Lasix 40 mg in the ED.  Currently, there are no ICU beds available at [facility].   Past Medical History:  Diagnosis Date   Allergy    Anemia    Anxiety    Diabetes mellitus    Expressive language disorder    GERD (gastroesophageal reflux disease)    Hyperlipidemia    Hypertension    Learning disability    Mentally disabled    Obesity    Retinal ischemia    history of    Past Surgical History:  Procedure Laterality Date   CATARACT EXTRACTION Left 2014   CHOLECYSTECTOMY       Home Meds: Prior to Admission medications   Medication Sig Start Date End Date Taking? Authorizing Provider  acetaminophen (TYLENOL) 500  MG tablet Take 500-1,000 mg by mouth 2 (two) times daily.   Yes [provider]  albuterol (VENTOLIN HFA) 108 (90 Base) MCG/ACT inhaler Inhale 2 puffs into the lungs every 6 (six) hours as needed for wheezing or shortness of breath. 05/09/21  Yes Fritzi Mandes, MD  alendronate (FOSAMAX) 70 MG tablet TAKE 1  TABLET BY MOUTH 30 MINUTES BEFOREBREAKFAST ONCE A WEEK WITH ATLEAST 8 OZ. OF WATER. Patient taking differently: Take 70 mg by mouth every 7 (seven) days. 12/03/20  Yes Tower, Wynelle Fanny, MD  amLODipine (NORVASC) 10 MG tablet Take 1 tablet (10 mg total) by mouth daily. 12/03/20  Yes Tower, Wynelle Fanny, MD  Calcium Carb-Cholecalciferol 500-400 MG-UNIT CHEW Take 1 tablet by mouth daily.   Yes [provider]  ferrous sulfate 325 (65 FE) MG tablet Take 1 tablet (325 mg total) by mouth daily with breakfast. 02/07/13  Yes Gatha Mayer, MD  fexofenadine (ALLEGRA) 180 MG tablet Take 180 mg by mouth daily as needed for allergies.   Yes [provider]  fluticasone (FLONASE) 50 MCG/ACT nasal spray USE TWO SPRAYS IN EACH NOSTRIL DAILY Patient taking differently: Place 2 sprays into both nostrils daily as needed for allergies. 10/10/19  Yes Tower, Wynelle Fanny, MD  gemfibrozil (LOPID) 600 MG tablet Take 1 tablet (600 mg total) by mouth 2 (two) times daily. 12/03/20  Yes Tower, Wynelle Fanny, MD  levothyroxine (SYNTHROID) 100 MCG tablet TAKE 1 TABLET BY MOUTH DAILY BEFORE BREAKFAST. Patient taking differently: Take 100 mcg by mouth daily before breakfast. 12/03/20  Yes Tower, Wynelle Fanny, MD  metFORMIN (GLUCOPHAGE) 500 MG tablet TAKE 1 TABLET BY MOUTH EVERY DAY WITH BREAKFAST Patient taking differently: Take 500 mg by mouth daily with breakfast. 12/03/20  Yes Tower, Wynelle Fanny, MD  nystatin (MYCOSTATIN/NYSTOP) powder Apply topically 2 (two) times daily as needed. To itchy rash under breasts Patient taking differently: Apply 1 application topically 2 (two) times daily as needed (rash under the breasts). 12/03/20  Yes Tower, Wynelle Fanny, MD  omeprazole (PRILOSEC) 20 MG capsule TAKE ONE CAPSULE BY MOUTH DAILY BEFORE BREAKFAST Patient taking differently: Take 20 mg by mouth daily before breakfast. 12/03/20  Yes Tower, Wynelle Fanny, MD  quinapril (ACCUPRIL) 40 MG tablet TAKE ONE-HALF (0.5) BY MOUTH DAILY Patient taking differently:  Take 20 mg by mouth daily. 12/03/20  Yes Tower, Wynelle Fanny, MD  sertraline (ZOLOFT) 50 MG tablet Take 1 tablet (50 mg total) by mouth daily. 12/03/20  Yes Tower, Wynelle Fanny, MD  triamcinolone cream (KENALOG) 0.1 % Apply 1 application topically 2 (two) times daily. Patient taking differently: Apply 1 application topically 2 (two) times daily as needed (skin irritation). 10/07/20  Yes Baity, Coralie Keens, NP  vitamin B-12 (CYANOCOBALAMIN) 1000 MCG tablet Take 1 tablet (1,000 mcg total) by mouth daily. 02/07/13  Yes Gatha Mayer, MD  clotrimazole (LOTRIMIN) 1 % cream Apply 1 application topically 2 (two) times daily. To itchy genital areas Patient taking differently: Apply 1 application topically 2 (two) times daily as needed (itchy genital area). 12/03/20   Tower, Wynelle Fanny, MD  predniSONE (DELTASONE) 10 MG tablet Take 5 tablets (50 mg total) by mouth daily with breakfast. 50 mg daily taper by 10 mg daily then stop Patient not taking: Reported on 05/18/2021 05/10/21   Fritzi Mandes, MD    Inpatient Medications: Scheduled Meds:  aspirin  324 mg Oral Once   Continuous Infusions:  norepinephrine (LEVOPHED) Adult infusion 10 mcg/min (05/11/2021 1505)   propofol (DIPRIVAN)  infusion 5 mcg/kg/min (05/17/2021 1516)   PRN Meds:   Allergies:   Allergies  Allergen Reactions   Atorvastatin     REACTION: increased liver fxn tests   Ezetimibe     REACTION: stomach upset/malaise   Sulfonamide Derivatives     REACTION: rash    Social History:   Social History   Socioeconomic History   Marital status: Divorced    Spouse name: Not on file   Number of children: 0   Years of education: 9th grade   Highest education level: Not on file  Occupational History   Occupation: disabled    Fish farm manager: Not Employed   Occupation: Disabled    Employer: Not Employed  Tobacco Use   Smoking status: Never   Smokeless tobacco: Never  Vaping Use   Vaping Use: Never used  Substance and Sexual Activity   Alcohol use: No     Alcohol/week: 0.0 standard drinks   Drug use: No   Sexual activity: Never  Other Topics Concern   Not on file  Social History Narrative   Ex-Husband has pschizoaffective disorder, family close by/ lot of support, works in garden   Disabled - mental retardation   Sister Arty Baumgartner is guardian   Social Determinants of Radio broadcast assistant Strain: Not on file  Food Insecurity: Not on file  Transportation Needs: Not on file  Physical Activity: Not on file  Stress: Not on file  Social Connections: Not on file  Intimate Partner Violence: Not on file     Family History:   Family History  Problem Relation Age of Onset   Heart disease Father    Hypertension Father    Heart disease Brother    Diabetes Brother     ROS:  Review of Systems  Constitutional:  Positive for malaise/fatigue. Negative for chills, diaphoresis, fever and weight loss.  HENT:  Negative for congestion.   Eyes:  Negative for discharge and redness.  Respiratory:  Positive for cough, sputum production and shortness of breath. Negative for wheezing.        Yellow sputum  Cardiovascular:  Positive for chest pain and orthopnea. Negative for palpitations, claudication, leg swelling and PND.  Gastrointestinal:  Negative for abdominal pain, heartburn, nausea and vomiting.  Musculoskeletal:  Negative for falls and myalgias.  Skin:  Negative for rash.  Neurological:  Positive for weakness. Negative for dizziness, tingling, tremors, sensory change, speech change, focal weakness and loss of consciousness.  Endo/Heme/Allergies:  Does not bruise/bleed easily.  Psychiatric/Behavioral:  Negative for substance abuse. The patient is not nervous/anxious.   All other systems reviewed and are negative.    Physical Exam/Data:   Vitals:   05/22/2021 1600 05/30/2021 1615 05/27/2021 1630 05/07/2021 1645  BP: 104/72 100/64 (!) 83/68 (!) 87/66  Pulse: (!) 142 (!) 141 (!) 142 (!) 131  Resp: 20 (!) 24 20 (!) 23  Temp: 99.1 F (37.3  C) 99.2 F (37.3 C) 99.3 F (37.4 C) 99.4 F (37.4 C)  TempSrc:      SpO2: 94% 92% (!) 85% 93%  Weight:      Height:        Intake/Output Summary (Last 24 hours) at 05/27/2021 1716 Last data filed at 05/11/2021 1329 Gross per 24 hour  Intake 1000 ml  Output --  Net 1000 ml   Filed Weights   05/09/2021 1153  Weight: 46.3 kg   Body mass index is 19.27 kg/m.   Physical Exam: General: Ill-appearing, in mild to  moderate distress. Head: Normocephalic, atraumatic, sclera non-icteric, no xanthomas, nares without discharge.  Neck: Negative for carotid bruits. JVD difficult to assess secondary to respiratory support apparatus. Lungs: On BiPAP during cardiology consult with accessory muscle use noted with diminished labored breath sounds noted bilaterally. Heart: RRR with S1 S2. No murmurs, rubs, or gallops appreciated. Abdomen: Soft, non-tender, non-distended with normoactive bowel sounds. No hepatomegaly. No rebound/guarding. No obvious abdominal masses. Msk:  Strength and tone appear normal for age. Extremities: No clubbing or cyanosis. No edema. Distal pedal pulses are 2+ and equal bilaterally. Neuro: Alert and oriented X 3. No facial asymmetry. No focal deficit. Moves all extremities spontaneously. Psych:  Responds to questions appropriately with a normal affect.   EKG:  The EKG was personally reviewed and demonstrates: Sinus tachycardia, 109 bpm, baseline wandering, anterior Q wave in V3 which is new when compared to prior tracing, nonspecific ST-T changes Telemetry:  Telemetry was personally reviewed and demonstrates: SR  Weights: [employer]   05/21/2021 1153  Weight: 46.3 kg    Relevant CV Studies:  2D echo 05/05/2021:  1. Left ventricular ejection fraction, by estimation, is 55 to 60%. The  left ventricle has normal function. The left ventricle has no regional  wall motion abnormalities. Left ventricular diastolic parameters were  normal.   2. Right ventricular systolic  function is normal. The right ventricular  size is normal.   3. The mitral valve is normal in structure. No evidence of mitral valve  regurgitation.   4. The aortic valve is grossly normal. Aortic valve regurgitation is not  visualized.  __________  Limited echo 05/12/2021: Preliminary read with EF approximately 30 to 35% with anterior wall hypokinesis   Laboratory Data:  Chemistry Recent Labs  Lab 05/11/2021 1212  NA 130*  K 3.6  CL 95*  CO2 21*  GLUCOSE 207*  BUN 40*  CREATININE 1.82*  CALCIUM 8.8*  GFRNONAA 28*  ANIONGAP 14    Recent Labs  Lab 06/02/2021 1212  PROT 7.2  ALBUMIN 3.3*  AST 137*  ALT 25  ALKPHOS 63  BILITOT 0.9   Hematology Recent Labs  Lab 05/27/2021 1212  WBC 15.0*  RBC 3.35*  HGB 9.6*  HCT 28.2*  MCV 84.2  MCH 28.7  MCHC 34.0  RDW 14.1  PLT 301   Cardiac EnzymesNo results for input(s): TROPONINI in the last 168 hours. No results for input(s): TROPIPOC in the last 168 hours.  BNP Recent Labs  Lab 05/14/2021 1212  BNP 1,699.7*    DDimer No results for input(s): DDIMER in the last 168 hours.  Radiology/Studies:  [facility] Chest Port 1 View  Result Date: 05/29/2021 IMPRESSION: 1. LEFT IJ central line placed since the previous study, terminating in the area of the caval to atrial junction. 2. Otherwise no change in the appearance of the chest with diffuse interstitial and airspace opacities in this intubated patient. 3. Interval placement of a gastric tube, tip off the field of view. Electronically Signed   By: Zetta Bills M.D.   On: 05/13/2021 16:48   DG Chest Portable 1 View  Result Date: 05/19/2021 IMPRESSION: 1. Endotracheal tube in satisfactory position. 2. Increased extensive bilateral pneumonia or alveolar edema. Electronically Signed   By: Claudie Revering M.D.   On: 05/26/2021 16:03   DG Chest Portable 1 View  Result Date: 05/22/2021 IMPRESSION: Worsened diffuse interstitial and hazy airspace opacities in both lungs concerning for  pulmonary edema or multifocal pneumonia superimposed on chronic interstitial lung disease for.  Electronically Signed   By: Titus Dubin M.D.   On: 05/11/2021 12:48    Assessment and Plan:   1.  Acute on chronic hypoxic respiratory failure: -Likely multifactorial including cardiogenic shock with acute systolic CHF, NSTEMI, and in the context of underlying ILD -On BiPAP during cardiology consult, though has subsequently been intubated and sedated due to work of breathing, persistent hypoxia with O2 saturations in the 80s and for airway protection  -Mechanical ventilation per CCM  2.  Cardiogenic shock with acute systolic CHF secondary to ICM: -Likely in the context of acute systolic CHF with significant anterior wall motion abnormalities consistent with LAD territory infarct and NSTEMI -Levophed -IV Lasix -Not currently able to add GDMT secondary to decompensated CHF and cardiogenic shock -Central venous access has been obtained by critical care -No ICU beds available at [facility], would strongly consider transfer to [facility] if bed is available for further ICU and advanced heart failure management  3.  NSTEMI: -Initial high-sensitivity troponin 16,387 with delta troponin downtrending to 14,468 indicating her event happened possibly several days ago -Heparin drip -Formal read on echo pending -Will need R/LHC when respiratory status is stable and likely following IV diuresis  4.  Elevated PCT: -Chest x-ray with diffuse interstitial and airspace opacities -Blood cultures pending -Empiric IV vancomycin per CCM -COVID and influenza negative  5.  Anemia: -Prior hemoglobin with a baseline around 12 with a current value of 9.6 -Monitor  6.  AKI: -Possibly in the setting of low output CHF with cardiogenic shock -Trend on vasopressor support and following IV Lasix    --Patient is critically ill with high risk of cardiopulmonary death.   For questions or updates, please contact [place] Please consult [email or web address] for contact info under Cardiology/STEMI.   Signed, Christell Faith, PA-C [place] Pager: [phone] 05/12/2021, 5:16 PM

## 2021-05-28 NOTE — Progress Notes (Signed)
Repeat Arterial Blood Gas result @ 2100 revealed:  pO2 123; pCO2 69; pH 7.25;  HCO3 30.3, %O2 Sat 98.2. post 3 amps of sodium bicarb/4 mg of versed/75 mcg of fentanyl and Vecuronium. Previous Ventilator settings: 100 FiO2, 8 PEEP, TV 320, Rate 26 with peak pressure range 36-38.  PLAN -Will continue current lung protective vent settings and allow for permissive hypercapnia with target pH of 7.25 or greater for acid base balance. -Decrease FI02 to keep sats >94% -Recheck ABG in the am   Rufina Falco, DNP, CCRN, FNP-C, AGACNP-BC Acute Care Nurse Practitioner  [facility] Pager: [phone] [facility] at [facility]

## 2021-05-28 NOTE — ED Provider Notes (Signed)
[facility] Emergency Department Provider Note  ____________________________________________   Event Date/Time   First MD Initiated Contact with Patient 05/18/2021 1158     (approximate)  I have reviewed the triage vital signs and the nursing notes.   HISTORY  Chief Complaint Shortness of Breath    HPI Barbara [name] is a 80 y.o. female with interstitial lung disease, possible CHF, mental disability who has a legal guardian who came into for respiratory failure and hypotension.  Patient went into clinic today and was found to be hypotensive and not hypoxic.  Patient really denies any symptoms and according to the legal guardian she just came in for a checkup.  Unable to get full HPI due to patient's mental disability which is at baseline for her   On review of records patient had admission on 5/31 where she was on oxygen and went home on 2 to 3 L of oxygen as needed.  She had a CT scan concerning for possible interstitial lung disease.  They were concerned that there could be some pulmonary edema and patient BNP was slightly elevated at 413 however echo was normal and Lasix had to be held due to increasing creatinine.          Past Medical History:  Diagnosis Date   Allergy    Anemia    Anxiety    Diabetes mellitus    Expressive language disorder    GERD (gastroesophageal reflux disease)    Hyperlipidemia    Hypertension    Learning disability    Mentally disabled    Obesity    Retinal ischemia    history of    Patient Active Problem List   Diagnosis Date Noted   CAD (coronary artery disease) 05/17/2021   Aortic atherosclerosis ([place]) 05/17/2021   ILD (interstitial lung disease) ([place]) 05/17/2021   Acute respiratory failure with hypoxia ([place]) 05/04/2021   Elevated troponin 05/04/2021   Hepatotoxicity due to statin drug 12/03/2020   Intertrigo 10/03/2018   Vitamin D deficiency 03/20/2018   Osteoporosis 09/25/2016   Encounter for  screening mammogram for breast cancer 09/13/2016   Estrogen deficiency 09/13/2016   History of shingles 07/27/2016   Routine general medical examination at a health care facility 07/08/2015   Vaginal atrophy 07/01/2013   Encounter for Medicare annual wellness exam 07/01/2013   Colon cancer screening 06/26/2012   Hypothyroid 06/18/2012   Encounter for routine gynecological examination 02/15/2012   Controlled type 2 diabetes mellitus without complication, without long-term current use of insulin ([place]) 06/05/2007   Hyperlipidemia associated with type 2 diabetes mellitus ([place]) 06/05/2007   Generalized anxiety disorder 06/05/2007   EXPRESSIVE LANGUAGE DISORDER 06/05/2007   ISCHEMIA, RETINAL 06/05/2007   Hypertension associated with diabetes ([place]) 06/05/2007   ALLERGIC RHINITIS 06/05/2007    Past Surgical History:  Procedure Laterality Date   CATARACT EXTRACTION Left 2014   CHOLECYSTECTOMY      Prior to Admission medications   Medication Sig Start Date End Date Taking? Authorizing Provider  acetaminophen (TYLENOL) 500 MG tablet Take 500-1,000 mg by mouth every 6 (six) hours as needed for mild pain or moderate pain.    [provider]  albuterol (VENTOLIN HFA) 108 (90 Base) MCG/ACT inhaler Inhale 2 puffs into the lungs every 6 (six) hours as needed for wheezing or shortness of breath. 05/09/21   Fritzi Mandes, MD  alendronate (FOSAMAX) 70 MG tablet TAKE 1 TABLET BY MOUTH 30 MINUTES BEFOREBREAKFAST ONCE A WEEK WITH ATLEAST 8 OZ. OF  WATER. Patient taking differently: Take 70 mg by mouth every 7 (seven) days. 12/03/20   Tower, Wynelle Fanny, MD  amLODipine (NORVASC) 10 MG tablet Take 1 tablet (10 mg total) by mouth daily. 12/03/20   Tower, Wynelle Fanny, MD  Calcium Carb-Cholecalciferol 500-400 MG-UNIT CHEW Take 1 tablet by mouth daily.    [provider]  clotrimazole (LOTRIMIN) 1 % cream Apply 1 application topically 2 (two) times daily. To itchy genital areas Patient taking differently:  Apply 1 application topically 2 (two) times daily as needed (itchy genital area). 12/03/20   Tower, Wynelle Fanny, MD  ferrous sulfate 325 (65 FE) MG tablet Take 1 tablet (325 mg total) by mouth daily with breakfast. 02/07/13   Gatha Mayer, MD  fexofenadine (ALLEGRA) 180 MG tablet Take 180 mg by mouth daily as needed for allergies.    [provider]  fluticasone (FLONASE) 50 MCG/ACT nasal spray USE TWO SPRAYS IN EACH NOSTRIL DAILY Patient taking differently: Place 2 sprays into both nostrils daily as needed for allergies. 10/10/19   Tower, Wynelle Fanny, MD  gemfibrozil (LOPID) 600 MG tablet Take 1 tablet (600 mg total) by mouth 2 (two) times daily. 12/03/20   Tower, Wynelle Fanny, MD  levothyroxine (SYNTHROID) 100 MCG tablet TAKE 1 TABLET BY MOUTH DAILY BEFORE BREAKFAST. Patient taking differently: Take 100 mcg by mouth daily before breakfast. 12/03/20   Tower, Wynelle Fanny, MD  metFORMIN (GLUCOPHAGE) 500 MG tablet TAKE 1 TABLET BY MOUTH EVERY DAY WITH BREAKFAST Patient taking differently: Take 500 mg by mouth daily with breakfast. 12/03/20   Tower, Wynelle Fanny, MD  nystatin (MYCOSTATIN/NYSTOP) powder Apply topically 2 (two) times daily as needed. To itchy rash under breasts Patient taking differently: Apply 1 application topically 2 (two) times daily as needed (rash under the breasts). 12/03/20   Tower, Wynelle Fanny, MD  omeprazole (PRILOSEC) 20 MG capsule TAKE ONE CAPSULE BY MOUTH DAILY BEFORE BREAKFAST Patient taking differently: Take 20 mg by mouth daily before breakfast. 12/03/20   Tower, Wynelle Fanny, MD  predniSONE (DELTASONE) 10 MG tablet Take 5 tablets (50 mg total) by mouth daily with breakfast. 50 mg daily taper by 10 mg daily then stop 05/10/21   Fritzi Mandes, MD  quinapril (ACCUPRIL) 40 MG tablet TAKE ONE-HALF (0.5) BY MOUTH DAILY Patient taking differently: Take 20 mg by mouth daily. 12/03/20   Tower, Wynelle Fanny, MD  sertraline (ZOLOFT) 50 MG tablet Take 1 tablet (50 mg total) by mouth daily. 12/03/20   Tower, Wynelle Fanny, MD  triamcinolone cream (KENALOG) 0.1 % Apply 1 application topically 2 (two) times daily. Patient taking differently: Apply 1 application topically 2 (two) times daily as needed (skin irritation). 10/07/20   Jearld Fenton, NP  vitamin B-12 (CYANOCOBALAMIN) 1000 MCG tablet Take 1 tablet (1,000 mcg total) by mouth daily. 02/07/13   Gatha Mayer, MD    Allergies Atorvastatin, Ezetimibe, and Sulfonamide derivatives  Family History  Problem Relation Age of Onset   Heart disease Father    Hypertension Father    Heart disease Brother    Diabetes Brother     Social History Social History   Tobacco Use   Smoking status: Never   Smokeless tobacco: Never  Vaping Use   Vaping Use: Never used  Substance Use Topics   Alcohol use: No    Alcohol/week: 0.0 standard drinks   Drug use: No      Review of Systems Unable to get full review of systems due to patient's  baseline intellectual disability ____________________________________________   PHYSICAL EXAM:  VITAL SIGNS: ED Triage Vitals  Enc Vitals Group     BP 05/27/2021 1152 (!) 75/49     Pulse Rate 05/13/2021 1152 (!) 107     Resp 05/30/2021 1152 (!) 26     Temp 05/08/2021 1152 (!) 97.4 F (36.3 C)     Temp Source 05/22/2021 1152 Oral     SpO2 05/25/2021 1151 (!) 79 %     Weight 05/16/2021 1153 102 lb (46.3 kg)     Height 05/07/2021 1153 5\' 1"  (1.549 m)     Head Circumference --      Peak Flow --      Pain Score 05/05/2021 1153 0     Pain Loc --      Pain Edu? --      Excl. in [place]? --     Constitutional: Alert and oriented. Well appearing and in no acute distress. Eyes: Conjunctivae are normal. EOMI. Head: Atraumatic. Nose: No congestion/rhinnorhea. Mouth/Throat: Mucous membranes are moist.   Neck: No stridor. Trachea Midline. FROM Cardiovascular: Tachycardic, regular rhythm. Grossly normal heart sounds.  Good peripheral circulation. Respiratory: Clear lungs but on 6 L of oxygen with mild increased work of  breathing Gastrointestinal: Soft and nontender. No distention. No abdominal bruits.  Musculoskeletal: No lower extremity tenderness nor edema.  No joint effusions. Neurologic:  Normal speech and language. No gross focal neurologic deficits are appreciated.  Skin:  Skin is warm, dry and intact. No rash noted. Psychiatric: Mood and affect are normal. Speech and behavior are normal. GU: Deferred   ____________________________________________   LABS (all labs ordered are listed, but only abnormal results are displayed)  Labs Reviewed  CBC WITH DIFFERENTIAL/PLATELET - Abnormal; Notable for the following components:      Result Value   WBC 15.0 (*)    RBC 3.35 (*)    Hemoglobin 9.6 (*)    HCT 28.2 (*)    Neutro Abs 12.9 (*)    Monocytes Absolute 1.1 (*)    Abs Immature Granulocytes 0.12 (*)    All other components within normal limits  RESP PANEL BY RT-PCR (FLU A&B, COVID) ARPGX2  CULTURE, BLOOD (ROUTINE X 2)  CULTURE, BLOOD (ROUTINE X 2)  COMPREHENSIVE METABOLIC PANEL  BRAIN NATRIURETIC PEPTIDE  PROCALCITONIN  LACTIC ACID, PLASMA  LACTIC ACID, PLASMA  TROPONIN I (HIGH SENSITIVITY)   ____________________________________________   ED ECG REPORT I, Vanessa [place], the attending physician, personally viewed and interpreted this ECG.  Sinus tachycardia rate of 109, no ST elevation, no T wave versions, normal intervals ____________________________________________  RADIOLOGY Robert Bellow, personally viewed and evaluated these images (plain radiographs) as part of my medical decision making, as well as reviewing the written report by the radiologist.  ED MD interpretation: patchiness bilaterally   Official radiology report(s): DG Chest Portable 1 View  Result Date: 05/30/2021 CLINICAL DATA:  Shortness of breath and productive cough. EXAM: PORTABLE CHEST 1 VIEW COMPARISON:  Chest x-ray dated May 07, 2021. FINDINGS: Unchanged mild cardiomegaly. Worsened diffuse interstitial and  hazy airspace opacities in both lungs. No pneumothorax or large pleural effusion. No acute osseous abnormality. IMPRESSION: Worsened diffuse interstitial and hazy airspace opacities in both lungs concerning for pulmonary edema or multifocal pneumonia superimposed on chronic interstitial lung disease for. Electronically Signed   By: Titus Dubin M.D.   On: 05/16/2021 12:48    ____________________________________________   PROCEDURES  Procedure(s) performed (including Critical Care):  .1-3 Lead EKG  Interpretation  Date/Time: 05/19/2021 12:09 PM Performed by: Vanessa [place], MD Authorized by: Vanessa [place], MD     Interpretation: abnormal     ECG rate:  100s   ECG rate assessment: bradycardic     Rhythm: sinus tachycardia     Ectopy: none     Conduction: normal   .Critical Care  Date/Time: 05/18/2021 12:09 PM Performed by: Vanessa Iselin, MD Authorized by: Vanessa , MD   Critical care provider statement:    Critical care time (minutes):  45   Critical care was necessary to treat or prevent imminent or life-threatening deterioration of the following conditions:  Respiratory failure   Critical care was time spent personally by me on the following activities:  Discussions with consultants, evaluation of patient's response to treatment, examination of patient, ordering and performing treatments and interventions, ordering and review of laboratory studies, ordering and review of radiographic studies, pulse oximetry, re-evaluation of patient's condition, obtaining history from patient or surrogate and review of old charts   ____________________________________________   INITIAL IMPRESSION / [place] / ED COURSE   Nakaila Freeze was evaluated in Emergency Department on 05/17/2021 for the symptoms described in the history of present illness. She was evaluated in the context of the global COVID-19 pandemic, which necessitated consideration that the patient might be at  risk for infection with the SARS-CoV-2 virus that causes COVID-19. Institutional protocols and algorithms that pertain to the evaluation of patients at risk for COVID-19 are in a state of rapid change based on information released by regulatory bodies including the CDC and federal and state organizations. These policies and algorithms were followed during the patient's care in the ED.     Pt presents with SOB.  Most likely secondary to interstitial lung disease.  On examination she does not appear to be fluid overloaded had a normal echo recently given she is hypotensive and tachycardic we will trial some fluid.  We will give a dose of IV Solu-Medrol.  Patient is currently on 6 L of oxygen   PNA-will get xray to evaluation Anemia-CBC to evaluate ACS- will get trops Arrhythmia-Will get EKG and keep on monitor.  COVID- will get testing per algorithm. PE-lower suspicion given no risk factors and other cause more likely  1:03 PM patient's chest x-ray appears to be worsening could be secondary to pneumonia versus fluid.  Again patient had normal echocardiogram just a few weeks ago so it seems less likely to be fluid.  She is hypotensive and we did trial some fluid and remained hypotensive.  Patient however has been mentating fully.  We will do a sepsis alert and start broad-spectrum antibiotics given recent hospital admission  After 1 L fluid administration patient did become more hypoxic and is satting in the 80s.  We will discussed with the respiratory therapist about putting her on high flow nasal cannula.  1:24 PM patient's troponin came back significantly elevated.  Discussed with family has had not had any falls no bleeding issues in the past.  Will start on heparin and give a dose of aspirin.  1:36 PM bedside echocardiogram does appear like her EF may be reduced.  No effusion or right heart strain.  Her BNP is significantly elevated.  But also her procalcitonin is positive.  Discussed with the  ICU doctor [place] who will admit patient.  Started her on some low-dose Levophed for her blood pressures.  Patient appears more comfortable on BiPAP.  Given the rest  of her results I am now more concerned that patient could have had a cardiac event and is now in cardiogenic shock especially since she got worse with the fluid.  Not want to give any additional fluid.  I did discuss with Dr. Saunders Revel from cardiology who is going to evaluate the patient.  Patient will be admitted to the ICU team.  I tried to discuss CODE STATUS with legal guardian which she stated that she was not sure at this time  It is difficult because her procalcitonin is positive and this could be septic shock but given she already deteriorated with fluid I am hesitant to add any more fluid at this time.  However I did cover her with antibiotics due to this possibility     ____________________________________________   FINAL CLINICAL IMPRESSION(S) / ED DIAGNOSES   Final diagnoses:  Acute respiratory failure with hypoxia (HCC)  NSTEMI (non-ST elevated myocardial infarction) (HCC)  Hypotension, unspecified hypotension type     MEDICATIONS GIVEN DURING THIS VISIT:  Medications  vancomycin (VANCOCIN) IVPB 1000 mg/200 mL premix (has no administration in time range)  ceFEPIme (MAXIPIME) 2 g in sodium chloride 0.9 % 100 mL IVPB (has no administration in time range)  aspirin chewable tablet 324 mg (has no administration in time range)  norepinephrine (LEVOPHED) 4mg  in 265mL premix infusion (has no administration in time range)  methylPREDNISolone sodium succinate (SOLU-MEDROL) 125 mg/2 mL injection 125 mg (125 mg Intravenous Given 05/11/2021 1254)  sodium chloride 0.9 % bolus 1,000 mL (0 mLs Intravenous Stopped 05/13/2021 1329)     ED Discharge Orders     None        Note:  This document was prepared using Dragon voice recognition software and may include unintentional dictation errors.   Vanessa Deepstep, MD 05/16/2021  [phone]

## 2021-05-28 NOTE — ED Notes (Signed)
MD aware of pt's VS. Will continue to monitor.

## 2021-05-28 NOTE — Progress Notes (Signed)
CODE SEPSIS - PHARMACY COMMUNICATION  **Broad Spectrum Antibiotics should be administered within 1 hour of Sepsis diagnosis**  Time Code Sepsis Called/Page Received: 1303  Antibiotics Ordered: Vancomycin, Cefepime  Time of 1st antibiotic administration: [place] Gabe Glace ,PharmD Clinical Pharmacist  05/27/2021  2:47 PM

## 2021-05-28 NOTE — ED Provider Notes (Signed)
Responded to stat page for concerns of acute hypoxia.  On arrival, nurse reports there is concern that the patient may have a dislodged endotracheal tube due to concerns of hypoxia.  However, was able to perform laryngoscopy which demonstrates endotracheal tube through the vocal cords.  Also end-tidal CO2 continuous is placed on the patient is endotracheal tube and this demonstrates normal waveform with end-tidal CO2 readings approximately 24-28.  Within a couple minutes ICU physician arrives to the bedside and assumes care.  I did also affirm and check vent settings which demonstrate FiO2 of 100% PEEP of 10.  DG Chest Portable 1 View  Result Date: 05/26/2021 CLINICAL DATA:  Status post intubation. EXAM: PORTABLE CHEST 1 VIEW COMPARISON:  Earlier today. FINDINGS: Interval endotracheal tube in satisfactory position. Stable enlarged cardiac silhouette. Increased diffuse bilateral airspace opacity, more confluent on the right. No visible pleural fluid. Thoracic spine degenerative changes. IMPRESSION: 1. Endotracheal tube in satisfactory position. 2. Increased extensive bilateral pneumonia or alveolar edema. Electronically Signed   By: Claudie Revering M.D.   On: 05/25/2021 16:03     X-ray also reviewed shows endotracheal tube to be in good position.  The patient has severe extensive edema and/or infiltrates on the x-ray.  This was also reviewed with the ICU physician who is also at bedside.   Ongoing care per ICU physician.  I also updated my partner Dr. Kerman Passey and patient's sisters who are present on situation.     Glidescope laryngoscopy  Date/Time: 06/03/2021 4:49 PM Performed by: Delman Kitten, MD Authorized by: Delman Kitten, MD  Consent: The procedure was performed in an emergent situation. Verbal consent not obtained. Written consent not obtained. Patient identity confirmation method: with RN at bedside. Local anesthesia used: no  Anesthesia: Local anesthesia used:  no  Sedation: Patient sedated: no  Patient tolerance: patient tolerated the procedure well with no immediate complications Comments: Laryngoscopy reveals endotracheal tube through the vocal cords.  Within the endotracheal tube however is a frothy slightly bloody appearing sputum.  Also small amount of blood noted in the posterior oropharynx which was able to be suctioned out.       Delman Kitten, MD 05/09/2021 1650

## 2021-05-28 NOTE — H&P (Signed)
Name: Barbara [name] MRN: [id number] DOB: May 08, 1941     CONSULTATION DATE: 05/09/2021  REFERRING MD :  Jari Pigg  CHIEF COMPLAINT:  Chest pain  STUDIES: CXR with bilateral interstitial infiltrates  HISTORY OF PRESENT ILLNESS:  80 yo WF never smoker recently admitted for hypoxic respiratory failure in May, where she was found to have stigmata of interstitial lung disease and concurrent pulmonary edema, now presents with complaints of chest pain There was a concurrent cough productive of some purulent sputum over the past several days. CXR revealed bilateral interstitial edema. A troponin returned in excess of 16k, and cards saw her in the ED with a prelim echo reporting new reduction in EF 30-35%. She failed NIV  for accruing oxygen need, and right before intubation developed some hemoptysis (heparin was initiated for ACS but stopped). With intubation she required a levophed drip.   ER Course As above  PAST MEDICAL HISTORY :   has a past medical history of Allergy, Anemia, Anxiety, Diabetes mellitus, Expressive language disorder, GERD (gastroesophageal reflux disease), Hyperlipidemia, Hypertension, Learning disability, Mentally disabled, Obesity, and Retinal ischemia.  has a past surgical history that includes Cholecystectomy and Cataract extraction (Left, 2014). Prior to Admission medications   Medication Sig Start Date End Date Taking? Authorizing Provider  acetaminophen (TYLENOL) 500 MG tablet Take 500-1,000 mg by mouth 2 (two) times daily.   Yes [provider]  albuterol (VENTOLIN HFA) 108 (90 Base) MCG/ACT inhaler Inhale 2 puffs into the lungs every 6 (six) hours as needed for wheezing or shortness of breath. 05/09/21  Yes Fritzi Mandes, MD  alendronate (FOSAMAX) 70 MG tablet TAKE 1 TABLET BY MOUTH 30 MINUTES BEFOREBREAKFAST ONCE A WEEK WITH ATLEAST 8 OZ. OF WATER. Patient taking differently: Take 70 mg by mouth every 7 (seven) days. 12/03/20  Yes Tower, Wynelle Fanny, MD   amLODipine (NORVASC) 10 MG tablet Take 1 tablet (10 mg total) by mouth daily. 12/03/20  Yes Tower, Wynelle Fanny, MD  Calcium Carb-Cholecalciferol 500-400 MG-UNIT CHEW Take 1 tablet by mouth daily.   Yes [provider]  ferrous sulfate 325 (65 FE) MG tablet Take 1 tablet (325 mg total) by mouth daily with breakfast. 02/07/13  Yes Gatha Mayer, MD  fexofenadine (ALLEGRA) 180 MG tablet Take 180 mg by mouth daily as needed for allergies.   Yes [provider]  fluticasone (FLONASE) 50 MCG/ACT nasal spray USE TWO SPRAYS IN EACH NOSTRIL DAILY Patient taking differently: Place 2 sprays into both nostrils daily as needed for allergies. 10/10/19  Yes Tower, Wynelle Fanny, MD  gemfibrozil (LOPID) 600 MG tablet Take 1 tablet (600 mg total) by mouth 2 (two) times daily. 12/03/20  Yes Tower, Wynelle Fanny, MD  levothyroxine (SYNTHROID) 100 MCG tablet TAKE 1 TABLET BY MOUTH DAILY BEFORE BREAKFAST. Patient taking differently: Take 100 mcg by mouth daily before breakfast. 12/03/20  Yes Tower, Wynelle Fanny, MD  metFORMIN (GLUCOPHAGE) 500 MG tablet TAKE 1 TABLET BY MOUTH EVERY DAY WITH BREAKFAST Patient taking differently: Take 500 mg by mouth daily with breakfast. 12/03/20  Yes Tower, Wynelle Fanny, MD  nystatin (MYCOSTATIN/NYSTOP) powder Apply topically 2 (two) times daily as needed. To itchy rash under breasts Patient taking differently: Apply 1 application topically 2 (two) times daily as needed (rash under the breasts). 12/03/20  Yes Tower, Wynelle Fanny, MD  omeprazole (PRILOSEC) 20 MG capsule TAKE ONE CAPSULE BY MOUTH DAILY BEFORE BREAKFAST Patient taking differently: Take 20 mg by mouth daily before breakfast. 12/03/20  Yes Tower,  Wynelle Fanny, MD  quinapril (ACCUPRIL) 40 MG tablet TAKE ONE-HALF (0.5) BY MOUTH DAILY Patient taking differently: Take 20 mg by mouth daily. 12/03/20  Yes Tower, Wynelle Fanny, MD  sertraline (ZOLOFT) 50 MG tablet Take 1 tablet (50 mg total) by mouth daily. 12/03/20  Yes Tower, Wynelle Fanny, MD  triamcinolone  cream (KENALOG) 0.1 % Apply 1 application topically 2 (two) times daily. Patient taking differently: Apply 1 application topically 2 (two) times daily as needed (skin irritation). 10/07/20  Yes Baity, Coralie Keens, NP  vitamin B-12 (CYANOCOBALAMIN) 1000 MCG tablet Take 1 tablet (1,000 mcg total) by mouth daily. 02/07/13  Yes Gatha Mayer, MD  clotrimazole (LOTRIMIN) 1 % cream Apply 1 application topically 2 (two) times daily. To itchy genital areas Patient taking differently: Apply 1 application topically 2 (two) times daily as needed (itchy genital area). 12/03/20   Tower, Wynelle Fanny, MD  predniSONE (DELTASONE) 10 MG tablet Take 5 tablets (50 mg total) by mouth daily with breakfast. 50 mg daily taper by 10 mg daily then stop Patient not taking: Reported on 05/10/2021 05/10/21   Fritzi Mandes, MD   Allergies  Allergen Reactions  . Atorvastatin     REACTION: increased liver fxn tests  . Ezetimibe     REACTION: stomach upset/malaise  . Sulfonamide Derivatives     REACTION: rash    FAMILY HISTORY:  family history includes Diabetes in her brother; Heart disease in her brother and father; Hypertension in her father. SOCIAL HISTORY:  reports that she has never smoked. She has never used smokeless tobacco. She reports that she does not drink alcohol and does not use drugs.  REVIEW OF SYSTEMS:   Unable to obtain due to critical illness, intubated     Estimated body mass index is 19.27 kg/m as calculated from the following:   Height as of this encounter: 5\' 1"  (1.549 m).   Weight as of this encounter: 46.3 kg.    VITAL SIGNS: Temp:  [97 F (36.1 C)-99.5 F (37.5 C)] 99.5 F (37.5 C) (06/24 1730) Pulse Rate:  [96-142] 133 (06/24 1730) Resp:  [12-58] 26 (06/24 1730) BP: (70-104)/(47-76) 89/66 (06/24 1730) SpO2:  [79 %-99 %] 89 % (06/24 1730) FiO2 (%):  [97 %-100 %] 100 % (06/24 1510) Weight:  [46.3 kg] 46.3 kg (06/24 1153)   No intake/output data recorded. Total I/O In: 1000 [IV  Piggyback:1000] Out: -    SpO2: (!) 89 % O2 Flow Rate (L/min): 15 L/min FiO2 (%): 100 %   Physical Examination:  GENERAL:critically ill appearing, thin HEAD: Normocephalic, atraumatic.  EYES: Pupils equal, round, reactive to light.  No scleral icterus.  MOUTH: Moist mucosal membrane. NECK: Supple.  JVD noted.  PULMONARY: ETT/MV +rhonchi, frank blood in ETT CARDIOVASCULAR: tachy S1 and S2. Regular rate and rhythm. No murmurs, rubs, or gallops.  GASTROINTESTINAL: Soft, nontender, -distended.  diminished bowel sounds.  MUSCULOSKELETAL: No swelling, clubbing, or edema, petechiae noted on tibial plateaus to knees NEUROLOGIC: obtunded SKIN:intact,warm,dry  I personally reviewed lab work that was obtained in last 24 hrs. CXR Independently reviewed, bilateral interstitial edema  MEDICATIONS: I have reviewed all medications and confirmed regimen as documented   CULTURE RESULTS   Recent Results (from the past 240 hour(s))  Resp Panel by RT-PCR (Flu A&B, Covid) Nasopharyngeal Swab     Status: None   Collection Time: 05/13/2021 12:12 PM   Specimen: Nasopharyngeal Swab; Nasopharyngeal(NP) swabs in vial transport medium  Result Value Ref Range Status   SARS Coronavirus  2 by RT PCR NEGATIVE NEGATIVE Final    Comment: (NOTE) SARS-CoV-2 target nucleic acids are NOT DETECTED.  The SARS-CoV-2 RNA is generally detectable in upper respiratory specimens during the acute phase of infection. The lowest concentration of SARS-CoV-2 viral copies this assay can detect is 138 copies/mL. A negative result does not preclude SARS-Cov-2 infection and should not be used as the sole basis for treatment or other patient management decisions. A negative result may occur with  improper specimen collection/handling, submission of specimen other than nasopharyngeal swab, presence of viral mutation(s) within the areas targeted by this assay, and inadequate number of viral copies(<138 copies/mL). A negative  result must be combined with clinical observations, patient history, and epidemiological information. The expected result is Negative.  Fact Sheet for Patients:  EntrepreneurPulse.com.au  Fact Sheet for Healthcare Providers:  IncredibleEmployment.be  This test is no t yet approved or cleared by the [place] FDA and  has been authorized for detection and/or diagnosis of SARS-CoV-2 by FDA under an Emergency Use Authorization (EUA). This EUA will remain  in effect (meaning this test can be used) for the duration of the COVID-19 declaration under Section 564(b)(1) of the Act, 21 U.S.C.section 360bbb-3(b)(1), unless the authorization is terminated  or revoked sooner.       Influenza A by PCR NEGATIVE NEGATIVE Final   Influenza B by PCR NEGATIVE NEGATIVE Final    Comment: (NOTE) The Xpert Xpress SARS-CoV-2/FLU/RSV plus assay is intended as an aid in the diagnosis of influenza from Nasopharyngeal swab specimens and should not be used as a sole basis for treatment. Nasal washings and aspirates are unacceptable for Xpert Xpress SARS-CoV-2/FLU/RSV testing.  Fact Sheet for Patients: EntrepreneurPulse.com.au  Fact Sheet for Healthcare Providers: IncredibleEmployment.be  This test is not yet approved or cleared by the [place] FDA and has been authorized for detection and/or diagnosis of SARS-CoV-2 by FDA under an Emergency Use Authorization (EUA). This EUA will remain in effect (meaning this test can be used) for the duration of the COVID-19 declaration under Section 564(b)(1) of the Act, 21 U.S.C. section 360bbb-3(b)(1), unless the authorization is terminated or revoked.  Performed at [facility], [address] [place], [place] [address]           IMAGING    DG Chest Port 1 View  Result Date: 05/30/2021 CLINICAL DATA:  Encounter for central line placement. EXAM: PORTABLE CHEST 1  VIEW COMPARISON:  May 28, 2021. FINDINGS: LEFT IJ central line placed since the previous study terminates in the area of the caval to atrial junction. Endotracheal tube terminates in the mid trachea approximately 3.9 cm from the carina. EKG leads project over the chest. Gastric tube courses through in off the field of the radiograph. Pacer defibrillator pads project over the LEFT chest and upper abdomen. Stable appearance of the chest with diffuse interstitial and airspace opacities. Cardiomediastinal contours remain obscured. On limited assessment no acute skeletal process. IMPRESSION: 1. LEFT IJ central line placed since the previous study, terminating in the area of the caval to atrial junction. 2. Otherwise no change in the appearance of the chest with diffuse interstitial and airspace opacities in this intubated patient. 3. Interval placement of a gastric tube, tip off the field of view. Electronically Signed   By: Zetta Bills M.D.   On: 05/26/2021 16:48   DG Chest Portable 1 View  Result Date: 05/29/2021 CLINICAL DATA:  Status post intubation. EXAM: PORTABLE CHEST 1 VIEW COMPARISON:  Earlier today. FINDINGS: Interval  endotracheal tube in satisfactory position. Stable enlarged cardiac silhouette. Increased diffuse bilateral airspace opacity, more confluent on the right. No visible pleural fluid. Thoracic spine degenerative changes. IMPRESSION: 1. Endotracheal tube in satisfactory position. 2. Increased extensive bilateral pneumonia or alveolar edema. Electronically Signed   By: Claudie Revering M.D.   On: 05/18/2021 16:03   DG Chest Portable 1 View  Result Date: 05/27/2021 CLINICAL DATA:  Shortness of breath and productive cough. EXAM: PORTABLE CHEST 1 VIEW COMPARISON:  Chest x-ray dated May 07, 2021. FINDINGS: Unchanged mild cardiomegaly. Worsened diffuse interstitial and hazy airspace opacities in both lungs. No pneumothorax or large pleural effusion. No acute osseous abnormality. IMPRESSION: Worsened  diffuse interstitial and hazy airspace opacities in both lungs concerning for pulmonary edema or multifocal pneumonia superimposed on chronic interstitial lung disease for. Electronically Signed   By: Titus Dubin M.D.   On: 05/30/2021 12:48     Nutrition Status:         Indwelling Urinary Catheter continued, requirement due to   Reason to continue Indwelling Urinary Catheter strict Intake/Output monitoring for hemodynamic instability   Central Line/ continued, requirement due to  Reason to continue [place] of central venous pressure or other hemodynamic parameters and poor IV access   Ventilator continued, requirement due to severe respiratory failure   Ventilator Sedation RASS 0 to -2      ASSESSMENT AND PLAN SYNOPSIS  RESPIRATORY Severe ACUTE Hypoxic and Hypercapnic Respiratory Failure: ILD, acute exacerbation? ARDS? vs Cardiogenic edema?  Hemoptysis/bronchiectasis   -continue Full MV support -continue Bronchodilator Therapy -Wean Fio2 and PEEP as tolerated -Peak pressures 50s upon arrival in the ED -Vent swap to servo decreased VT from 450 to 300 rate from 16 to 24 -Itime lengthened 1.5:1 and PEEP deceased to 8, peak fell to 35, plat 33-4 -bronch suggestive of RML as region for hemorrhage, but airway both L/R spackled with blood -VAP/VENT bundle implementation  CARDIAC/ACUTE SYSTOLIC CARDIAC FAILURE: ECHO 05/06/21  1. Left ventricular ejection fraction, by estimation, is 55 to 60%. The  left ventricle has normal function. The left ventricle has no regional  wall motion abnormalities. Left ventricular diastolic parameters were  normal.   2. Right ventricular systolic function is normal. The right ventricular  size is normal.   3. The mitral valve is normal in structure. No evidence of mitral valve  regurgitation.   4. The aortic valve is grossly normal. Aortic valve regurgitation is not  visualized.  -ETT/MV -presenting troponin >16k, no STEMI  on EKG, but suggestive of ischemia  -Repeat echo -NSTEMI, unable to anticoagulate with pulmonary hemorrhage  -Lasix as hemodynamics and renal indices tolerate -follow up cardiac enzymes as indicated -pressor for MAP 60-65, if tachy swap levo to neo -follow up cardiology recs -Lactic <2  ACUTE KIDNEY INJURY/Renal Failure Lab Results  Component Value Date   CREATININE 1.82 (H) 05/12/2021   BUN 40 (H) 05/08/2021   NA 130 (L) 05/31/2021   K 3.6 05/12/2021   CL 95 (L) 05/26/2021   CO2 21 (L) 05/16/2021   HypoNa AKI Mild AGMA, lactic thus far <2 -continue Foley Catheter-assess need -Avoid nephrotoxic agents -Follow urine output, BMP -Ensure adequate renal perfusion, optimize oxygenation -Renal dose medications  ID -cefepime and vanc up front -continue IV abx as prescibed -follow up cultures   NEUROLOGY - intubated and sedated - minimal sedation to achieve a RASS goal: -1 Wake up assessment pending  Acute toxic metabolic encephalopathy, need for sedation Goal RASS -2 to -3  GI GI PROPHYLAXIS as indicated  NUTRITIONAL STATUS DIET-->TF's as tolerated Constipation protocol as indicated   ENDO - will use ICU hypoglycemic\Hyperglycemia protocol if needed    ELECTROLYTES -follow labs as needed -replace as needed -pharmacy consultation and following    DVT/GI PRX ordered and assessed TRANSFUSIONS AS NEEDED MONITOR FSBS I Assessed the need for Labs I Assessed the need for Foley I Assessed the need for Central Venous Line Family Discussion when available I Assessed the need for Mobilization I made an Assessment of medications to be adjusted accordingly Safety Risk assessment Completed  CASE DISCUSSED IN MULTIDISCIPLINARY ROUNDS WITH ICU TEAM   Critical Care Time devoted to patient care services described in this note is 120 minutes.   Overall, patient is critically ill, prognosis is guarded.  Patient with Multiorgan failure and at high risk for cardiac  arrest and death.

## 2021-05-28 NOTE — ED Notes (Signed)
Barbara [name], lab critical troponin [phone]

## 2021-05-28 NOTE — Sepsis Progress Note (Signed)
Sent ED RN a secure chat about order for additional LA

## 2021-05-28 NOTE — Consult Note (Signed)
Pharmacy Antibiotic Note  Barbara [name] is a 80 y.o. female admitted on 05/24/2021 with sepsis/Acute respiratory failure/chest pain. CXR revealed bilateral interstitial edema. A troponin returned in excess of 16k,  Pharmacy has been consulted for Vancomycin dosing. Patient also ordered cefepime   Patient with AKI/ Scr 1.82 (baseline ~ 0.9)  Plan: Continue cefepime 2 gram Q24H as ordered Vancomycin 1000 mg LD x 1 given in ED Given AKI, will hold off scheduling maintenance regimen at this time. Estimated regimen Q48H with current renal function Monitor renal function Follow up cultures  Height: 5\' 1"  (154.9 cm) Weight: 46.3 kg (102 lb) IBW/kg (Calculated) : 47.8  Temp (24hrs), Avg:98.9 F (37.2 C), Min:97 F (36.1 C), Max:99.5 F (37.5 C)  Recent Labs  Lab 05/23/2021 1212 05/19/2021 1350  WBC 15.0*  --   CREATININE 1.82*  --   LATICACIDVEN  --  1.5    Estimated Creatinine Clearance: 18.3 mL/min (A) (by C-G formula based on SCr of 1.82 mg/dL (H)).    Allergies  Allergen Reactions   Atorvastatin     REACTION: increased liver fxn tests   Ezetimibe     REACTION: stomach upset/malaise   Sulfonamide Derivatives     REACTION: rash    Antimicrobials this admission: Vancomycin 6/24 >>  Cefepime 6/24 >>   Dose adjustments this admission: N/a  Microbiology results: 6/24 BCx: sent 6/24 UCx: sent  6/24 Sputum: sent  6/24 MRSA PCR: sent  Thank you for allowing pharmacy to be a part of this patient's care.  Dorothe Pea, PharmD, BCPS Clinical Pharmacist   05/17/2021 6:39 PM

## 2021-05-28 NOTE — Consult Note (Signed)
PHARMACY CONSULT NOTE - FOLLOW UP  Pharmacy Consult for Electrolyte Monitoring and Replacement   Recent Labs: Potassium (mmol/L)  Date Value  05/14/2021 3.6   Magnesium (mg/dL)  Date Value  05/05/2021 1.8   Calcium (mg/dL)  Date Value  05/07/2021 8.8 (L)   Albumin (g/dL)  Date Value  05/12/2021 3.3 (L)   Phosphorus (mg/dL)  Date Value  06/20/2011 3.4   Sodium (mmol/L)  Date Value  05/24/2021 130 (L)     Assessment: Pharmacy has been consulted to monitor and replace electrolytes in 80yo patient who presented to the ED with chest pain and is being evaluated for NSTEMI complicated by cardiogenic shock with acute HFrEF by cardiology.  Goal of Therapy:  Electrolytes WNL  Plan:  No supplementation warranted currently. Will follow repeat electrolytes with AM labs.  Pearla Dubonnet ,PharmD Clinical Pharmacist 05/13/2021 6:03 PM

## 2021-05-28 NOTE — Progress Notes (Signed)
Repeat ABG revealed pH 7.11/pCO2 68/pO2 88/acid-base deficit 8.2/bicarb 21.6.  Attempted to adjust ventilator settings, however peak pressures increased pt placed back on previous settings TV 320 and Rate 26.  Orders placed to administer 3 amps of sodium bicarb/4 mg of versed/75 mcg of fentanyl.  Repeat ABG pending for 2100.  Will continue to monitor and assess pt.  Rosilyn Mings, AGNP  Pulmonary/Critical Care Pager [phone] (please enter 7 digits) PCCM Consult Pager [phone] (please enter 7 digits)

## 2021-05-28 NOTE — ED Notes (Signed)
MD, RT and RN at bedside. Updating pt and family on POC.

## 2021-05-28 NOTE — ED Triage Notes (Signed)
Pt arrives POV with sister. Pt was brought from [facility] for a scheduled appointment. Staff reports that pt arrived with SpO2 of 82% RA. Pt wears 2L O2 intermittently at home. Pt SpO2 79% on 2L; 86% 6L.

## 2021-05-28 NOTE — Procedures (Signed)
Diagnostic Bronchoscopy Procedure Note   Barbara [name]  [id number]  1941-09-08  Date:05/07/2021  Time:6:35 PM   Provider Performing:Rayana Geurin Merrilee Jansky  Procedure: Diagnostic Bronchoscopy [phone])  Indication(s) Hemoptysis   Consent Risks of the procedure as well as the alternatives and risks of each were explained to the patient and/or caregiver.  Consent for the procedure was obtained.  Time Out Verified patient identification, verified procedure, site/side was marked, verified correct patient position, special equipment/implants available, medications/allergies/relevant history reviewed, required imaging and test results available.  Sterile Technique Usual hand hygiene, masks, gowns, and gloves were used  Procedure Description Bronchoscope advanced through endotracheal tube and into airway.  After suctioning out tracheal secretions, bronchoscope used to provide direct visualization of tracheostomy placement.  Complications/Tolerance None; patient tolerated the procedure well..  EBL Scant  Specimen(s) None  Scope introduced via ETT. Airway survey incomplete for hypoxia. Hemoptysis had spackled all airways. Washed, and perhaps the blood coming from RML, although cannot be sure. LEFT side did appear quiet.  Scope withdrawn when sats fell to 82% recovered to 88-89%

## 2021-05-28 NOTE — ED Provider Notes (Signed)
-----------------------------------------   3:23 PM on 05/11/2021 ----------------------------------------- Patient care assumed from Dr. Jari Pigg.  Patient satting 83% on 100% FiO2 BiPAP.  Spoke to the patient's family member, jointly agreed to proceed with intubation.  Wishes to keep the patient a full code as well.  Blood pressure 79/48.  Intubation performed by myself without issue on first.  We will sedate with propofol and assist pressures with Levophed.  Patient is admitted to the intensive care unit.  INTUBATION Performed by: Harvest Dark  Required items: required blood products, implants, devices, and special equipment available Patient identity confirmed: provided demographic data and hospital-assigned identification number Time out: Immediately prior to procedure a "time out" was called to verify the correct patient, procedure, equipment, support staff and site/side marked as required.  Indications: Respiratory failure  Intubation method: S3 Glidescope Laryngoscopy   Preoxygenation: 100% BVM  Sedatives: 20 mg etomidate Paralytic: 100 mg rocuronium  Tube Size: 7.0 cuffed  Post-procedure assessment: chest rise and ETCO2 monitor Breath sounds: equal and absent over the epigastrium Tube secured with: ETT holder Chest x-ray interpreted by radiologist and me.  Chest x-ray findings: endotracheal tube in appropriate position  Patient tolerated the procedure well with no immediate complications.     Harvest Dark, MD 05/31/2021 1525

## 2021-05-28 NOTE — Procedures (Signed)
Central Venous Catheter Insertion Procedure Note  Barbara [name]  [id number]  09/10/1941  Date:05/19/2021  Time:4:13 PM   Provider Performing:Ahmet Schank D Dewaine Conger   Procedure: Insertion of Non-tunneled Central Venous [phone]) with US guidance ([id number])   Indication(s) Medication administration and Difficult access  Consent Risks of the procedure as well as the alternatives and risks of each were explained to the patient and/or caregiver.  Consent for the procedure was obtained and is signed in the bedside chart  Anesthesia Topical only with 1% lidocaine   Timeout Verified patient identification, verified procedure, site/side was marked, verified correct patient position, special equipment/implants available, medications/allergies/relevant history reviewed, required imaging and test results available.  Sterile Technique Maximal sterile technique including full sterile barrier drape, hand hygiene, sterile gown, sterile gloves, mask, hair covering, sterile ultrasound probe cover (if used).  Procedure Description Area of catheter insertion was cleaned with chlorhexidine and draped in sterile fashion.  With real-time ultrasound guidance a central venous catheter was placed into the left internal jugular vein. Nonpulsatile blood flow and easy flushing noted in all ports.  The catheter was sutured in place and sterile dressing applied.  Complications/Tolerance None; patient tolerated the procedure well. Chest X-ray is ordered to verify placement for internal jugular or subclavian cannulation.   Chest x-ray is not ordered for femoral cannulation.  EBL Minimal  Specimen(s) None   Line secured at the 19 cm mark.    BIOPATCH applied to the insertion site.   Darel Hong, AGACNP-BC [facility] Prefer epic messenger for cross cover needs If after hours, please call E-link

## 2021-05-28 NOTE — Care Plan (Signed)
CODE STATUS  D/W the patient's sisters as the patient is now obtunded  In light of opposing issues with hemoptysis and severe restrictive lung disease along with ACS. The patient would require anticoagulant/antiplatelet to adequately address and treat her chest pain. Hemoptysis is precluding use of these meds as her vent pressures skyrocketed with hemoptysis. A bronch was done, clots and blood were removed, perhaps the RML is source. She is too sick to attend IR suite or be transferred.   Family wishes that she be made DNR, but continue present care to see where we go. Likely they will make her comfort measures if she is not responding by morning.  //Nel Stoneking

## 2021-05-28 NOTE — Consult Note (Signed)
PHARMACY -  BRIEF ANTIBIOTIC NOTE   Pharmacy has received consult(s) for Vancomycin and Cefepime from an ED provider.  The patient's profile has been reviewed for ht/wt/allergies/indication/available labs.    One time order(s) placed for Vancomycin 1g IV and Cefepime 2g IV x 1 dose each.  Further antibiotics/pharmacy consults should be ordered by admitting physician if indicated.                       Thank you, Pearla Dubonnet 05/07/2021  1:10 PM

## 2021-05-28 NOTE — ED Notes (Signed)
Cardiology at bedside.

## 2021-05-28 NOTE — Sepsis Progress Note (Signed)
eLink following sepsis protocol

## 2021-05-29 ENCOUNTER — Inpatient Hospital Stay: Payer: Medicare HMO

## 2021-05-29 DIAGNOSIS — J810 Acute pulmonary edema: Secondary | ICD-10-CM

## 2021-05-29 DIAGNOSIS — I5021 Acute systolic (congestive) heart failure: Secondary | ICD-10-CM

## 2021-05-29 DIAGNOSIS — I214 Non-ST elevation (NSTEMI) myocardial infarction: Secondary | ICD-10-CM

## 2021-05-29 LAB — BLOOD GAS, ARTERIAL
Acid-Base Excess: 2.5 mmol/L — ABNORMAL HIGH (ref 0.0–2.0)
Acid-base deficit: 2.4 mmol/L — ABNORMAL HIGH (ref 0.0–2.0)
Bicarbonate: 27 mmol/L (ref 20.0–28.0)
Bicarbonate: 30.5 mmol/L — ABNORMAL HIGH (ref 20.0–28.0)
FIO2: 1
FIO2: 0.8
MECHVT: 320 mL
MECHVT: 320 mL
O2 Saturation: 96.1 %
O2 Saturation: 88.9 %
PEEP: 8 cmH2O
PEEP: 5 cmH2O
Patient temperature: 37
Patient temperature: 37
RATE: 26 resp/min
RATE: 26 resp/min
pCO2 arterial: 74 mmHg (ref 32.0–48.0)
pCO2 arterial: 68 mmHg (ref 32.0–48.0)
pH, Arterial: 7.17 — CL (ref 7.350–7.450)
pH, Arterial: 7.26 — ABNORMAL LOW (ref 7.350–7.450)
pO2, Arterial: 102 mmHg (ref 83.0–108.0)
pO2, Arterial: 65 mmHg — ABNORMAL LOW (ref 83.0–108.0)

## 2021-05-29 LAB — BLOOD CULTURE ID PANEL (REFLEXED) - BCID2

## 2021-05-29 LAB — PROCALCITONIN: Procalcitonin: 9.46 ng/mL

## 2021-05-29 LAB — GLUCOSE, CAPILLARY
Glucose-Capillary: 279 mg/dL — ABNORMAL HIGH (ref 70–99)
Glucose-Capillary: 216 mg/dL — ABNORMAL HIGH (ref 70–99)
Glucose-Capillary: 214 mg/dL — ABNORMAL HIGH (ref 70–99)
Glucose-Capillary: 209 mg/dL — ABNORMAL HIGH (ref 70–99)
Glucose-Capillary: 217 mg/dL — ABNORMAL HIGH (ref 70–99)
Glucose-Capillary: 190 mg/dL — ABNORMAL HIGH (ref 70–99)
Glucose-Capillary: 182 mg/dL — ABNORMAL HIGH (ref 70–99)

## 2021-05-29 LAB — BASIC METABOLIC PANEL
Anion gap: 11 (ref 5–15)
BUN: 48 mg/dL — ABNORMAL HIGH (ref 8–23)
CO2: 26 mmol/L (ref 22–32)
Calcium: 7.7 mg/dL — ABNORMAL LOW (ref 8.9–10.3)
Chloride: 99 mmol/L (ref 98–111)
Creatinine, Ser: 1.91 mg/dL — ABNORMAL HIGH (ref 0.44–1.00)
GFR, Estimated: 26 mL/min — ABNORMAL LOW (ref >60)
Glucose, Bld: 289 mg/dL — ABNORMAL HIGH (ref 70–99)
Potassium: 3.1 mmol/L — ABNORMAL LOW (ref 3.5–5.1)
Sodium: 136 mmol/L (ref 135–145)

## 2021-05-29 LAB — MAGNESIUM: Magnesium: 1.9 mg/dL (ref 1.7–2.4)

## 2021-05-29 LAB — POTASSIUM: Potassium: 3.6 mmol/L (ref 3.5–5.1)

## 2021-05-29 LAB — PHOSPHORUS: Phosphorus: 7.9 mg/dL — ABNORMAL HIGH (ref 2.5–4.6)

## 2021-05-29 LAB — TROPONIN I (HIGH SENSITIVITY): Troponin I (High Sensitivity): 12910 ng/L (ref <18)

## 2021-05-29 MED ORDER — SODIUM BICARBONATE 8.4 % IV SOLN
50.0000 meq | Freq: Once | INTRAVENOUS | Status: AC
  Administered 2021-05-29: 50 meq via INTRAVENOUS
  Filled 2021-05-29: qty 50

## 2021-05-29 MED ORDER — SODIUM CHLORIDE 0.9 % IV SOLN
INTRAVENOUS | Status: DC | PRN
  Administered 2021-05-29 – 2021-05-30 (×2): 250 mL via INTRAVENOUS

## 2021-05-29 MED ORDER — ALBUMIN HUMAN 25 % IV SOLN
25.0000 g | Freq: Once | INTRAVENOUS | Status: AC
  Administered 2021-05-29: 25 g via INTRAVENOUS
  Filled 2021-05-29: qty 100

## 2021-05-29 MED ORDER — SODIUM CHLORIDE 0.9 % IV SOLN
2.0000 g | INTRAVENOUS | Status: DC
  Administered 2021-05-29 – 2021-05-31 (×3): 2 g via INTRAVENOUS
  Filled 2021-05-29 (×3): qty 2

## 2021-05-29 MED ORDER — LEVALBUTEROL HCL 0.63 MG/3ML IN NEBU
0.6300 mg | INHALATION_SOLUTION | Freq: Four times a day (QID) | RESPIRATORY_TRACT | Status: DC
  Administered 2021-05-30 – 2021-06-01 (×9): 0.63 mg via RESPIRATORY_TRACT
  Filled 2021-05-29 (×9): qty 3

## 2021-05-29 MED ORDER — POTASSIUM CHLORIDE 10 MEQ/50ML IV SOLN
10.0000 meq | INTRAVENOUS | Status: AC
  Administered 2021-05-29 (×6): 10 meq via INTRAVENOUS
  Filled 2021-05-29 (×6): qty 50

## 2021-05-29 NOTE — Progress Notes (Signed)
Progress Note  Patient Name: Barbara [name] Date of Encounter: 05/29/2021  [facility] Cardiologist: Dr. Saunders Revel  Subjective   Intubated, sedated, requiring 3 pressors.  Inpatient Medications    Scheduled Meds:  albuterol  2.5 mg Nebulization Q4H   aspirin  324 mg Oral Once   chlorhexidine gluconate (MEDLINE KIT)  15 mL Mouth Rinse BID   Chlorhexidine Gluconate Cloth  6 each Topical Daily   insulin aspart  0-9 Units Subcutaneous Q4H   mouth rinse  15 mL Mouth Rinse 10 times per day   methylPREDNISolone (SOLU-MEDROL) injection  60 mg Intravenous Q6H   pantoprazole (PROTONIX) IV  40 mg Intravenous QHS   sodium chloride flush  10-40 mL Intracatheter Q12H   Continuous Infusions:  sodium chloride 10 mL/hr at 05/29/21 1413   cefTRIAXone (ROCEPHIN)  IV Stopped (05/29/21 0314)   norepinephrine (LEVOPHED) Adult infusion 24.96 mcg/min (05/29/21 1413)   phenylephrine (NEO-SYNEPHRINE) Adult infusion 250 mcg/min (05/29/21 1413)   propofol (DIPRIVAN) infusion 20 mcg/kg/min (05/29/21 1413)   vasopressin 0.03 Units/min (05/29/21 1413)   PRN Meds: sodium chloride, docusate sodium, fentaNYL (SUBLIMAZE) injection, midazolam, polyethylene glycol, sodium chloride flush, vecuronium   Vital Signs    Vitals:   05/29/21 1230 05/29/21 1300 05/29/21 1330 05/29/21 1430  BP: _0 94/68  Pulse: (!) 126 (!) 131 (!) 131 (!) 130  Resp: (!) 23 (!) 26 (!) 26 (!) 26  Temp: 99.68 F (37.6 C) 99.68 F (37.6 C) 99.68 F (37.6 C) 99.68 F (37.6 C)  TempSrc:      SpO2: 99% 98% 97% 97%  Weight:      Height:        Intake/Output Summary (Last 24 hours) at 05/29/2021 1524 Last data filed at 05/29/2021 1413 Gross per 24 hour  Intake 2095.42 ml  Output 400 ml  Net 1695.42 ml   Last 3 Weights 05/29/2021 05/24/2021 05/23/2021  Weight (lbs) 129 lb 13.6 oz 127 lb 3.3 oz 102 lb  Weight (kg) 58.9 kg 57.7 kg 46.267 kg      Telemetry    Sinus tachycardia- Personally Reviewed  ECG     No new ECG- Personally Reviewed  Physical Exam   GEN: Intubated, sedated Neck: No JVD Cardiac: Tachycardic, distant heart sounds Respiratory: Vented breath sounds GI: Soft, nontender, non-distended  MS: No edema;  Neuro: Unable to assess Psych: Unable to assess  Labs    High Sensitivity Troponin:   Recent Labs  Lab 05/17/2021 1212 05/21/2021 1350 05/13/2021 2016 05/05/2021 2221 05/22/2021 2339  TROPONINIHS 01,007* 14,468* 8,867* 13,350* 12,910*      Chemistry Recent Labs  Lab 06/03/2021 1212 05/29/21 0353 05/29/21 1245  NA 130* 136  --   K 3.6 3.1* 3.6  CL 95* 99  --   CO2 21* 26  --   GLUCOSE 207* 289*  --   BUN 40* 48*  --   CREATININE 1.82* 1.91*  --   CALCIUM 8.8* 7.7*  --   PROT 7.2  --   --   ALBUMIN 3.3*  --   --   AST 137*  --   --   ALT 25  --   --   ALKPHOS 63  --   --   BILITOT 0.9  --   --   GFRNONAA 28* 26*  --   ANIONGAP 14 11  --      Hematology Recent Labs  Lab 05/26/2021 1212 05/26/2021 2017  WBC 15.0*  --   RBC  3.35*  --   HGB 9.6* 8.9*  HCT 28.2* 25.6*  MCV 84.2  --   MCH 28.7  --   MCHC 34.0  --   RDW 14.1  --   PLT 301  --     BNP Recent Labs  Lab 05/20/2021 1212  BNP 1,699.7*     DDimer No results for input(s): DDIMER in the last 168 hours.   Radiology    DG Chest 1 View  Result Date: 05/29/2021 CLINICAL DATA:  Pneumothorax EXAM: CHEST  1 VIEW COMPARISON:  05/31/2021 FINDINGS: Endotracheal tube seen 4.8 cm above the carina. Nasogastric tube extends into the upper abdomen beyond the margin of the examination. Left internal jugular central venous catheter tip noted within the superior vena cava. The lungs are symmetrically well expanded, stable since prior examination. Extensive, diffuse airspace infiltrate is again seen throughout the lungs, stable since prior examination, in keeping with diffuse pulmonary hemorrhage, pulmonary edema, or inflammatory infiltrate. No pneumothorax or pleural effusion. Cardiac size within normal limits.  No acute bone abnormality. IMPRESSION: Support lines and tubes in appropriate position. No pneumothorax. Stable diffuse, extensive airspace infiltrate. Electronically Signed   By: Fidela Salisbury MD   On: 05/29/2021 00:56   DG Chest Port 1 View  Result Date: 05/18/2021 CLINICAL DATA:  Encounter for central line placement. EXAM: PORTABLE CHEST 1 VIEW COMPARISON:  May 28, 2021. FINDINGS: LEFT IJ central line placed since the previous study terminates in the area of the caval to atrial junction. Endotracheal tube terminates in the mid trachea approximately 3.9 cm from the carina. EKG leads project over the chest. Gastric tube courses through in off the field of the radiograph. Pacer defibrillator pads project over the LEFT chest and upper abdomen. Stable appearance of the chest with diffuse interstitial and airspace opacities. Cardiomediastinal contours remain obscured. On limited assessment no acute skeletal process. IMPRESSION: 1. LEFT IJ central line placed since the previous study, terminating in the area of the caval to atrial junction. 2. Otherwise no change in the appearance of the chest with diffuse interstitial and airspace opacities in this intubated patient. 3. Interval placement of a gastric tube, tip off the field of view. Electronically Signed   By: Zetta Bills M.D.   On: 05/21/2021 16:48   DG Chest Portable 1 View  Result Date: 05/22/2021 CLINICAL DATA:  Status post intubation. EXAM: PORTABLE CHEST 1 VIEW COMPARISON:  Earlier today. FINDINGS: Interval endotracheal tube in satisfactory position. Stable enlarged cardiac silhouette. Increased diffuse bilateral airspace opacity, more confluent on the right. No visible pleural fluid. Thoracic spine degenerative changes. IMPRESSION: 1. Endotracheal tube in satisfactory position. 2. Increased extensive bilateral pneumonia or alveolar edema. Electronically Signed   By: Claudie Revering M.D.   On: 05/17/2021 16:03   DG Chest Portable 1 View  Result  Date: 06/02/2021 CLINICAL DATA:  Shortness of breath and productive cough. EXAM: PORTABLE CHEST 1 VIEW COMPARISON:  Chest x-ray dated May 07, 2021. FINDINGS: Unchanged mild cardiomegaly. Worsened diffuse interstitial and hazy airspace opacities in both lungs. No pneumothorax or large pleural effusion. No acute osseous abnormality. IMPRESSION: Worsened diffuse interstitial and hazy airspace opacities in both lungs concerning for pulmonary edema or multifocal pneumonia superimposed on chronic interstitial lung disease for. Electronically Signed   By: Titus Dubin M.D.   On: 05/16/2021 12:48   DG Abd Portable 1V  Result Date: 05/29/2021 CLINICAL DATA:  Evaluate NG tube placement EXAM: PORTABLE ABDOMEN - 1 VIEW COMPARISON:  None. FINDINGS: The NG  tube terminates in the left upper quadrant with the side port and distal tip in the region of the stomach. IMPRESSION: NG tube placement as above. The side port and distal tip are in the region of the stomach, below the GE junction. Electronically Signed   By: Dorise Bullion III M.D   On: 05/29/2021 10:42   ECHOCARDIOGRAM LIMITED  Result Date: 05/13/2021    ECHOCARDIOGRAM LIMITED REPORT   Patient Name:   KAYA POTTENGER Date of Exam: 05/13/2021 Medical Rec #:  [id number]           Height:       61.0 in Accession #:    [id number]          Weight:       102.0 lb Date of Birth:  21-May-1941           BSA:          1.419 m Patient Age:    18 years            BP:           84/58 mmHg Patient Gender: F                   HR:           108 bpm. Exam Location:  [facility] Procedure: Limited Echo, Color Doppler, Cardiac Doppler and Intracardiac            Opacification Agent Indications:     Elevated troponin  History:         Patient has prior history of Echocardiogram examinations, most                  recent 05/05/2021. Risk Factors:Hypertension and Diabetes.  Sonographer:     Sherrie Sport RDCS (AE) Referring Phys:  [id number] Rise Mu Diagnosing Phys: Nelva Bush MD   Sonographer Comments: Suboptimal apical window. IMPRESSIONS  1. Left ventricular ejection fraction, by estimation, is 30 to 35%. The left ventricle has moderately decreased function. The left ventricle demonstrates regional wall motion abnormalities (see scoring diagram/findings for description). There is mild left ventricular hypertrophy. There is severe hypokinesis of the left ventricular, mid-apical anterior wall, anterolateral wall, anteroseptal wall, apical segment and inferior wall. Findings are most consistent with large LAD territory artifact, though stress-induced cardiomyopathy could also have this appearance.  2. Left atrial size was mildly dilated.  3. The mitral valve is normal in structure. Mild mitral valve regurgitation.  4. Tricuspid valve regurgitation is mild to moderate.  5. The aortic valve is tricuspid. There is mild thickening of the aortic valve. Comparison(s): LVEF has decreased significantly since 05/05/2021. FINDINGS  Left Ventricle: No evidence apical thrombus. Left ventricular ejection fraction, by estimation, is 30 to 35%. The left ventricle has moderately decreased function. The left ventricle demonstrates regional wall motion abnormalities. Severe hypokinesis of  the left ventricular, mid-apical anterior wall, anterolateral wall, anteroseptal wall, apical segment and inferior wall. Definity contrast agent was given IV to delineate the left ventricular endocardial borders. The left ventricular internal cavity size was normal in size. There is mild left ventricular hypertrophy. Right Ventricle: No increase in right ventricular wall thickness. Left Atrium: Left atrial size was mildly dilated. Right Atrium: Right atrial size was normal in size. Mitral Valve: The mitral valve is normal in structure. Mild mitral valve regurgitation. Tricuspid Valve: The tricuspid valve is normal in structure. Tricuspid valve regurgitation is mild to moderate. Aortic Valve: The aortic valve is tricuspid. There  is mild thickening of the aortic valve. Pulmonic Valve: The pulmonic valve was grossly normal. Pulmonic valve regurgitation is not visualized. No evidence of pulmonic stenosis. Pulmonary Artery: The pulmonary artery is of normal size. LEFT VENTRICLE PLAX 2D LVIDd:         4.86 cm LVIDs:         3.80 cm LV PW:         0.96 cm LV IVS:        1.29 cm LVOT diam:     2.10 cm LVOT Area:     3.46 cm  LEFT ATRIUM           Index       RIGHT ATRIUM          Index LA diam:      4.80 cm 3.38 cm/m  RA Area:     9.75 cm LA Vol (A4C): 59.2 ml 41.73 ml/m RA Volume:   18.10 ml 12.76 ml/m                        PULMONIC VALVE AORTA                 PV Vmax:        0.65 m/s Ao Root diam: 2.90 cm PV Peak grad:   1.7 mmHg                       RVOT Peak grad: 3 mmHg  TRICUSPID VALVE TR Peak grad:   59.9 mmHg TR Vmax:        387.00 cm/s  SHUNTS Systemic Diam: 2.10 cm Nelva Bush MD Electronically signed by Nelva Bush MD Signature Date/Time: 05/30/2021/5:45:41 PM    Final     Cardiac Studies   Echo 05/27/2021  1. Left ventricular ejection fraction, by estimation, is 30 to 35%. The  left ventricle has moderately decreased function. The left ventricle  demonstrates regional wall motion abnormalities (see scoring  diagram/findings for description). There is mild  left ventricular hypertrophy. There is severe hypokinesis of the left  ventricular, mid-apical anterior wall, anterolateral wall, anteroseptal  wall, apical segment and inferior wall. Findings are most consistent with  large LAD territory artifact, though  stress-induced cardiomyopathy could also have this appearance.   2. Left atrial size was mildly dilated.   3. The mitral valve is normal in structure. Mild mitral valve  regurgitation.   4. Tricuspid valve regurgitation is mild to moderate.   5. The aortic valve is tricuspid. There is mild thickening of the aortic  valve.   Patient Profile     80 y.o. female with history of ILD, diabetes,  hypertension, hyperlipidemia presenting with chest pain, shortness of breath diagnosed with NSTEMI, new reduced ejection fraction with EF 30 to 35%, cardiogenic shock requiring intubation and 3 pressors.  Assessment & Plan    1.  CHF, EF 30-35% -Low pressures preventing use of CHF meds at this point -On 3 pressors for support -Continue supportive care  2.  NSTEMI -Troponins peaked at [phone] -Plan left heart cath when respiratory/clinical status improves, renal function worsened compared to yesterday  3.  ILD, respiratory failure -Intubated for support -Vent management as per critical care team  Overall prognosis remains guarded.  Continue supportive care as above.  Critical care time was exclusive of separate billable procedures and treating other patients.  Critial care time was spent personally by me (independant of midlevel providers) on the  following activities: development of treatment plan,  evaluation of patients response to treatment, examining patient, reviewing treatments/ interventions, lab studies, radiographic studies, pulse ox, and re-evaluation of patients condition.      The patient is critically ill with multiple organ systems failure and requires high complexity decision making for assessment and support, frequent evaluation and titration of therapies..   Critical care was necessary to treat or prevent immintent or life-threatening deterioration.   Total CCT spent directly with the patient today is 26mnutes     Signed, BKate Sable MD  05/29/2021, 3:24 PM

## 2021-05-29 NOTE — Progress Notes (Signed)
Name: Barbara [name] MRN: [id number] DOB: Dec 24, 1940     CONSULTATION DATE: 05/31/2021  REFERRING MD :  Jari Pigg  SUBJECTIVE:  CHIEF COMPLAINT:  Chest pain  HISTORY OF PRESENT ILLNESS:  80 yo WF never smoker recently admitted for hypoxic respiratory failure in May, where she was found to have stigmata of interstitial lung disease and concurrent pulmonary edema, now presents with complaints of chest pain There was a concurrent cough productive of some purulent sputum over the past several days. CXR revealed bilateral interstitial edema. A troponin returned in excess of 16k, and cards saw her in the ED with a prelim echo reporting new reduction in EF 30-35%. She failed NIV  for accruing oxygen need, and right before intubation developed some hemoptysis (heparin was initiated for ACS but stopped). With intubation she required a levophed drip.    Allergies  Allergen Reactions  . Atorvastatin     REACTION: increased liver fxn tests  . Ezetimibe     REACTION: stomach upset/malaise  . Sulfonamide Derivatives     REACTION: rash   OBJECTIVE: Estimated body mass index is 24.54 kg/m as calculated from the following:   Height as of this encounter: 5\' 1"  (1.549 m).   Weight as of this encounter: 58.9 kg.    VITAL SIGNS: Temp:  [97 F (36.1 C)-100.4 F (38 C)] 99.68 F (37.6 C) (06/25 1430) Pulse Rate:  [112-142] 130 (06/25 1430) Resp:  [17-36] 26 (06/25 1430) BP: (67-108)/(52-79) 94/68 (06/25 1430) SpO2:  [52 %-99 %] 97 % (06/25 1430) FiO2 (%):  [80 %-100 %] 80 % (06/25 1430) Weight:  [57.7 kg-58.9 kg] 58.9 kg (06/25 0413)   I/O last 3 completed shifts: In: 2100 [I.V.:951.6; IV Piggyback:1148.3] Out: 250 [Urine:250] Total I/O In: 995.5 [I.V.:691.5; IV Piggyback:304] Out: 150 [Urine:150]   SpO2: 97 % O2 Flow Rate (L/min): 15 L/min FiO2 (%): 80 %   Physical Examination:  GENERAL:critically ill appearing, thin HEAD: Normocephalic, atraumatic.  EYES: Pupils equal,  round, reactive to light.  No scleral icterus.  MOUTH: Moist mucosal membrane. NECK: Supple.  JVD noted.  PULMONARY: ETT/MV +rhonchi, frank blood in ETT CARDIOVASCULAR: tachy S1 and S2. Regular rate and rhythm. No murmurs, rubs, or gallops.  GASTROINTESTINAL: Soft, nontender, -distended.  diminished bowel sounds.  MUSCULOSKELETAL: No swelling, clubbing, or edema, petechiae noted on tibial plateaus to knees NEUROLOGIC: obtunded SKIN:intact,warm,dry  I personally reviewed lab work that was obtained in last 24 hrs. CXR Independently reviewed, bilateral interstitial edema  MEDICATIONS: I have reviewed all medications and confirmed regimen as documented   CULTURE RESULTS   Recent Results (from the past 240 hour(s))  Blood culture (routine x 2)     Status: None (Preliminary result)   Collection Time: 05/28/2021 12:11 PM   Specimen: BLOOD  Result Value Ref Range Status   Specimen Description BLOOD LEFT ANTECUBITAL  Final   Special Requests   Final    BOTTLES DRAWN AEROBIC AND ANAEROBIC Blood Culture adequate volume   Culture   Final    NO GROWTH < 24 HOURS Performed at [facility], [address], [place], [place] [address]    Report Status PENDING  Incomplete  Resp Panel by RT-PCR (Flu A&B, Covid) Nasopharyngeal Swab     Status: None   Collection Time: 05/11/2021 12:12 PM   Specimen: Nasopharyngeal Swab; Nasopharyngeal(NP) swabs in vial transport medium  Result Value Ref Range Status   SARS Coronavirus 2 by RT PCR NEGATIVE NEGATIVE Final    Comment: (  NOTE) SARS-CoV-2 target nucleic acids are NOT DETECTED.  The SARS-CoV-2 RNA is generally detectable in upper respiratory specimens during the acute phase of infection. The lowest concentration of SARS-CoV-2 viral copies this assay can detect is 138 copies/mL. A negative result does not preclude SARS-Cov-2 infection and should not be used as the sole basis for treatment or other patient management decisions. A negative result  may occur with  improper specimen collection/handling, submission of specimen other than nasopharyngeal swab, presence of viral mutation(s) within the areas targeted by this assay, and inadequate number of viral copies(<138 copies/mL). A negative result must be combined with clinical observations, patient history, and epidemiological information. The expected result is Negative.  Fact Sheet for Patients:  EntrepreneurPulse.com.au  Fact Sheet for Healthcare Providers:  IncredibleEmployment.be  This test is no t yet approved or cleared by the [place] FDA and  has been authorized for detection and/or diagnosis of SARS-CoV-2 by FDA under an Emergency Use Authorization (EUA). This EUA will remain  in effect (meaning this test can be used) for the duration of the COVID-19 declaration under Section 564(b)(1) of the Act, 21 U.S.C.section 360bbb-3(b)(1), unless the authorization is terminated  or revoked sooner.       Influenza A by PCR NEGATIVE NEGATIVE Final   Influenza B by PCR NEGATIVE NEGATIVE Final    Comment: (NOTE) The Xpert Xpress SARS-CoV-2/FLU/RSV plus assay is intended as an aid in the diagnosis of influenza from Nasopharyngeal swab specimens and should not be used as a sole basis for treatment. Nasal washings and aspirates are unacceptable for Xpert Xpress SARS-CoV-2/FLU/RSV testing.  Fact Sheet for Patients: EntrepreneurPulse.com.au  Fact Sheet for Healthcare Providers: IncredibleEmployment.be  This test is not yet approved or cleared by the [place] FDA and has been authorized for detection and/or diagnosis of SARS-CoV-2 by FDA under an Emergency Use Authorization (EUA). This EUA will remain in effect (meaning this test can be used) for the duration of the COVID-19 declaration under Section 564(b)(1) of the Act, 21 U.S.C. section 360bbb-3(b)(1), unless the authorization is terminated  or revoked.  Performed at [facility], [address] [place], [place] [address]   Blood culture (routine x 2)     Status: None (Preliminary result)   Collection Time: 05/05/2021  1:50 PM   Specimen: BLOOD  Result Value Ref Range Status   Specimen Description BLOOD RIGHT ANTECUBITAL  Final   Special Requests   Final    BOTTLES DRAWN AEROBIC AND ANAEROBIC Blood Culture results may not be optimal due to an inadequate volume of blood received in culture bottles   Culture  Setup Time   Final    Organism ID to follow GRAM NEGATIVE RODS IN BOTH AEROBIC AND ANAEROBIC BOTTLES CRITICAL RESULT CALLED TO, READ BACK BY AND VERIFIED WITH: JASON ROBINS @ 0218 05/29/21 LFD Performed at [facility], [address], [place], [place] [address]    Culture GRAM NEGATIVE RODS  Final   Report Status PENDING  Incomplete  Blood Culture ID Panel (Reflexed)     Status: Abnormal   Collection Time: 05/26/2021  1:50 PM  Result Value Ref Range Status   Enterococcus faecalis NOT DETECTED NOT DETECTED Final   Enterococcus Faecium NOT DETECTED NOT DETECTED Final   Listeria monocytogenes NOT DETECTED NOT DETECTED Final   Staphylococcus species NOT DETECTED NOT DETECTED Final   Staphylococcus aureus (BCID) NOT DETECTED NOT DETECTED Final   Staphylococcus epidermidis NOT DETECTED NOT DETECTED Final   Staphylococcus lugdunensis NOT DETECTED  NOT DETECTED Final   Streptococcus species NOT DETECTED NOT DETECTED Final   Streptococcus agalactiae NOT DETECTED NOT DETECTED Final   Streptococcus pneumoniae NOT DETECTED NOT DETECTED Final   Streptococcus pyogenes NOT DETECTED NOT DETECTED Final   A.calcoaceticus-baumannii NOT DETECTED NOT DETECTED Final   Bacteroides fragilis NOT DETECTED NOT DETECTED Final   Enterobacterales DETECTED (A) NOT DETECTED Final    Comment: Enterobacterales represent a large order of gram negative bacteria, not a single organism. CRITICAL RESULT CALLED TO, READ BACK BY AND  VERIFIED WITH: JASON ROBINS @ 3149 05/29/21 LFD    Enterobacter cloacae complex NOT DETECTED NOT DETECTED Final   Escherichia coli DETECTED (A) NOT DETECTED Final    Comment: CRITICAL RESULT CALLED TO, READ BACK BY AND VERIFIED WITH: JASON ROBINS @ 7026 05/29/21 LFD    Klebsiella aerogenes NOT DETECTED NOT DETECTED Final   Klebsiella oxytoca NOT DETECTED NOT DETECTED Final   Klebsiella pneumoniae NOT DETECTED NOT DETECTED Final   Proteus species NOT DETECTED NOT DETECTED Final   Salmonella species NOT DETECTED NOT DETECTED Final   Serratia marcescens NOT DETECTED NOT DETECTED Final   Haemophilus influenzae NOT DETECTED NOT DETECTED Final   Neisseria meningitidis NOT DETECTED NOT DETECTED Final   Pseudomonas aeruginosa NOT DETECTED NOT DETECTED Final   Stenotrophomonas maltophilia NOT DETECTED NOT DETECTED Final   Candida albicans NOT DETECTED NOT DETECTED Final   Candida auris NOT DETECTED NOT DETECTED Final   Candida glabrata NOT DETECTED NOT DETECTED Final   Candida krusei NOT DETECTED NOT DETECTED Final   Candida parapsilosis NOT DETECTED NOT DETECTED Final   Candida tropicalis NOT DETECTED NOT DETECTED Final   Cryptococcus neoformans/gattii NOT DETECTED NOT DETECTED Final   CTX-M ESBL NOT DETECTED NOT DETECTED Final   Carbapenem resistance IMP NOT DETECTED NOT DETECTED Final   Carbapenem resistance KPC NOT DETECTED NOT DETECTED Final   Carbapenem resistance NDM NOT DETECTED NOT DETECTED Final   Carbapenem resist OXA 48 LIKE NOT DETECTED NOT DETECTED Final   Carbapenem resistance VIM NOT DETECTED NOT DETECTED Final    Comment: Performed at [facility], [address] [place], [place] [address]  Culture, Respiratory w Gram Stain     Status: None (Preliminary result)   Collection Time: 05/31/2021  4:47 PM   Specimen: INDUCED SPUTUM  Result Value Ref Range Status   Specimen Description   Final    INDUCED SPUTUM Performed at [facility], [address]  [place], [place] [address]    Special Requests   Final    NONE Performed at [facility], [address] [place], [place] [address]    Gram Stain PENDING  Incomplete   Culture   Final    NO GROWTH < 24 HOURS Performed at [facility], [place] [address], [place], [place] [address]    Report Status PENDING  Incomplete  MRSA Next Gen by PCR, Nasal     Status: None   Collection Time: 05/27/2021  6:43 PM   Specimen: Nasal Mucosa; Nasal Swab  Result Value Ref Range Status   MRSA by PCR Next Gen NOT DETECTED NOT DETECTED Final    Comment: (NOTE) The GeneXpert MRSA Assay (FDA approved for NASAL specimens only), is one component of a comprehensive MRSA colonization surveillance program. It is not intended to diagnose MRSA infection nor to guide or monitor treatment for MRSA infections. Test performance is not FDA approved in patients less than 65 years old. Performed at [facility],  [place], [place] [address]           IMAGING    DG Chest 1 View  Result Date: 05/29/2021 CLINICAL DATA:  Pneumothorax EXAM: CHEST  1 VIEW COMPARISON:  05/19/2021 FINDINGS: Endotracheal tube seen 4.8 cm above the carina. Nasogastric tube extends into the upper abdomen beyond the margin of the examination. Left internal jugular central venous catheter tip noted within the superior vena cava. The lungs are symmetrically well expanded, stable since prior examination. Extensive, diffuse airspace infiltrate is again seen throughout the lungs, stable since prior examination, in keeping with diffuse pulmonary hemorrhage, pulmonary edema, or inflammatory infiltrate. No pneumothorax or pleural effusion. Cardiac size within normal limits. No acute bone abnormality. IMPRESSION: Support lines and tubes in appropriate position. No pneumothorax. Stable diffuse, extensive airspace infiltrate. Electronically Signed   By: Fidela Salisbury MD   On: 05/29/2021 00:56   DG Chest Port 1 View  Result  Date: 05/16/2021 CLINICAL DATA:  Encounter for central line placement. EXAM: PORTABLE CHEST 1 VIEW COMPARISON:  May 28, 2021. FINDINGS: LEFT IJ central line placed since the previous study terminates in the area of the caval to atrial junction. Endotracheal tube terminates in the mid trachea approximately 3.9 cm from the carina. EKG leads project over the chest. Gastric tube courses through in off the field of the radiograph. Pacer defibrillator pads project over the LEFT chest and upper abdomen. Stable appearance of the chest with diffuse interstitial and airspace opacities. Cardiomediastinal contours remain obscured. On limited assessment no acute skeletal process. IMPRESSION: 1. LEFT IJ central line placed since the previous study, terminating in the area of the caval to atrial junction. 2. Otherwise no change in the appearance of the chest with diffuse interstitial and airspace opacities in this intubated patient. 3. Interval placement of a gastric tube, tip off the field of view. Electronically Signed   By: Zetta Bills M.D.   On: 05/08/2021 16:48   DG Chest Portable 1 View  Result Date: 05/11/2021 CLINICAL DATA:  Status post intubation. EXAM: PORTABLE CHEST 1 VIEW COMPARISON:  Earlier today. FINDINGS: Interval endotracheal tube in satisfactory position. Stable enlarged cardiac silhouette. Increased diffuse bilateral airspace opacity, more confluent on the right. No visible pleural fluid. Thoracic spine degenerative changes. IMPRESSION: 1. Endotracheal tube in satisfactory position. 2. Increased extensive bilateral pneumonia or alveolar edema. Electronically Signed   By: Claudie Revering M.D.   On: 05/18/2021 16:03   DG Abd Portable 1V  Result Date: 05/29/2021 CLINICAL DATA:  Evaluate NG tube placement EXAM: PORTABLE ABDOMEN - 1 VIEW COMPARISON:  None. FINDINGS: The NG tube terminates in the left upper quadrant with the side port and distal tip in the region of the stomach. IMPRESSION: NG tube placement  as above. The side port and distal tip are in the region of the stomach, below the GE junction. Electronically Signed   By: Dorise Bullion III M.D   On: 05/29/2021 10:42     Nutrition Status:         Indwelling Urinary Catheter continued, requirement due to   Reason to continue Indwelling Urinary Catheter strict Intake/Output monitoring for hemodynamic instability   Central Line/ continued, requirement due to  Reason to continue [place] of central venous pressure or other hemodynamic parameters and poor IV access   Ventilator continued, requirement due to severe respiratory failure   Ventilator Sedation RASS 0 to -2      ASSESSMENT AND PLAN SYNOPSIS  RESPIRATORY Severe ACUTE  Hypoxic and Hypercapnic Respiratory Failure: ILD, acute exacerbation? ARDS? vs Cardiogenic edema?  Hemoptysis/bronchiectasis   -continue Full MV support -continue Bronchodilator Therapy -Wean Fio2 and PEEP as tolerated -Peak pressures 50s upon arrival in the ED -maintain VT 320 rate  24 -maintain inverse I:E ratio 1.5:1 with PEEP deceased to 8, peak around 35, plat 33-4 -bronch suggestive of RML as region for hemorrhage, but airway both L/R spackled with blood -VAP/VENT bundle implementation -Very little or no improvement overnight with several hypoxic events  CARDIAC/ACUTE SYSTOLIC CARDIAC FAILURE: ECHO 05/06/21  1. Left ventricular ejection fraction, by estimation, is 55 to 60%. The  left ventricle has normal function. The left ventricle has no regional  wall motion abnormalities. Left ventricular diastolic parameters were  normal.   2. Right ventricular systolic function is normal. The right ventricular  size is normal.   3. The mitral valve is normal in structure. No evidence of mitral valve  regurgitation.   4. The aortic valve is grossly normal. Aortic valve regurgitation is not  visualized.  ECHO 6/24  1. Left ventricular ejection fraction, by estimation, is 30 to 35%.  The  left ventricle has moderately decreased function. The left ventricle  demonstrates regional wall motion abnormalities (see scoring  diagram/findings for description). There is mild  left ventricular hypertrophy. There is severe hypokinesis of the left  ventricular, mid-apical anterior wall, anterolateral wall, anteroseptal  wall, apical segment and inferior wall. Findings are most consistent with  large LAD territory infarct, stress-induced cardiomyopathy could have this appearance.   2. Left atrial size was mildly dilated.   3. The mitral valve is normal in structure. Mild mitral valve  regurgitation.   4. Tricuspid valve regurgitation is mild to moderate.   5. The aortic valve is tricuspid. There is mild thickening of the aortic  valve.    -ETT/MV -presenting troponin >16k, nadir 8.8k but back to nearly 13k, trend -No STEMI on EKG, but suggestive of ischemia  -Repeat shows big drop in EF s/o LAD ischemia -NSTEMI, unable to anticoagulate with pulmonary hemorrhage  -Lasix as hemodynamics and renal indices tolerate -pressor for MAP 60-65, if tachy swap levo to neo -follow up cardiology recs -Lactic <2, trend  ACUTE KIDNEY INJURY/Renal Failure Lab Results  Component Value Date   CREATININE 1.91 (H) 05/29/2021   BUN 48 (H) 05/29/2021   NA 136 05/29/2021   K 3.6 05/29/2021   CL 99 05/29/2021   CO2 26 05/29/2021   HypoNa, resolved AKI Mild AGMA, lactic thus far <2 -continue Foley Catheter-assess need -Avoid nephrotoxic agents -Follow urine output, BMP -Ensure adequate renal perfusion, optimize oxygenation -Renal dose medications  ID -cefepime and vanc up front -continue IV abx as prescibed -follow up cultures   NEUROLOGY - intubated and sedated - minimal sedation to achieve a RASS goal: -1 Wake up assessment pending  Acute toxic metabolic encephalopathy, need for sedation Goal RASS -2 to -3   GI GI PROPHYLAXIS as indicated  NUTRITIONAL STATUS DIET-->TF's  as tolerated Constipation protocol as indicated   ENDO - will use ICU hypoglycemic\Hyperglycemia protocol if needed    ELECTROLYTES -follow labs as needed -replace as needed -pharmacy consultation and following    DVT/GI PRX ordered and assessed TRANSFUSIONS AS NEEDED MONITOR FSBS I Assessed the need for Labs I Assessed the need for Foley I Assessed the need for Central Venous Line Family Discussion when available I Assessed the need for Mobilization I made an Assessment of medications to be adjusted accordingly Safety Risk assessment  Completed  CASE DISCUSSED IN MULTIDISCIPLINARY ROUNDS WITH ICU TEAM   Critical Care Time devoted to patient care services described in this note is 45 minutes.   Overall, patient is critically ill, prognosis is guarded.  Patient with Multiorgan failure and at high risk for cardiac arrest and death.

## 2021-05-29 NOTE — Progress Notes (Signed)
PHARMACY - PHYSICIAN COMMUNICATION CRITICAL VALUE ALERT - BLOOD CULTURE IDENTIFICATION (BCID)  Barbara [name] is an 80 y.o. female who presented to [facility] on 05/05/2021 with a chief complaint of resp failure.   Assessment:  E Coli in 2 of 4 bottles, no resistance.  (include suspected source if known)  Name of physician (or Provider) Contacted: Jonny Ruiz, NP   Current antibiotics: Vanc, Cefepime   Changes to prescribed antibiotics recommended:  Recommendations accepted by provider  Will d/c vanc and cefepime and start on ceftriaxone 2 gm IV Q24H.   Results for orders placed or performed during the hospital encounter of 05/25/2021  Blood Culture ID Panel (Reflexed) (Collected: 05/06/2021  1:50 PM)  Result Value Ref Range   Enterococcus faecalis NOT DETECTED NOT DETECTED   Enterococcus Faecium NOT DETECTED NOT DETECTED   Listeria monocytogenes NOT DETECTED NOT DETECTED   Staphylococcus species NOT DETECTED NOT DETECTED   Staphylococcus aureus (BCID) NOT DETECTED NOT DETECTED   Staphylococcus epidermidis NOT DETECTED NOT DETECTED   Staphylococcus lugdunensis NOT DETECTED NOT DETECTED   Streptococcus species NOT DETECTED NOT DETECTED   Streptococcus agalactiae NOT DETECTED NOT DETECTED   Streptococcus pneumoniae NOT DETECTED NOT DETECTED   Streptococcus pyogenes NOT DETECTED NOT DETECTED   A.calcoaceticus-baumannii NOT DETECTED NOT DETECTED   Bacteroides fragilis NOT DETECTED NOT DETECTED   Enterobacterales DETECTED (A) NOT DETECTED   Enterobacter cloacae complex NOT DETECTED NOT DETECTED   Escherichia coli DETECTED (A) NOT DETECTED   Klebsiella aerogenes NOT DETECTED NOT DETECTED   Klebsiella oxytoca NOT DETECTED NOT DETECTED   Klebsiella pneumoniae NOT DETECTED NOT DETECTED   Proteus species NOT DETECTED NOT DETECTED   Salmonella species NOT DETECTED NOT DETECTED   Serratia marcescens NOT DETECTED NOT DETECTED   Haemophilus influenzae NOT DETECTED NOT DETECTED   Neisseria  meningitidis NOT DETECTED NOT DETECTED   Pseudomonas aeruginosa NOT DETECTED NOT DETECTED   Stenotrophomonas maltophilia NOT DETECTED NOT DETECTED   Candida albicans NOT DETECTED NOT DETECTED   Candida auris NOT DETECTED NOT DETECTED   Candida glabrata NOT DETECTED NOT DETECTED   Candida krusei NOT DETECTED NOT DETECTED   Candida parapsilosis NOT DETECTED NOT DETECTED   Candida tropicalis NOT DETECTED NOT DETECTED   Cryptococcus neoformans/gattii NOT DETECTED NOT DETECTED   CTX-M ESBL NOT DETECTED NOT DETECTED   Carbapenem resistance IMP NOT DETECTED NOT DETECTED   Carbapenem resistance KPC NOT DETECTED NOT DETECTED   Carbapenem resistance NDM NOT DETECTED NOT DETECTED   Carbapenem resist OXA 48 LIKE NOT DETECTED NOT DETECTED   Carbapenem resistance VIM NOT DETECTED NOT DETECTED    Barbara [name] D 05/29/2021  2:27 AM

## 2021-05-29 NOTE — Consult Note (Signed)
PHARMACY CONSULT NOTE - FOLLOW UP  Pharmacy Consult for Electrolyte Monitoring and Replacement   Recent Labs: Potassium (mmol/L)  Date Value  05/29/2021 3.1 (L)   Magnesium (mg/dL)  Date Value  05/29/2021 1.9   Calcium (mg/dL)  Date Value  05/29/2021 7.7 (L)   Albumin (g/dL)  Date Value  05/08/2021 3.3 (L)   Phosphorus (mg/dL)  Date Value  05/29/2021 7.9 (H)   Sodium (mmol/L)  Date Value  05/29/2021 136     Assessment: Pharmacy has been consulted to monitor and replace electrolytes in 80yo patient who presented to the ED with chest pain. Patient required intubation for acute respiratory failure. She is now requiring vasopressors for blood pressure support.  Goal of Therapy:  Electrolytes WNL  Plan:  Potassium 10 mEq IV x 6 runs ordered by CCM Follow up electrolytes with morning labs  Tawnya Crook ,PharmD Clinical Pharmacist 05/29/2021 7:38 AM

## 2021-05-29 NOTE — Progress Notes (Addendum)
6/25 @0030 : Sats dropped to the 50s on 90%FI02 with BP in the low 29'J systolic. Patient with unequal breath sounds. Attempted manual bag ventillation with no improvement in sats. Vecuronium administered and chest xray obtained STAT. Discussed events with on call intensivist Dr, Merrilee Jansky who agreed with current plan and recommendations to consider Nimbex if no improvement and restart Levo. Patient's POA Alma notified of change in status and possible transition to comfort care if no improvement with current intervention. Following administration of Vecuronium and patient repositioning, patient's sats improved drastically from low 50's to upper 90's on FIO2 100%. Updated sister and intensivist of the outcome. Will continue to monitor.  6/24 @2100 :Repeat Arterial Blood Gas result @ 2100 revealed:  pO2 123; pCO2 69; pH 7.25;  HCO3 30.3, %O2 Sat 98.2. post 3 amps of sodium bicarb/4 mg of versed/75 mcg of fentanyl and Vecuronium. Previous Ventilator settings: 100 FiO2, 8 PEEP, TV 320, Rate 26 with peak pressure range 36-38.  6/24 @1930 : Repeat ABG revealed pH 7.11/pCO2 68/pO2 88/acid-base deficit 8.2/bicarb 21.6.  Attempted to adjust ventilator settings, however peak pressures increased pt placed back on previous settings TV 320 and Rate 26.  Orders placed to administer 3 amps of sodium bicarb/4 mg of versed/75 mcg of fentanyl.  Repeat ABG pending for 2100.  Will continue to monitor and assess pt.  PLAN -Will continue current lung protective vent settings and allow for permissive hypercapnia with target pH of 7.25 or greater for acid base balance. -Obtain STAT chest xray -Follow intermittent ABG and chest x-ray as needed -PRN Vecuronium, Fentanyl and Versed for sedation       Rufina Falco, DNP, CCRN, FNP-C, AGACNP-BC Acute Care Nurse Practitioner  [place] [facility] Pager: [phone] [place] [facility]

## 2021-05-29 NOTE — Plan of Care (Signed)
Pt continued on full vent support all day, unresponsive to stimulation, no gag reflexes with suction. Non purposeful movement to extremities. Pt continue on levo, neo, vaso and propofol gtt. BP has labile all day, MD aware. MD updated family at bedside today.     Problem: Clinical Measurements: Goal: Ability to maintain clinical measurements within normal limits will improve Outcome: Not Progressing Goal: Will remain free from infection Outcome: Not Progressing Goal: Diagnostic test results will improve Outcome: Not Progressing Goal: Respiratory complications will improve Outcome: Not Progressing Goal: Cardiovascular complication will be avoided Outcome: Progressing

## 2021-05-30 DIAGNOSIS — Z452 Encounter for adjustment and management of vascular access device: Secondary | ICD-10-CM

## 2021-05-30 DIAGNOSIS — Z4659 Encounter for fitting and adjustment of other gastrointestinal appliance and device: Secondary | ICD-10-CM

## 2021-05-30 LAB — BLOOD GAS, ARTERIAL
Acid-base deficit: 0.3 mmol/L (ref 0.0–2.0)
Bicarbonate: 27.4 mmol/L (ref 20.0–28.0)
FIO2: 0.8
MECHVT: 320 mL
O2 Saturation: 94.4 %
PEEP: 5 cmH2O
Patient temperature: 37
RATE: 26 resp/min
pCO2 arterial: 61 mmHg — ABNORMAL HIGH (ref 32.0–48.0)
pH, Arterial: 7.26 — ABNORMAL LOW (ref 7.350–7.450)
pO2, Arterial: 83 mmHg (ref 83.0–108.0)

## 2021-05-30 LAB — GLUCOSE, CAPILLARY
Glucose-Capillary: 198 mg/dL — ABNORMAL HIGH (ref 70–99)
Glucose-Capillary: 209 mg/dL — ABNORMAL HIGH (ref 70–99)
Glucose-Capillary: 235 mg/dL — ABNORMAL HIGH (ref 70–99)
Glucose-Capillary: 207 mg/dL — ABNORMAL HIGH (ref 70–99)
Glucose-Capillary: 187 mg/dL — ABNORMAL HIGH (ref 70–99)
Glucose-Capillary: 167 mg/dL — ABNORMAL HIGH (ref 70–99)

## 2021-05-30 LAB — BASIC METABOLIC PANEL
Anion gap: 13 (ref 5–15)
BUN: 66 mg/dL — ABNORMAL HIGH (ref 8–23)
CO2: 26 mmol/L (ref 22–32)
Calcium: 7.5 mg/dL — ABNORMAL LOW (ref 8.9–10.3)
Chloride: 105 mmol/L (ref 98–111)
Creatinine, Ser: 2.01 mg/dL — ABNORMAL HIGH (ref 0.44–1.00)
GFR, Estimated: 25 mL/min — ABNORMAL LOW (ref >60)
Glucose, Bld: 217 mg/dL — ABNORMAL HIGH (ref 70–99)
Potassium: 3.6 mmol/L (ref 3.5–5.1)
Sodium: 144 mmol/L (ref 135–145)

## 2021-05-30 LAB — CBC WITH DIFFERENTIAL/PLATELET
Abs Immature Granulocytes: 0.29 10*3/uL — ABNORMAL HIGH (ref 0.00–0.07)
Basophils Absolute: 0 10*3/uL (ref 0.0–0.1)
Basophils Relative: 0 %
Eosinophils Absolute: 0 10*3/uL (ref 0.0–0.5)
Eosinophils Relative: 0 %
HCT: 24.5 % — ABNORMAL LOW (ref 36.0–46.0)
Hemoglobin: 8.1 g/dL — ABNORMAL LOW (ref 12.0–15.0)
Immature Granulocytes: 2 %
Lymphocytes Relative: 3 %
Lymphs Abs: 0.6 10*3/uL — ABNORMAL LOW (ref 0.7–4.0)
MCH: 28.3 pg (ref 26.0–34.0)
MCHC: 33.1 g/dL (ref 30.0–36.0)
MCV: 85.7 fL (ref 80.0–100.0)
Monocytes Absolute: 1.8 10*3/uL — ABNORMAL HIGH (ref 0.1–1.0)
Monocytes Relative: 10 %
Neutro Abs: 16.3 10*3/uL — ABNORMAL HIGH (ref 1.7–7.7)
Neutrophils Relative %: 85 %
Platelets: 370 10*3/uL (ref 150–400)
RBC: 2.86 MIL/uL — ABNORMAL LOW (ref 3.87–5.11)
RDW: 14.7 % (ref 11.5–15.5)
WBC: 19 10*3/uL — ABNORMAL HIGH (ref 4.0–10.5)
nRBC: 0.1 % (ref 0.0–0.2)

## 2021-05-30 LAB — PROCALCITONIN: Procalcitonin: 6.21 ng/mL

## 2021-05-30 MED FILL — Medication: Qty: 1 | Status: AC

## 2021-05-30 NOTE — Progress Notes (Deleted)
During turn patient respirations appeared agonal, PO2 sat 79%, only lasted about 20 seconds. PO2 sat increased back up, respiration labored up to 40's. HR up to 130's, and diaphoretic. Call to RT and [facility] NP.

## 2021-05-30 NOTE — Progress Notes (Signed)
GOALS OF CARE DISCUSSION  The Clinical status was relayed to family in detail. Sisters at bedside  Updated and notified of patients medical condition.    Patient remains unresponsive and will not open eyes to command.   Patient is having a weak cough and struggling to remove secretions.   Patient with increased WOB and using accessory muscles to breathe Explained to family course of therapy and the modalities  Severe Hypoxic resp failure Severe shock and severe systolic heart failure Continue vent support Pressors as needed  I have discussed possible need for HD and at this time, the family has agreed NOT to pursue hemodialysis. Will implement end of life visitation policy, will may consider comfort care measures by end of this week if no significant improvement   Patient with Progressive multiorgan failure with a very high probablity of a very minimal chance of meaningful recovery despite all aggressive and optimal medical therapy.  PATIENT REMAINS DNR STATUS  Family understands the situation.    Family are satisfied with Plan of action and management. All questions answered  Additional CC time 35 mins   Kyal Arts Patricia Pesa, M.D.  [facility]  Medical Director [place] Director [facility] Cardio-Pulmonary Department

## 2021-05-30 NOTE — Progress Notes (Signed)
Progress Note  Patient Name: Barbara [name] Date of Encounter: 05/30/2021  [facility] Cardiologist: Dr. Saunders Revel  Subjective   No acute events overnight, still intubated, sedated, requiring multiple pressors.  Inpatient Medications    Scheduled Meds:  aspirin  324 mg Oral Once   chlorhexidine gluconate (MEDLINE KIT)  15 mL Mouth Rinse BID   Chlorhexidine Gluconate Cloth  6 each Topical Daily   insulin aspart  0-9 Units Subcutaneous Q4H   levalbuterol  0.63 mg Nebulization Q6H   mouth rinse  15 mL Mouth Rinse 10 times per day   methylPREDNISolone (SOLU-MEDROL) injection  60 mg Intravenous Q6H   pantoprazole (PROTONIX) IV  40 mg Intravenous QHS   sodium chloride flush  10-40 mL Intracatheter Q12H   Continuous Infusions:  sodium chloride 5 mL/hr at 05/30/21 1132   cefTRIAXone (ROCEPHIN)  IV Stopped (05/30/21 0343)   norepinephrine (LEVOPHED) Adult infusion 30 mcg/min (05/30/21 1200)   phenylephrine (NEO-SYNEPHRINE) Adult infusion 320 mcg/min (05/30/21 1132)   propofol (DIPRIVAN) infusion 25 mcg/kg/min (05/30/21 1132)   vasopressin 0.03 Units/min (05/30/21 1132)   PRN Meds: sodium chloride, docusate sodium, fentaNYL (SUBLIMAZE) injection, midazolam, polyethylene glycol, sodium chloride flush, vecuronium   Vital Signs    Vitals:   05/30/21 1100 05/30/21 1130 05/30/21 1145 05/30/21 1200  BP: _0 (!) 81/62  Pulse: (!) 134 (!) 128 (!) 134 (!) 131  Resp: (!) 29 (!) 26 (!) 26 (!) 26  Temp: 100.04 F (37.8 C) 100.04 F (37.8 C) 100.22 F (37.9 C) 100.04 F (37.8 C)  TempSrc:    Bladder  SpO2: 96% 96% 98% 96%  Weight:      Height:        Intake/Output Summary (Last 24 hours) at 05/30/2021 1235 Last data filed at 05/30/2021 1132 Gross per 24 hour  Intake 2299.61 ml  Output 380 ml  Net 1919.61 ml   Last 3 Weights 05/29/2021 05/16/2021 05/27/2021  Weight (lbs) 129 lb 13.6 oz 127 lb 3.3 oz 102 lb  Weight (kg) 58.9 kg 57.7 kg 46.267 kg      Telemetry     Sinus tachycardia- Personally Reviewed  ECG    No new ECG- Personally Reviewed  Physical Exam   GEN: Intubated, sedated Neck: No JVD Cardiac: Tachycardic, distant heart sounds Respiratory: Vented breath sounds GI: Soft, nontender, non-distended  MS: No edema;  Neuro: Unable to assess Psych: Unable to assess  Labs    High Sensitivity Troponin:   Recent Labs  Lab 05/29/2021 1212 05/19/2021 1350 05/13/2021 2016 05/05/2021 2221 05/27/2021 2339  TROPONINIHS 54,008* 67,619* 8,867* 13,350* 12,910*      Chemistry Recent Labs  Lab 05/24/2021 1212 05/29/21 0353 05/29/21 1245 05/30/21 0504  NA 130* 136  --  144  K 3.6 3.1* 3.6 3.6  CL 95* 99  --  105  CO2 21* 26  --  26  GLUCOSE 207* 289*  --  217*  BUN 40* 48*  --  66*  CREATININE 1.82* 1.91*  --  2.01*  CALCIUM 8.8* 7.7*  --  7.5*  PROT 7.2  --   --   --   ALBUMIN 3.3*  --   --   --   AST 137*  --   --   --   ALT 25  --   --   --   ALKPHOS 63  --   --   --   BILITOT 0.9  --   --   --  GFRNONAA 28* 26*  --  25*  ANIONGAP 14 11  --  13     Hematology Recent Labs  Lab 05/09/2021 1212 06/03/2021 2017 05/30/21 0504  WBC 15.0*  --  19.0*  RBC 3.35*  --  2.86*  HGB 9.6* 8.9* 8.1*  HCT 28.2* 25.6* 24.5*  MCV 84.2  --  85.7  MCH 28.7  --  28.3  MCHC 34.0  --  33.1  RDW 14.1  --  14.7  PLT 301  --  370    BNP Recent Labs  Lab 05/16/2021 1212  BNP 1,699.7*     DDimer No results for input(s): DDIMER in the last 168 hours.   Radiology    DG Chest 1 View  Result Date: 05/29/2021 CLINICAL DATA:  Pneumothorax EXAM: CHEST  1 VIEW COMPARISON:  05/29/2021 FINDINGS: Endotracheal tube seen 4.8 cm above the carina. Nasogastric tube extends into the upper abdomen beyond the margin of the examination. Left internal jugular central venous catheter tip noted within the superior vena cava. The lungs are symmetrically well expanded, stable since prior examination. Extensive, diffuse airspace infiltrate is again seen throughout the  lungs, stable since prior examination, in keeping with diffuse pulmonary hemorrhage, pulmonary edema, or inflammatory infiltrate. No pneumothorax or pleural effusion. Cardiac size within normal limits. No acute bone abnormality. IMPRESSION: Support lines and tubes in appropriate position. No pneumothorax. Stable diffuse, extensive airspace infiltrate. Electronically Signed   By: Fidela Salisbury MD   On: 05/29/2021 00:56   DG Chest Port 1 View  Result Date: 05/10/2021 CLINICAL DATA:  Encounter for central line placement. EXAM: PORTABLE CHEST 1 VIEW COMPARISON:  May 28, 2021. FINDINGS: LEFT IJ central line placed since the previous study terminates in the area of the caval to atrial junction. Endotracheal tube terminates in the mid trachea approximately 3.9 cm from the carina. EKG leads project over the chest. Gastric tube courses through in off the field of the radiograph. Pacer defibrillator pads project over the LEFT chest and upper abdomen. Stable appearance of the chest with diffuse interstitial and airspace opacities. Cardiomediastinal contours remain obscured. On limited assessment no acute skeletal process. IMPRESSION: 1. LEFT IJ central line placed since the previous study, terminating in the area of the caval to atrial junction. 2. Otherwise no change in the appearance of the chest with diffuse interstitial and airspace opacities in this intubated patient. 3. Interval placement of a gastric tube, tip off the field of view. Electronically Signed   By: Zetta Bills M.D.   On: 06/02/2021 16:48   DG Chest Portable 1 View  Result Date: 05/11/2021 CLINICAL DATA:  Status post intubation. EXAM: PORTABLE CHEST 1 VIEW COMPARISON:  Earlier today. FINDINGS: Interval endotracheal tube in satisfactory position. Stable enlarged cardiac silhouette. Increased diffuse bilateral airspace opacity, more confluent on the right. No visible pleural fluid. Thoracic spine degenerative changes. IMPRESSION: 1. Endotracheal  tube in satisfactory position. 2. Increased extensive bilateral pneumonia or alveolar edema. Electronically Signed   By: Claudie Revering M.D.   On: 05/27/2021 16:03   DG Abd Portable 1V  Result Date: 05/29/2021 CLINICAL DATA:  Evaluate NG tube placement EXAM: PORTABLE ABDOMEN - 1 VIEW COMPARISON:  None. FINDINGS: The NG tube terminates in the left upper quadrant with the side port and distal tip in the region of the stomach. IMPRESSION: NG tube placement as above. The side port and distal tip are in the region of the stomach, below the GE junction. Electronically Signed   By: Shanon Brow  Mee Hives M.D   On: 05/29/2021 10:42   ECHOCARDIOGRAM LIMITED  Result Date: 05/30/2021    ECHOCARDIOGRAM LIMITED REPORT   Patient Name:   Barbara [name] Date of Exam: 05/23/2021 Medical Rec #:  [id number]           Height:       61.0 in Accession #:    [id number]          Weight:       102.0 lb Date of Birth:  10/10/1941           BSA:          1.419 m Patient Age:    40 years            BP:           84/58 mmHg Patient Gender: F                   HR:           108 bpm. Exam Location:  [facility] Procedure: Limited Echo, Color Doppler, Cardiac Doppler and Intracardiac            Opacification Agent Indications:     Elevated troponin  History:         Patient has prior history of Echocardiogram examinations, most                  recent 05/05/2021. Risk Factors:Hypertension and Diabetes.  Sonographer:     Sherrie Sport RDCS (AE) Referring Phys:  [id number] Rise Mu Diagnosing Phys: Nelva Bush MD  Sonographer Comments: Suboptimal apical window. IMPRESSIONS  1. Left ventricular ejection fraction, by estimation, is 30 to 35%. The left ventricle has moderately decreased function. The left ventricle demonstrates regional wall motion abnormalities (see scoring diagram/findings for description). There is mild left ventricular hypertrophy. There is severe hypokinesis of the left ventricular, mid-apical anterior wall, anterolateral wall,  anteroseptal wall, apical segment and inferior wall. Findings are most consistent with large LAD territory artifact, though stress-induced cardiomyopathy could also have this appearance.  2. Left atrial size was mildly dilated.  3. The mitral valve is normal in structure. Mild mitral valve regurgitation.  4. Tricuspid valve regurgitation is mild to moderate.  5. The aortic valve is tricuspid. There is mild thickening of the aortic valve. Comparison(s): LVEF has decreased significantly since 05/05/2021. FINDINGS  Left Ventricle: No evidence apical thrombus. Left ventricular ejection fraction, by estimation, is 30 to 35%. The left ventricle has moderately decreased function. The left ventricle demonstrates regional wall motion abnormalities. Severe hypokinesis of  the left ventricular, mid-apical anterior wall, anterolateral wall, anteroseptal wall, apical segment and inferior wall. Definity contrast agent was given IV to delineate the left ventricular endocardial borders. The left ventricular internal cavity size was normal in size. There is mild left ventricular hypertrophy. Right Ventricle: No increase in right ventricular wall thickness. Left Atrium: Left atrial size was mildly dilated. Right Atrium: Right atrial size was normal in size. Mitral Valve: The mitral valve is normal in structure. Mild mitral valve regurgitation. Tricuspid Valve: The tricuspid valve is normal in structure. Tricuspid valve regurgitation is mild to moderate. Aortic Valve: The aortic valve is tricuspid. There is mild thickening of the aortic valve. Pulmonic Valve: The pulmonic valve was grossly normal. Pulmonic valve regurgitation is not visualized. No evidence of pulmonic stenosis. Pulmonary Artery: The pulmonary artery is of normal size. LEFT VENTRICLE PLAX 2D LVIDd:         4.86 cm  LVIDs:         3.80 cm LV PW:         0.96 cm LV IVS:        1.29 cm LVOT diam:     2.10 cm LVOT Area:     3.46 cm  LEFT ATRIUM           Index       RIGHT  ATRIUM          Index LA diam:      4.80 cm 3.38 cm/m  RA Area:     9.75 cm LA Vol (A4C): 59.2 ml 41.73 ml/m RA Volume:   18.10 ml 12.76 ml/m                        PULMONIC VALVE AORTA                 PV Vmax:        0.65 m/s Ao Root diam: 2.90 cm PV Peak grad:   1.7 mmHg                       RVOT Peak grad: 3 mmHg  TRICUSPID VALVE TR Peak grad:   59.9 mmHg TR Vmax:        387.00 cm/s  SHUNTS Systemic Diam: 2.10 cm Nelva Bush MD Electronically signed by Nelva Bush MD Signature Date/Time: 05/22/2021/5:45:41 PM    Final     Cardiac Studies   Echo 05/30/2021  1. Left ventricular ejection fraction, by estimation, is 30 to 35%. The  left ventricle has moderately decreased function. The left ventricle  demonstrates regional wall motion abnormalities (see scoring  diagram/findings for description). There is mild  left ventricular hypertrophy. There is severe hypokinesis of the left  ventricular, mid-apical anterior wall, anterolateral wall, anteroseptal  wall, apical segment and inferior wall. Findings are most consistent with  large LAD territory artifact, though  stress-induced cardiomyopathy could also have this appearance.   2. Left atrial size was mildly dilated.   3. The mitral valve is normal in structure. Mild mitral valve  regurgitation.   4. Tricuspid valve regurgitation is mild to moderate.   5. The aortic valve is tricuspid. There is mild thickening of the aortic  valve.   Patient Profile     80 y.o. female with history of ILD, diabetes, hypertension, hyperlipidemia presenting with chest pain, shortness of breath diagnosed with NSTEMI, new reduced ejection fraction with EF 30 to 35%, cardiogenic shock requiring intubation and multiple pressors  Assessment & Plan    1.  CHF, EF 30-35% -Hypotensive -On pressors for support -Continue supportive care  2.  NSTEMI -Troponins peaked at [id number] -Heparin stopped due to hemoptysis. -left heart cath when respiratory/clinical  status improves and renal function permits.  3.  ILD, respiratory failure -Intubated for support -Vent management as per critical care team  Overall prognosis remains guarded.  Continue supportive care as above.  Total encounter time 35 minutes  Greater than 50% was spent in counseling and coordination of care with the patient    Signed, Kate Sable, MD  05/30/2021, 12:35 PM

## 2021-05-30 NOTE — Progress Notes (Signed)
Name: Barbara [name] MRN: [id number] DOB: 1941-09-02     CONSULTATION DATE: 05/06/2021  REFERRING MD :  Jari Pigg   CHIEF COMPLAINT:  Chest pain  HISTORY OF PRESENT ILLNESS:  80 yo WF never smoker recently admitted for hypoxic respiratory failure in May, where she was found to have stigmata of interstitial lung disease and concurrent pulmonary edema, now presents with complaints of chest pain There was a concurrent cough productive of some purulent sputum over the past several days. CXR revealed bilateral interstitial edema. A troponin returned in excess of 16k, and cards saw her in the ED with a prelim echo reporting new reduction in EF 30-35%. She failed NIV  for accruing oxygen need, and right before intubation developed some hemoptysis (heparin was initiated for ACS but stopped). With intubation she required a levophed drip.   SIGNIFICANT EVENTS 6/24 admitted to ICU 6/26 remains on vent  INITERVAL HISTORY Remains on vent Remains critically ill   Allergies  Allergen Reactions  . Atorvastatin     REACTION: increased liver fxn tests  . Ezetimibe     REACTION: stomach upset/malaise  . Sulfonamide Derivatives     REACTION: rash   OBJECTIVE: Estimated body mass index is 24.54 kg/m as calculated from the following:   Height as of this encounter: 5\' 1"  (1.549 m).   Weight as of this encounter: 58.9 kg.    VITAL SIGNS: Temp:  [91.22 F (32.9 C)-100.04 F (37.8 C)] 100.04 F (37.8 C) (06/26 0800) Pulse Rate:  [119-135] 129 (06/26 0800) Resp:  [21-31] 27 (06/26 0800) BP: (65-112)/(49-93) 87/61 (06/26 0800) SpO2:  [87 %-99 %] 96 % (06/26 0800) FiO2 (%):  [80 %-100 %] 80 % (06/26 0745)   I/O last 3 completed shifts: In: 3688.5 [I.V.:3046.2; NG/GT:90; IV Piggyback:552.3] Out: 660 [Urine:660] Total I/O In: 178.8 [I.V.:178.8] Out: 95 [Urine:95]   SpO2: 96 % O2 Flow Rate (L/min): 15 L/min FiO2 (%): 80 %    REVIEW OF SYSTEMS  PATIENT IS UNABLE TO PROVIDE  COMPLETE REVIEW OF SYSTEMS DUE TO SEVERE CRITICAL ILLNESS AND TOXIC METABOLIC ENCEPHALOPATHY  PHYSICAL EXAMINATION:  GENERAL:critically ill appearing, +resp distress HEAD: Normocephalic, atraumatic.  EYES: Pupils equal, round, reactive to light.  No scleral icterus.  MOUTH: Moist mucosal membrane. NECK: Supple. No thyromegaly. No nodules. No JVD.  PULMONARY: +rhonchi, +wheezing CARDIOVASCULAR: S1 and S2. Regular rate and rhythm. No murmurs, rubs, or gallops.  GASTROINTESTINAL: Soft, nontender, -distended. Positive bowel sounds.  MUSCULOSKELETAL: No swelling, clubbing, or edema.  NEUROLOGIC: obtunded SKIN:intact,warm,dry    CULTURE RESULTS   Recent Results (from the past 240 hour(s))  Blood culture (routine x 2)     Status: None (Preliminary result)   Collection Time: 05/26/2021 12:11 PM   Specimen: BLOOD  Result Value Ref Range Status   Specimen Description BLOOD LEFT ANTECUBITAL  Final   Special Requests   Final    BOTTLES DRAWN AEROBIC AND ANAEROBIC Blood Culture adequate volume   Culture   Final    NO GROWTH < 24 HOURS Performed at [facility], [address], [place], [place] [address]    Report Status PENDING  Incomplete  Resp Panel by RT-PCR (Flu A&B, Covid) Nasopharyngeal Swab     Status: None   Collection Time: 05/20/2021 12:12 PM   Specimen: Nasopharyngeal Swab; Nasopharyngeal(NP) swabs in vial transport medium  Result Value Ref Range Status   SARS Coronavirus 2 by RT PCR NEGATIVE NEGATIVE Final    Comment: (NOTE) SARS-CoV-2 target nucleic acids  are NOT DETECTED.  The SARS-CoV-2 RNA is generally detectable in upper respiratory specimens during the acute phase of infection. The lowest concentration of SARS-CoV-2 viral copies this assay can detect is 138 copies/mL. A negative result does not preclude SARS-Cov-2 infection and should not be used as the sole basis for treatment or other patient management decisions. A negative result may occur with  improper  specimen collection/handling, submission of specimen other than nasopharyngeal swab, presence of viral mutation(s) within the areas targeted by this assay, and inadequate number of viral copies(<138 copies/mL). A negative result must be combined with clinical observations, patient history, and epidemiological information. The expected result is Negative.  Fact Sheet for Patients:  EntrepreneurPulse.com.au  Fact Sheet for Healthcare Providers:  IncredibleEmployment.be  This test is no t yet approved or cleared by the [place] FDA and  has been authorized for detection and/or diagnosis of SARS-CoV-2 by FDA under an Emergency Use Authorization (EUA). This EUA will remain  in effect (meaning this test can be used) for the duration of the COVID-19 declaration under Section 564(b)(1) of the Act, 21 U.S.C.section 360bbb-3(b)(1), unless the authorization is terminated  or revoked sooner.       Influenza A by PCR NEGATIVE NEGATIVE Final   Influenza B by PCR NEGATIVE NEGATIVE Final    Comment: (NOTE) The Xpert Xpress SARS-CoV-2/FLU/RSV plus assay is intended as an aid in the diagnosis of influenza from Nasopharyngeal swab specimens and should not be used as a sole basis for treatment. Nasal washings and aspirates are unacceptable for Xpert Xpress SARS-CoV-2/FLU/RSV testing.  Fact Sheet for Patients: EntrepreneurPulse.com.au  Fact Sheet for Healthcare Providers: IncredibleEmployment.be  This test is not yet approved or cleared by the [place] FDA and has been authorized for detection and/or diagnosis of SARS-CoV-2 by FDA under an Emergency Use Authorization (EUA). This EUA will remain in effect (meaning this test can be used) for the duration of the COVID-19 declaration under Section 564(b)(1) of the Act, 21 U.S.C. section 360bbb-3(b)(1), unless the authorization is terminated or revoked.  Performed at  [facility], [address] [place], [place] [address]   Blood culture (routine x 2)     Status: None (Preliminary result)   Collection Time: 05/11/2021  1:50 PM   Specimen: BLOOD  Result Value Ref Range Status   Specimen Description   Final    BLOOD RIGHT ANTECUBITAL Performed at [facility], [address] [place] [address], [place], [place] [address]    Special Requests   Final    BOTTLES DRAWN AEROBIC AND ANAEROBIC Blood Culture results may not be optimal due to an inadequate volume of blood received in culture bottles Performed at [facility], [address] [place], [place] [address]    Culture  Setup Time   Final    GRAM NEGATIVE RODS IN BOTH AEROBIC AND ANAEROBIC BOTTLES CRITICAL RESULT CALLED TO, READ BACK BY AND VERIFIED WITHCorene Cornea ROBINS @ 3329 05/29/21 LFD Performed at [facility], [place] [address], [place], [place] [address]    Culture GRAM NEGATIVE RODS  Final   Report Status PENDING  Incomplete  Blood Culture ID Panel (Reflexed)     Status: Abnormal   Collection Time: 05/22/2021  1:50 PM  Result Value Ref Range Status   Enterococcus faecalis NOT DETECTED NOT DETECTED Final   Enterococcus Faecium NOT DETECTED NOT DETECTED Final   Listeria monocytogenes NOT DETECTED NOT DETECTED Final   Staphylococcus species NOT DETECTED NOT DETECTED Final   Staphylococcus  aureus (BCID) NOT DETECTED NOT DETECTED Final   Staphylococcus epidermidis NOT DETECTED NOT DETECTED Final   Staphylococcus lugdunensis NOT DETECTED NOT DETECTED Final   Streptococcus species NOT DETECTED NOT DETECTED Final   Streptococcus agalactiae NOT DETECTED NOT DETECTED Final   Streptococcus pneumoniae NOT DETECTED NOT DETECTED Final   Streptococcus pyogenes NOT DETECTED NOT DETECTED Final   A.calcoaceticus-baumannii NOT DETECTED NOT DETECTED Final   Bacteroides fragilis NOT DETECTED NOT DETECTED Final   Enterobacterales DETECTED (A) NOT DETECTED Final    Comment: Enterobacterales  represent a large order of gram negative bacteria, not a single organism. CRITICAL RESULT CALLED TO, READ BACK BY AND VERIFIED WITH: JASON ROBINS @ 0932 05/29/21 LFD    Enterobacter cloacae complex NOT DETECTED NOT DETECTED Final   Escherichia coli DETECTED (A) NOT DETECTED Final    Comment: CRITICAL RESULT CALLED TO, READ BACK BY AND VERIFIED WITH: JASON ROBINS @ 6712 05/29/21 LFD    Klebsiella aerogenes NOT DETECTED NOT DETECTED Final   Klebsiella oxytoca NOT DETECTED NOT DETECTED Final   Klebsiella pneumoniae NOT DETECTED NOT DETECTED Final   Proteus species NOT DETECTED NOT DETECTED Final   Salmonella species NOT DETECTED NOT DETECTED Final   Serratia marcescens NOT DETECTED NOT DETECTED Final   Haemophilus influenzae NOT DETECTED NOT DETECTED Final   Neisseria meningitidis NOT DETECTED NOT DETECTED Final   Pseudomonas aeruginosa NOT DETECTED NOT DETECTED Final   Stenotrophomonas maltophilia NOT DETECTED NOT DETECTED Final   Candida albicans NOT DETECTED NOT DETECTED Final   Candida auris NOT DETECTED NOT DETECTED Final   Candida glabrata NOT DETECTED NOT DETECTED Final   Candida krusei NOT DETECTED NOT DETECTED Final   Candida parapsilosis NOT DETECTED NOT DETECTED Final   Candida tropicalis NOT DETECTED NOT DETECTED Final   Cryptococcus neoformans/gattii NOT DETECTED NOT DETECTED Final   CTX-M ESBL NOT DETECTED NOT DETECTED Final   Carbapenem resistance IMP NOT DETECTED NOT DETECTED Final   Carbapenem resistance KPC NOT DETECTED NOT DETECTED Final   Carbapenem resistance NDM NOT DETECTED NOT DETECTED Final   Carbapenem resist OXA 48 LIKE NOT DETECTED NOT DETECTED Final   Carbapenem resistance VIM NOT DETECTED NOT DETECTED Final    Comment: Performed at [facility], [address] [place], [place] [address]  Culture, Respiratory w Gram Stain     Status: None (Preliminary result)   Collection Time: 05/07/2021  4:47 PM   Specimen: INDUCED SPUTUM  Result Value Ref  Range Status   Specimen Description   Final    INDUCED SPUTUM Performed at [facility], [address], [place], [place] [address]    Special Requests   Final    NONE Performed at [facility], [address] [place], [place] [address]    Gram Stain   Final    FEW WBC PRESENT,BOTH PMN AND MONONUCLEAR NO ORGANISMS SEEN    Culture   Final    NO GROWTH < 24 HOURS Performed at [facility], [address] [place], [place] [address]    Report Status PENDING  Incomplete  MRSA Next Gen by PCR, Nasal     Status: None   Collection Time: 05/08/2021  6:43 PM   Specimen: Nasal Mucosa; Nasal Swab  Result Value Ref Range Status   MRSA by PCR Next Gen NOT DETECTED NOT DETECTED Final    Comment: (NOTE) The GeneXpert MRSA Assay (FDA approved for NASAL specimens only), is one component of a comprehensive MRSA colonization surveillance program.  It is not intended to diagnose MRSA infection nor to guide or monitor treatment for MRSA infections. Test performance is not FDA approved in patients less than 62 years old. Performed at [facility], [address] [place], [place] [address]           Indwelling Urinary Catheter continued, requirement due to   Reason to continue Indwelling Urinary Catheter strict Intake/Output monitoring for hemodynamic instability         Ventilator continued, requirement due to severe respiratory failure   Ventilator Sedation RASS 0 to -2      ASSESSMENT AND PLAN SYNOPSIS  RESPIRATORY Severe ACUTE Hypoxic and Hypercapnic Respiratory Failure: ILD, acute exacerbation and pneumonia Hemoptysis/bronchiectasis    Severe ACUTE Hypoxic and Hypercapnic Respiratory Failure -continue Mechanical Ventilator support -continue Bronchodilator Therapy -Wean Fio2 and PEEP as tolerated -VAP/VENT bundle implementation Severe hypoxia Vent Mode: PRVC FiO2 (%):  [80 %-100 %] 80 % Set Rate:  [26 bmp] 26 bmp Vt Set:  [320 mL] 320  mL PEEP:  [5 cmH20] 5 cmH20 Plateau Pressure:  [28 cmH20-33 cmH20] 33 QAS34   ACUTE SYSTOLIC CARDIAC FAILURE- EF 30% -oxygen as needed -Lasix as tolerated -follow up cardiac enzymes as indicated  ACUTE KIDNEY INJURY/Renal Failure -continue Foley Catheter-assess need -Avoid nephrotoxic agents -Follow urine output, BMP -Ensure adequate renal perfusion, optimize oxygenation -Renal dose medications  INFECTIOUS DISEASE -continue antibiotics as prescribed -follow up cultures    NEUROLOGY ACUTE TOXIC METABOLIC ENCEPHALOPATHY -need for sedation -Goal RASS -2 to -3    GI GI PROPHYLAXIS as indicated  NUTRITIONAL STATUS DIET-->TF's as tolerated Constipation protocol as indicated  ENDO - ICU hypoglycemic\Hyperglycemia protocol -check FSBS per protocol   ELECTROLYTES -follow labs as needed -replace as needed -pharmacy consultation and following     DVT/GI PRX  assessed I Assessed the need for Labs I Assessed the need for Foley I Assessed the need for Central Venous Line Family Discussion when available I Assessed the need for Mobilization I made an Assessment of medications to be adjusted accordingly Safety Risk assessment completed  CASE DISCUSSED IN MULTIDISCIPLINARY ROUNDS WITH ICU TEAM     Critical Care Time devoted to patient care services described in this note is 45 minutes.  Critical care was necessary to treat /prevent imminent and life-threatening deterioration. Overall, patient is critically ill, prognosis is guarded.  Patient with Multiorgan failure and at high risk for cardiac arrest and death.    Corrin Parker, M.D.  [facility]  Medical Director [place] Director [facility] Cardio-Pulmonary Department

## 2021-05-30 NOTE — Consult Note (Signed)
PHARMACY CONSULT NOTE - FOLLOW UP  Pharmacy Consult for Electrolyte Monitoring and Replacement   Recent Labs: Potassium (mmol/L)  Date Value  05/30/2021 3.6   Magnesium (mg/dL)  Date Value  05/29/2021 1.9   Calcium (mg/dL)  Date Value  05/30/2021 7.5 (L)   Albumin (g/dL)  Date Value  05/18/2021 3.3 (L)   Phosphorus (mg/dL)  Date Value  05/29/2021 7.9 (H)   Sodium (mmol/L)  Date Value  05/30/2021 144     Assessment: Pharmacy has been consulted to monitor and replace electrolytes in 80yo patient who presented to the ED with chest pain. Patient required intubation for acute respiratory failure. She is now requiring vasopressors for blood pressure support.  Goal of Therapy:  Electrolytes WNL  Plan:  No electrolyte replacement indicated Follow up electrolytes as ordered by team  Tawnya Crook ,PharmD Clinical Pharmacist 05/30/2021 7:11 AM

## 2021-05-31 DIAGNOSIS — Z452 Encounter for adjustment and management of vascular access device: Secondary | ICD-10-CM

## 2021-05-31 LAB — CULTURE, RESPIRATORY W GRAM STAIN: Culture: NORMAL

## 2021-05-31 LAB — BLOOD GAS, ARTERIAL
Acid-base deficit: 6.2 mmol/L — ABNORMAL HIGH (ref 0.0–2.0)
Bicarbonate: 23.2 mmol/L (ref 20.0–28.0)
FIO2: 0.7
MECHVT: 320 mL
O2 Saturation: 96.2 %
PEEP: 10 cmH2O
Patient temperature: 37
RATE: 26 resp/min
pCO2 arterial: 73 mmHg (ref 32.0–48.0)
pH, Arterial: 7.11 — CL (ref 7.350–7.450)
pO2, Arterial: 109 mmHg — ABNORMAL HIGH (ref 83.0–108.0)

## 2021-05-31 LAB — CULTURE, BLOOD (ROUTINE X 2)

## 2021-05-31 LAB — BASIC METABOLIC PANEL
Anion gap: 13 (ref 5–15)
BUN: 88 mg/dL — ABNORMAL HIGH (ref 8–23)
CO2: 23 mmol/L (ref 22–32)
Calcium: 7.2 mg/dL — ABNORMAL LOW (ref 8.9–10.3)
Chloride: 109 mmol/L (ref 98–111)
Creatinine, Ser: 2.62 mg/dL — ABNORMAL HIGH (ref 0.44–1.00)
GFR, Estimated: 18 mL/min — ABNORMAL LOW (ref >60)
Glucose, Bld: 184 mg/dL — ABNORMAL HIGH (ref 70–99)
Potassium: 3.9 mmol/L (ref 3.5–5.1)
Sodium: 145 mmol/L (ref 135–145)

## 2021-05-31 LAB — GLUCOSE, CAPILLARY
Glucose-Capillary: 138 mg/dL — ABNORMAL HIGH (ref 70–99)
Glucose-Capillary: 147 mg/dL — ABNORMAL HIGH (ref 70–99)
Glucose-Capillary: 153 mg/dL — ABNORMAL HIGH (ref 70–99)
Glucose-Capillary: 154 mg/dL — ABNORMAL HIGH (ref 70–99)
Glucose-Capillary: 170 mg/dL — ABNORMAL HIGH (ref 70–99)
Glucose-Capillary: 174 mg/dL — ABNORMAL HIGH (ref 70–99)

## 2021-05-31 LAB — COOXEMETRY PANEL
Carboxyhemoglobin: 0.9 % (ref 0.5–1.5)
Methemoglobin: 0 % (ref 0.0–1.5)
O2 Saturation: 75.4 %
Total oxygen content: 80.3 mL/dL

## 2021-05-31 LAB — CBC
HCT: 24.2 % — ABNORMAL LOW (ref 36.0–46.0)
Hemoglobin: 7.8 g/dL — ABNORMAL LOW (ref 12.0–15.0)
MCH: 28.7 pg (ref 26.0–34.0)
MCHC: 32.2 g/dL (ref 30.0–36.0)
MCV: 89 fL (ref 80.0–100.0)
Platelets: 381 10*3/uL (ref 150–400)
RBC: 2.72 MIL/uL — ABNORMAL LOW (ref 3.87–5.11)
RDW: 15 % (ref 11.5–15.5)
WBC: 18.5 10*3/uL — ABNORMAL HIGH (ref 4.0–10.5)
nRBC: 0.8 % — ABNORMAL HIGH (ref 0.0–0.2)

## 2021-05-31 LAB — URINE CULTURE: Culture: 40000 — AB

## 2021-05-31 LAB — MAGNESIUM: Magnesium: 2.5 mg/dL — ABNORMAL HIGH (ref 1.7–2.4)

## 2021-05-31 MED ORDER — SODIUM CHLORIDE 0.9 % IV SOLN
3.0000 g | Freq: Two times a day (BID) | INTRAVENOUS | Status: DC
  Filled 2021-05-31: qty 8

## 2021-05-31 MED ORDER — HYDROCORTISONE NA SUCCINATE PF 100 MG IJ SOLR
50.0000 mg | Freq: Four times a day (QID) | INTRAMUSCULAR | Status: DC
  Administered 2021-05-31 – 2021-06-01 (×3): 50 mg via INTRAVENOUS
  Filled 2021-05-31 (×2): qty 2

## 2021-05-31 MED ORDER — ACETAMINOPHEN 325 MG PO TABS
650.0000 mg | ORAL_TABLET | ORAL | Status: DC | PRN
  Administered 2021-05-31 – 2021-06-01 (×2): 650 mg
  Filled 2021-05-31: qty 2

## 2021-05-31 MED ORDER — MILRINONE LACTATE IN DEXTROSE 20-5 MG/100ML-% IV SOLN
0.2500 ug/kg/min | INTRAVENOUS | Status: DC
  Administered 2021-05-31: 0.25 ug/kg/min via INTRAVENOUS
  Filled 2021-05-31 (×2): qty 100

## 2021-05-31 MED ORDER — SODIUM CHLORIDE 0.9 % IV SOLN
3.0000 g | INTRAVENOUS | Status: DC
  Filled 2021-05-31: qty 8

## 2021-05-31 MED ORDER — SODIUM CHLORIDE 0.9 % IV SOLN
3.0000 g | Freq: Two times a day (BID) | INTRAVENOUS | Status: DC
  Administered 2021-05-31 – 2021-06-01 (×2): 3 g via INTRAVENOUS
  Filled 2021-05-31 (×2): qty 8
  Filled 2021-05-31: qty 3

## 2021-05-31 NOTE — Progress Notes (Signed)
GOALS OF CARE DISCUSSION  The Clinical status was relayed to family in detail. Sister at bedside  Updated and notified of patients medical condition.    Patient remains unresponsive and will not open eyes to command.   Patient is having a weak cough and struggling to remove secretions.   Patient with increased WOB and using accessory muscles to breathe Explained to family course of therapy and the modalities   PATIENT IS SUFFERING AND IN THE DYING PROCESS RECOMMEND COMFORT MEASURES SOONER THAN LATER CONTINUE END OF LIFE VISITATION POLICY  Patient with Progressive multiorgan failure with a very high probablity of a very minimal chance of meaningful recovery despite all aggressive and optimal medical therapy.  PATIENT REMAINS DNR  Family understands the situation.   Family are satisfied with Plan of action and management. All questions answered  Additional CC time 35 mins   Jerita Wimbush Patricia Pesa, M.D.  [facility]  Medical Director [place] Director [facility] Cardio-Pulmonary Department

## 2021-05-31 NOTE — Progress Notes (Signed)
Name: Barbara [name] MRN: [id number] DOB: 1941-10-17     CONSULTATION DATE: 05/26/2021  REFERRING MD :  Jari Pigg   CHIEF COMPLAINT:  Chest pain  HISTORY OF PRESENT ILLNESS:  80 yo WF never smoker recently admitted for hypoxic respiratory failure in May, where she was found to have stigmata of interstitial lung disease and concurrent pulmonary edema, now presents with complaints of chest pain There was a concurrent cough productive of some purulent sputum over the past several days. CXR revealed bilateral interstitial edema. A troponin returned in excess of 16k, and cards saw her in the ED with a prelim echo reporting new reduction in EF 30-35%. She failed NIV  for accruing oxygen need, and right before intubation developed some hemoptysis (heparin was initiated for ACS but stopped). With intubation she required a levophed drip.   SIGNIFICANT EVENTS 6/24 admitted to ICU 6/26 remains on vent 6/27 remains on vent  INITERVAL HISTORY Multiorgan failure Severe hypoxia Severe s CHF Severe ILD Progressive renal failure     Allergies  Allergen Reactions  . Atorvastatin     REACTION: increased liver fxn tests  . Ezetimibe     REACTION: stomach upset/malaise  . Sulfonamide Derivatives     REACTION: rash   OBJECTIVE: Estimated body mass index is 24.62 kg/m as calculated from the following:   Height as of this encounter: 5\' 1"  (1.549 m).   Weight as of this encounter: 59.1 kg.    VITAL SIGNS: Temp:  [99.5 F (37.5 C)-100.58 F (38.1 C)] 100.22 F (37.9 C) (06/27 0630) Pulse Rate:  [128-141] 141 (06/27 0600) Resp:  [18-52] 30 (06/27 0600) BP: (73-98)/(52-73) 81/59 (06/27 0645) SpO2:  [92 %-98 %] 92 % (06/27 0600) FiO2 (%):  [60 %-80 %] 60 % (06/27 0406) Weight:  [59.1 kg] 59.1 kg (06/27 0415)   I/O last 3 completed shifts: In: 3517.1 [I.V.:3317.2; IV Piggyback:199.9] Out: 520 [Urine:520] No intake/output data recorded.   SpO2: 92 % O2 Flow Rate (L/min): 15  L/min FiO2 (%): 60 %  REVIEW OF SYSTEMS  PATIENT IS UNABLE TO PROVIDE COMPLETE REVIEW OF SYSTEMS DUE TO SEVERE CRITICAL ILLNESS AND TOXIC METABOLIC ENCEPHALOPATHY  PHYSICAL EXAMINATION:  GENERAL:critically ill appearing, +resp distress HEAD: Normocephalic, atraumatic.  EYES: Pupils equal, round, reactive to light.  No scleral icterus.  MOUTH: Moist mucosal membrane. NECK: Supple. No thyromegaly. No nodules. No JVD.  PULMONARY: +rhonchi, +wheezing CARDIOVASCULAR: S1 and S2. Regular rate and rhythm. No murmurs, rubs, or gallops.  GASTROINTESTINAL: Soft, nontender, -distended. Positive bowel sounds.  MUSCULOSKELETAL: No swelling, clubbing, or edema.  NEUROLOGIC: obtunded SKIN:intact,warm,dry     CULTURE RESULTS   Recent Results (from the past 240 hour(s))  Blood culture (routine x 2)     Status: None (Preliminary result)   Collection Time: 05/15/2021 12:11 PM   Specimen: BLOOD  Result Value Ref Range Status   Specimen Description BLOOD LEFT ANTECUBITAL  Final   Special Requests   Final    BOTTLES DRAWN AEROBIC AND ANAEROBIC Blood Culture adequate volume   Culture   Final    NO GROWTH 3 DAYS Performed at [facility], [address] [place], [place] [address]    Report Status PENDING  Incomplete  Resp Panel by RT-PCR (Flu A&B, Covid) Nasopharyngeal Swab     Status: None   Collection Time: 05/31/2021 12:12 PM   Specimen: Nasopharyngeal Swab; Nasopharyngeal(NP) swabs in vial transport medium  Result Value Ref Range Status   SARS Coronavirus 2 by RT  PCR NEGATIVE NEGATIVE Final    Comment: (NOTE) SARS-CoV-2 target nucleic acids are NOT DETECTED.  The SARS-CoV-2 RNA is generally detectable in upper respiratory specimens during the acute phase of infection. The lowest concentration of SARS-CoV-2 viral copies this assay can detect is 138 copies/mL. A negative result does not preclude SARS-Cov-2 infection and should not be used as the sole basis for treatment or other  patient management decisions. A negative result may occur with  improper specimen collection/handling, submission of specimen other than nasopharyngeal swab, presence of viral mutation(s) within the areas targeted by this assay, and inadequate number of viral copies(<138 copies/mL). A negative result must be combined with clinical observations, patient history, and epidemiological information. The expected result is Negative.  Fact Sheet for Patients:  EntrepreneurPulse.com.au  Fact Sheet for Healthcare Providers:  IncredibleEmployment.be  This test is no t yet approved or cleared by the [place] FDA and  has been authorized for detection and/or diagnosis of SARS-CoV-2 by FDA under an Emergency Use Authorization (EUA). This EUA will remain  in effect (meaning this test can be used) for the duration of the COVID-19 declaration under Section 564(b)(1) of the Act, 21 U.S.C.section 360bbb-3(b)(1), unless the authorization is terminated  or revoked sooner.       Influenza A by PCR NEGATIVE NEGATIVE Final   Influenza B by PCR NEGATIVE NEGATIVE Final    Comment: (NOTE) The Xpert Xpress SARS-CoV-2/FLU/RSV plus assay is intended as an aid in the diagnosis of influenza from Nasopharyngeal swab specimens and should not be used as a sole basis for treatment. Nasal washings and aspirates are unacceptable for Xpert Xpress SARS-CoV-2/FLU/RSV testing.  Fact Sheet for Patients: EntrepreneurPulse.com.au  Fact Sheet for Healthcare Providers: IncredibleEmployment.be  This test is not yet approved or cleared by the [place] FDA and has been authorized for detection and/or diagnosis of SARS-CoV-2 by FDA under an Emergency Use Authorization (EUA). This EUA will remain in effect (meaning this test can be used) for the duration of the COVID-19 declaration under Section 564(b)(1) of the Act, 21 U.S.C. section  360bbb-3(b)(1), unless the authorization is terminated or revoked.  Performed at [facility], [address] [place], [place] [address]   Blood culture (routine x 2)     Status: Abnormal (Preliminary result)   Collection Time: 05/05/2021  1:50 PM   Specimen: BLOOD  Result Value Ref Range Status   Specimen Description   Final    BLOOD RIGHT ANTECUBITAL Performed at [facility], [address], [place], [place] [address]    Special Requests   Final    BOTTLES DRAWN AEROBIC AND ANAEROBIC Blood Culture results may not be optimal due to an inadequate volume of blood received in culture bottles Performed at [facility], [address], [place], [place] [address]    Culture  Setup Time   Final    GRAM NEGATIVE RODS IN BOTH AEROBIC AND ANAEROBIC BOTTLES CRITICAL RESULT CALLED TO, READ BACK BY AND VERIFIED WITH: JASON ROBINS @ 7672 05/29/21 LFD    Culture (A)  Final    ESCHERICHIA COLI SUSCEPTIBILITIES TO FOLLOW Performed at [facility], [place] [address], [place], [place] [address]    Report Status PENDING  Incomplete  Blood Culture ID Panel (Reflexed)     Status: Abnormal   Collection Time: 06/03/2021  1:50 PM  Result Value Ref Range Status   Enterococcus faecalis NOT DETECTED NOT DETECTED Final   Enterococcus Faecium NOT DETECTED NOT DETECTED Final  Listeria monocytogenes NOT DETECTED NOT DETECTED Final   Staphylococcus species NOT DETECTED NOT DETECTED Final   Staphylococcus aureus (BCID) NOT DETECTED NOT DETECTED Final   Staphylococcus epidermidis NOT DETECTED NOT DETECTED Final   Staphylococcus lugdunensis NOT DETECTED NOT DETECTED Final   Streptococcus species NOT DETECTED NOT DETECTED Final   Streptococcus agalactiae NOT DETECTED NOT DETECTED Final   Streptococcus pneumoniae NOT DETECTED NOT DETECTED Final   Streptococcus pyogenes NOT DETECTED NOT DETECTED Final   A.calcoaceticus-baumannii NOT DETECTED NOT DETECTED Final   Bacteroides  fragilis NOT DETECTED NOT DETECTED Final   Enterobacterales DETECTED (A) NOT DETECTED Final    Comment: Enterobacterales represent a large order of gram negative bacteria, not a single organism. CRITICAL RESULT CALLED TO, READ BACK BY AND VERIFIED WITH: JASON ROBINS @ 2993 05/29/21 LFD    Enterobacter cloacae complex NOT DETECTED NOT DETECTED Final   Escherichia coli DETECTED (A) NOT DETECTED Final    Comment: CRITICAL RESULT CALLED TO, READ BACK BY AND VERIFIED WITH: JASON ROBINS @ 7169 05/29/21 LFD    Klebsiella aerogenes NOT DETECTED NOT DETECTED Final   Klebsiella oxytoca NOT DETECTED NOT DETECTED Final   Klebsiella pneumoniae NOT DETECTED NOT DETECTED Final   Proteus species NOT DETECTED NOT DETECTED Final   Salmonella species NOT DETECTED NOT DETECTED Final   Serratia marcescens NOT DETECTED NOT DETECTED Final   Haemophilus influenzae NOT DETECTED NOT DETECTED Final   Neisseria meningitidis NOT DETECTED NOT DETECTED Final   Pseudomonas aeruginosa NOT DETECTED NOT DETECTED Final   Stenotrophomonas maltophilia NOT DETECTED NOT DETECTED Final   Candida albicans NOT DETECTED NOT DETECTED Final   Candida auris NOT DETECTED NOT DETECTED Final   Candida glabrata NOT DETECTED NOT DETECTED Final   Candida krusei NOT DETECTED NOT DETECTED Final   Candida parapsilosis NOT DETECTED NOT DETECTED Final   Candida tropicalis NOT DETECTED NOT DETECTED Final   Cryptococcus neoformans/gattii NOT DETECTED NOT DETECTED Final   CTX-M ESBL NOT DETECTED NOT DETECTED Final   Carbapenem resistance IMP NOT DETECTED NOT DETECTED Final   Carbapenem resistance KPC NOT DETECTED NOT DETECTED Final   Carbapenem resistance NDM NOT DETECTED NOT DETECTED Final   Carbapenem resist OXA 48 LIKE NOT DETECTED NOT DETECTED Final   Carbapenem resistance VIM NOT DETECTED NOT DETECTED Final    Comment: Performed at [facility], [address] [place], [place] [address]  Urine culture     Status: Abnormal  (Preliminary result)   Collection Time: 05/20/2021  4:16 PM   Specimen: Urine, Random  Result Value Ref Range Status   Specimen Description   Final    URINE, RANDOM Performed at [facility], [address], [place], [place] [address]    Special Requests   Final    NONE Performed at [facility], [address], [place], [place] [address]    Culture (A)  Final    40,000 COLONIES/mL ESCHERICHIA COLI SUSCEPTIBILITIES TO FOLLOW Performed at [facility], [address] [place], [place] [address]    Report Status PENDING  Incomplete  Culture, Respiratory w Gram Stain     Status: None (Preliminary result)   Collection Time: 05/12/2021  4:47 PM   Specimen: INDUCED SPUTUM  Result Value Ref Range Status   Specimen Description   Final    INDUCED SPUTUM Performed at [facility], [address], [place], [place] [address]    Special Requests   Final    NONE Performed at [facility], [address] [place], [place] [address]    Gram Stain   Final    FEW WBC PRESENT,BOTH PMN AND MONONUCLEAR NO ORGANISMS SEEN    Culture   Final    RARE Normal respiratory flora-no Staph aureus or Pseudomonas seen Performed at [facility] [address], [place], [place] [address]    Report Status PENDING  Incomplete  MRSA Next Gen by PCR, Nasal     Status: None   Collection Time: 05/13/2021  6:43 PM   Specimen: Nasal Mucosa; Nasal Swab  Result Value Ref Range Status   MRSA by PCR Next Gen NOT DETECTED NOT DETECTED Final    Comment: (NOTE) The GeneXpert MRSA Assay (FDA approved for NASAL specimens only), is one component of a comprehensive MRSA colonization surveillance program. It is not intended to diagnose MRSA infection nor to guide or monitor treatment for MRSA infections. Test performance is not FDA approved in patients less than 88 years old. Performed at [facility], [address] [place], [place] [address]            Indwelling Urinary Catheter continued, requirement due to   Reason to continue Indwelling Urinary Catheter strict Intake/Output monitoring for hemodynamic instability         Ventilator continued, requirement due to severe respiratory failure   Ventilator Sedation RASS 0 to -2      ASSESSMENT AND PLAN SYNOPSIS  RESPIRATORY Severe ACUTE Hypoxic and Hypercapnic Respiratory Failure: ILD, acute exacerbation and pneumonia Hemoptysis/bronchiectasis    Severe ACUTE Hypoxic and Hypercapnic Respiratory Failure -continue Mechanical Ventilator support -continue Bronchodilator Therapy -Wean Fio2 and PEEP as tolerated -VAP/VENT bundle implementation Vent Mode: PRVC FiO2 (%):  [60 %-80 %] 60 % Set Rate:  [26 bmp] 26 bmp Vt Set:  [320 mL] 320 mL PEEP:  [2 cmH20-5 cmH20] 5 cmH20 Plateau Pressure:  [28 cmH20-30 cmH20] 28 [id number]    ACUTE SYSTOLIC CARDIAC FAILURE- EF 30% -oxygen as needed -Lasix as tolerated -follow up cardiac enzymes as indicated -follow up cardiology recs  ACUTE KIDNEY INJURY/Renal Failure -continue Foley Catheter-assess need -Avoid nephrotoxic agents -Follow urine output, BMP -Ensure adequate renal perfusion, optimize oxygenation -Renal dose medications  INFECTIOUS DISEASE -continue antibiotics as prescribed -follow up cultures   NEUROLOGY ACUTE TOXIC METABOLIC ENCEPHALOPATHY -need for sedation -Goal RASS -2 to -3   GI GI PROPHYLAXIS as indicated  NUTRITIONAL STATUS DIET-->TF's as tolerated Constipation protocol as indicated    ELECTROLYTES -follow labs as needed -replace as needed -pharmacy consultation and following    DVT/GI PRX  assessed I Assessed the need for Labs I Assessed the need for Foley I Assessed the need for Central Venous Line Family Discussion when available I Assessed the need for Mobilization I made an Assessment of medications to be adjusted accordingly Safety Risk assessment completed  CASE DISCUSSED IN  MULTIDISCIPLINARY ROUNDS WITH ICU TEAM     Critical Care Time devoted to patient care services described in this note is 50 minutes.  Critical care was necessary to treat /prevent imminent and life-threatening deterioration. Overall, patient is critically ill, prognosis is guarded.  Patient with Multiorgan failure and at high risk for cardiac arrest and death.    Corrin Parker, M.D.  [facility]  Medical Director [place] Director [facility] Cardio-Pulmonary Department

## 2021-05-31 NOTE — Consult Note (Signed)
PHARMACY CONSULT NOTE - FOLLOW UP  Pharmacy Consult for Electrolyte Monitoring and Replacement   Recent Labs: Potassium (mmol/L)  Date Value  05/31/2021 3.9   Magnesium (mg/dL)  Date Value  05/31/2021 2.5 (H)   Calcium (mg/dL)  Date Value  05/31/2021 7.2 (L)   Albumin (g/dL)  Date Value  06/02/2021 3.3 (L)   Phosphorus (mg/dL)  Date Value  05/29/2021 7.9 (H)   Sodium (mmol/L)  Date Value  05/31/2021 145     Assessment: Pharmacy has been consulted to monitor and replace electrolytes in 80yo patient who presented to the ED with chest pain. Patient required intubation for acute respiratory failure. She is now requiring vasopressors for blood pressure support.  Pt remains on vent, has multiorgan failure, severe hypoxia, CHF, ILD and progressive renal failure. Not very responsive per rounding team  Corrected Calcium: 7.2 + (0.8 x [4 - 3.3]) = 7.76   Renal functioning is worsening.     Goal of Therapy:  Magnesium: 2.0 - 2.4 mg/dL  Potassium 4.0 - 5.1 mmol/L  All other electrolytes WNL  Plan:   Potassium is borderline at goal. Due to worsening renal function, defer replacement today and recheck in the morning.  Follow up electrolytes in the morning  [place] Ginny Loomer

## 2021-05-31 NOTE — Procedures (Signed)
Arterial Catheter Insertion Procedure Note  Barbara [name]  [id number]  [birth year]  Date:05/31/21  Time:8:36 PM    Provider Performing: Bradly Bienenstock    Procedure: Insertion of Arterial Line [phone]) with US guidance ([address]283)   Indication(s) Blood pressure monitoring and/or need for frequent ABGs  Consent Unable to obtain consent due to emergent nature of procedure.  Anesthesia None   Time Out Verified patient identification, verified procedure, site/side was marked, verified correct patient position, special equipment/implants available, medications/allergies/relevant history reviewed, required imaging and test results available.   Sterile Technique Maximal sterile technique including full sterile barrier drape, hand hygiene, sterile gown, sterile gloves, mask, hair covering, sterile ultrasound probe cover (if used).   Procedure Description Area of catheter insertion was cleaned with chlorhexidine and draped in sterile fashion. With real-time ultrasound guidance an arterial catheter was placed into the left femoral artery.  Appropriate arterial tracings confirmed on monitor.     Complications/Tolerance None; patient tolerated the procedure well.   EBL Minimal   Specimen(s) None  BIOPATCH applied to the insertion site.   Darel Hong, AGACNP-BC [facility] Prefer epic messenger for cross cover needs If after hours, please call E-link

## 2021-05-31 NOTE — Progress Notes (Signed)
Progress Note  Patient Name: Barbara [name] Date of Encounter: 05/31/2021  Primary Cardiologist: New to [facility] - consult by End  Subjective   She remains intubated, sedated, and continues to require multiple pressors. Renal function and HGB worse. BP remains soft in the 37S systolic. Family has not consented to HD to date.   Inpatient Medications    Scheduled Meds:  aspirin  324 mg Oral Once   chlorhexidine gluconate (MEDLINE KIT)  15 mL Mouth Rinse BID   Chlorhexidine Gluconate Cloth  6 each Topical Daily   insulin aspart  0-9 Units Subcutaneous Q4H   levalbuterol  0.63 mg Nebulization Q6H   mouth rinse  15 mL Mouth Rinse 10 times per day   methylPREDNISolone (SOLU-MEDROL) injection  60 mg Intravenous Q6H   pantoprazole (PROTONIX) IV  40 mg Intravenous QHS   sodium chloride flush  10-40 mL Intracatheter Q12H   Continuous Infusions:  sodium chloride 5 mL/hr at 05/31/21 0600   cefTRIAXone (ROCEPHIN)  IV Stopped (05/31/21 0402)   norepinephrine (LEVOPHED) Adult infusion 30 mcg/min (05/31/21 0600)   phenylephrine (NEO-SYNEPHRINE) Adult infusion 340 mcg/min (05/31/21 0600)   propofol (DIPRIVAN) infusion 20 mcg/kg/min (05/31/21 0600)   vasopressin 0.03 Units/min (05/31/21 0600)   PRN Meds: sodium chloride, docusate sodium, fentaNYL (SUBLIMAZE) injection, midazolam, polyethylene glycol, sodium chloride flush, vecuronium   Vital Signs    Vitals:   05/31/21 0600 05/31/21 0615 05/31/21 0630 05/31/21 0645  BP: (!) 82/61 (!) 77/62 (!) 79/60 (!) 81/59  Pulse: (!) 141     Resp: (!) 30     Temp: 100.04 F (37.8 C) 99.86 F (37.7 C) 100.22 F (37.9 C)   TempSrc:      SpO2: 92%     Weight:      Height:        Intake/Output Summary (Last 24 hours) at 05/31/2021 0743 Last data filed at 05/31/2021 0600 Gross per 24 hour  Intake 2421.94 ml  Output 310 ml  Net 2111.94 ml   Filed Weights   05/21/2021 1840 05/29/21 0413 05/31/21 0415  Weight: 57.7 kg 58.9 kg 59.1 kg     Telemetry    Sinus tachycardia with episode of atrial tach/SVT - Personally Reviewed  ECG    No new tracings - Personally Reviewed  Physical Exam   GEN: Critically ill appearing.  Neck: JVD unable to be assess secondary to respiratory support status. Cardiac: Tachycardic, no murmurs, rubs, or gallops.  Respiratory: Diminished and vented breath sounds bilaterally.  GI: Soft, nontender, non-distended.   MS: No edema; No deformity. Neuro:  Intubated and sedated. Psych: Intubated and sedated.   Labs    Chemistry Recent Labs  Lab 05/13/2021 1212 05/29/21 0353 05/29/21 1245 05/30/21 0504 05/31/21 0431  NA 130* 136  --  144 145  K 3.6 3.1* 3.6 3.6 3.9  CL 95* 99  --  105 109  CO2 21* 26  --  26 23  GLUCOSE 207* 289*  --  217* 184*  BUN 40* 48*  --  66* 88*  CREATININE 1.82* 1.91*  --  2.01* 2.62*  CALCIUM 8.8* 7.7*  --  7.5* 7.2*  PROT 7.2  --   --   --   --   ALBUMIN 3.3*  --   --   --   --   AST 137*  --   --   --   --   ALT 25  --   --   --   --  ALKPHOS 63  --   --   --   --   BILITOT 0.9  --   --   --   --   GFRNONAA 28* 26*  --  25* 18*  ANIONGAP 14 11  --  13 13     Hematology Recent Labs  Lab 05/07/2021 1212 05/16/2021 2017 05/30/21 0504 05/31/21 0431  WBC 15.0*  --  19.0* 18.5*  RBC 3.35*  --  2.86* 2.72*  HGB 9.6* 8.9* 8.1* 7.8*  HCT 28.2* 25.6* 24.5* 24.2*  MCV 84.2  --  85.7 89.0  MCH 28.7  --  28.3 28.7  MCHC 34.0  --  33.1 32.2  RDW 14.1  --  14.7 15.0  PLT 301  --  370 381    Cardiac EnzymesNo results for input(s): TROPONINI in the last 168 hours. No results for input(s): TROPIPOC in the last 168 hours.   BNP Recent Labs  Lab 05/24/2021 1212  BNP 1,699.7*     DDimer No results for input(s): DDIMER in the last 168 hours.   Radiology    DG Abd Portable 1V  Result Date: 05/29/2021 IMPRESSION: NG tube placement as above. The side port and distal tip are in the region of the stomach, below the GE junction. Electronically Signed   By:  Dorise Bullion III M.D   On: 05/29/2021 10:42    Cardiac Studies   2D echo 05/05/2021:  1. Left ventricular ejection fraction, by estimation, is 55 to 60%. The  left ventricle has normal function. The left ventricle has no regional  wall motion abnormalities. Left ventricular diastolic parameters were  normal.   2. Right ventricular systolic function is normal. The right ventricular  size is normal.   3. The mitral valve is normal in structure. No evidence of mitral valve  regurgitation.   4. The aortic valve is grossly normal. Aortic valve regurgitation is not  visualized.  __________  Limited echo 05/08/2021: 1. Left ventricular ejection fraction, by estimation, is 30 to 35%. The  left ventricle has moderately decreased function. The left ventricle  demonstrates regional wall motion abnormalities (see scoring  diagram/findings for description). There is mild  left ventricular hypertrophy. There is severe hypokinesis of the left  ventricular, mid-apical anterior wall, anterolateral wall, anteroseptal  wall, apical segment and inferior wall. Findings are most consistent with  large LAD territory artifact, though  stress-induced cardiomyopathy could also have this appearance.   2. Left atrial size was mildly dilated.   3. The mitral valve is normal in structure. Mild mitral valve  regurgitation.   4. Tricuspid valve regurgitation is mild to moderate.   5. The aortic valve is tricuspid. There is mild thickening of the aortic  valve.   Comparison(s): LVEF has decreased significantly since 05/05/2021.   Patient Profile     80 y.o. female with history of  ILD, DM2, HTN, HLD, hypothyroidism, anxiety, and cognitive impairment who is being seen today for the evaluation of NSTEMI complicated by cardiogenic shock with acute HFrEF   Assessment & Plan    1. Acute systolic CHF secondary to presumed ICM: -Hypotensive requiring multi pressor support -Unable to add GDMT in this  setting -Supportive care  2. NSTEMI: -Initial and peak troponin 16387 -Heparin gtt stopped due to hemoptysis and down trending HGB -Not currently a cath candidate given acutely ill with multiorgan failure and anemia -LHC if clinical status improves -No ASA with hemoptysis and down trending HGB  3. Acute on chronic  hypoxic respiratory failure with ILD: -Intubated and sedated -Per CCM  4. Multiorgan failure: -Overall, poor prognosis -Consider Palliative care consult  For questions or updates, please contact [place] Please consult [email or web address] for contact info under Cardiology/STEMI.    Signed, Christell Faith, PA-C [place] Pager: [phone] 05/31/2021, 7:43 AM

## 2021-06-01 ENCOUNTER — Inpatient Hospital Stay: Payer: Medicare HMO

## 2021-06-01 LAB — BASIC METABOLIC PANEL
Anion gap: 11 (ref 5–15)
BUN: 108 mg/dL — ABNORMAL HIGH (ref 8–23)
CO2: 21 mmol/L — ABNORMAL LOW (ref 22–32)
Calcium: 6.7 mg/dL — ABNORMAL LOW (ref 8.9–10.3)
Chloride: 112 mmol/L — ABNORMAL HIGH (ref 98–111)
Creatinine, Ser: 4.18 mg/dL — ABNORMAL HIGH (ref 0.44–1.00)
GFR, Estimated: 10 mL/min — ABNORMAL LOW (ref >60)
Glucose, Bld: 180 mg/dL — ABNORMAL HIGH (ref 70–99)
Potassium: 4.6 mmol/L (ref 3.5–5.1)
Sodium: 144 mmol/L (ref 135–145)

## 2021-06-01 LAB — BLOOD GAS, ARTERIAL
Acid-base deficit: 12 mmol/L — ABNORMAL HIGH (ref 0.0–2.0)
Bicarbonate: 17.5 mmol/L — ABNORMAL LOW (ref 20.0–28.0)
FIO2: 0.7
MECHVT: 320 mL
O2 Saturation: 98.7 %
PEEP: 10 cmH2O
Patient temperature: 37
RATE: 30 resp/min
pCO2 arterial: 59 mmHg — ABNORMAL HIGH (ref 32.0–48.0)
pH, Arterial: 7.08 — CL (ref 7.350–7.450)
pO2, Arterial: 159 mmHg — ABNORMAL HIGH (ref 83.0–108.0)

## 2021-06-01 LAB — CBC
HCT: 22.5 % — ABNORMAL LOW (ref 36.0–46.0)
Hemoglobin: 7.1 g/dL — ABNORMAL LOW (ref 12.0–15.0)
MCH: 28.7 pg (ref 26.0–34.0)
MCHC: 31.6 g/dL (ref 30.0–36.0)
MCV: 91.1 fL (ref 80.0–100.0)
Platelets: 369 10*3/uL (ref 150–400)
RBC: 2.47 MIL/uL — ABNORMAL LOW (ref 3.87–5.11)
RDW: 15.3 % (ref 11.5–15.5)
WBC: 21.8 10*3/uL — ABNORMAL HIGH (ref 4.0–10.5)
nRBC: 1.9 % — ABNORMAL HIGH (ref 0.0–0.2)

## 2021-06-01 LAB — MAGNESIUM: Magnesium: 2.6 mg/dL — ABNORMAL HIGH (ref 1.7–2.4)

## 2021-06-01 LAB — PHOSPHORUS: Phosphorus: 7.3 mg/dL — ABNORMAL HIGH (ref 2.5–4.6)

## 2021-06-01 LAB — ABO/RH: ABO/RH(D): O POS

## 2021-06-01 LAB — GLUCOSE, CAPILLARY
Glucose-Capillary: 160 mg/dL — ABNORMAL HIGH (ref 70–99)
Glucose-Capillary: 148 mg/dL — ABNORMAL HIGH (ref 70–99)

## 2021-06-01 MED ORDER — STERILE WATER FOR INJECTION IV SOLN
INTRAVENOUS | Status: DC
  Filled 2021-06-01 (×3): qty 1000

## 2021-06-01 MED ORDER — GLYCOPYRROLATE 0.2 MG/ML IJ SOLN: 0.2000 mg | INTRAMUSCULAR | Status: DC | PRN

## 2021-06-01 MED ORDER — MORPHINE SULFATE (PF) 2 MG/ML IV SOLN: 2.0000 mg | INTRAVENOUS | Status: DC | PRN

## 2021-06-01 MED ORDER — DIPHENHYDRAMINE HCL 50 MG/ML IJ SOLN: 25.0000 mg | INTRAMUSCULAR | Status: DC | PRN

## 2021-06-01 MED ORDER — ACETAMINOPHEN 325 MG PO TABS: 650.0000 mg | ORAL_TABLET | Freq: Four times a day (QID) | ORAL | Status: DC | PRN

## 2021-06-01 MED ORDER — POLYVINYL ALCOHOL 1.4 % OP SOLN
1.0000 [drp] | Freq: Four times a day (QID) | OPHTHALMIC | Status: DC | PRN
  Filled 2021-06-01: qty 15

## 2021-06-01 MED ORDER — FENTANYL 2500MCG IN NS 250ML (10MCG/ML) PREMIX INFUSION: 0.0000 ug/h | INTRAVENOUS | Status: DC

## 2021-06-01 MED ORDER — ACETAMINOPHEN 650 MG RE SUPP: 650.0000 mg | Freq: Four times a day (QID) | RECTAL | Status: DC | PRN

## 2021-06-01 MED ORDER — DEXTROSE 5 % IV SOLN: INTRAVENOUS | Status: DC

## 2021-06-01 MED ORDER — SODIUM CHLORIDE 0.9% IV SOLUTION: Freq: Once | INTRAVENOUS | Status: DC

## 2021-06-01 MED ORDER — GLYCOPYRROLATE 1 MG PO TABS
1.0000 mg | ORAL_TABLET | ORAL | Status: DC | PRN
  Filled 2021-06-01: qty 1

## 2021-06-01 MED ORDER — SODIUM CHLORIDE 0.9 % IV SOLN
0.0000 ug/min | INTRAVENOUS | Status: DC
  Administered 2021-06-01: 400 ug/min via INTRAVENOUS
  Filled 2021-06-01 (×2): qty 10

## 2021-06-01 MED ORDER — MORPHINE BOLUS VIA INFUSION
5.0000 mg | INTRAVENOUS | Status: DC | PRN
  Filled 2021-06-01: qty 5

## 2021-06-01 MED ORDER — SODIUM BICARBONATE 8.4 % IV SOLN
100.0000 meq | Freq: Once | INTRAVENOUS | Status: AC
  Administered 2021-06-01: 100 meq via INTRAVENOUS
  Filled 2021-06-01: qty 100

## 2021-06-01 MED ORDER — FENTANYL 2500MCG IN NS 250ML (10MCG/ML) PREMIX INFUSION
INTRAVENOUS | Status: AC
  Administered 2021-06-01: 25 ug/h via INTRAVENOUS
  Filled 2021-06-01: qty 250

## 2021-06-01 MED ORDER — MIDAZOLAM HCL 2 MG/2ML IJ SOLN: 2.0000 mg | INTRAMUSCULAR | Status: DC | PRN

## 2021-06-01 MED ORDER — MORPHINE 100MG IN NS 100ML (1MG/ML) PREMIX INFUSION
0.0000 mg/h | INTRAVENOUS | Status: DC
  Administered 2021-06-01: 5 mg/h via INTRAVENOUS
  Filled 2021-06-01: qty 100

## 2021-06-03 LAB — CULTURE, BLOOD (ROUTINE X 2): Special Requests: ADEQUATE

## 2021-06-03 LAB — TYPE AND SCREEN
ABO/RH(D): O POS
Antibody Screen: NEGATIVE
Unit division: 0

## 2021-06-03 LAB — BPAM RBC
Blood Product Expiration Date: 202208052359
Unit Type and Rh: 5100

## 2021-06-03 LAB — PREPARE RBC (CROSSMATCH)

## 2021-06-04 ENCOUNTER — Telehealth: Payer: Self-pay | Admitting: Family Medicine

## 2021-06-04 NOTE — Progress Notes (Signed)
  Chaplain On-Call responded to a call from [facility] with report of impending extubation of the patient, and family is at the bedside.  Chaplain received medical update from RN Judson Roch, and met patient's sisters Dedra Skeens and Kalman Shan.  Chaplain provided prayer and spiritual and emotional support prior to extubation. The patient died soon after the extubation.  Chaplain continued to support Alma and Rose with prayer, life review and storytelling.  Chaplain escorted them to the Main Entrance for their departure.  Chaplain Pollyann Samples M.Div., [facility]

## 2021-06-04 NOTE — Progress Notes (Signed)
GOALS OF CARE DISCUSSION  The Clinical status was relayed to family in detail. Both sisters and best friend are at bedside. State that no one else will be coming  Updated and notified of patients medical condition.    Patient remains unresponsive and will not open eyes to command.   Patient is having a weak cough and struggling to remove secretions.   Patient with increased WOB and using accessory muscles to breathe Explained to family course of therapy and the modalities    Patient with Progressive multiorgan failure with a very high probablity of a very minimal chance of meaningful recovery despite all aggressive and optimal medical therapy.  Patient is suffering and active dying process  Family understands the situation. They have consented and agreed to DNR/DNI and would like to proceed with Comfort care measures.  Family are satisfied with Plan of action and management. All questions answered  Additional CC time 35 mins   Angila Wombles Patricia Pesa, M.D.  [facility]  Medical Director [place] Director [facility] Cardio-Pulmonary Department

## 2021-06-04 NOTE — Death Summary Note (Signed)
DEATH SUMMARY   Patient Details  Name: Barbara [name] MRN: [id number] DOB: 1941-11-18  Admission/Discharge Information   Admit Date:  June 22, 2021  Date of Death: Date of Death: 06-26-21  Time of Death: Time of Death: 49  Length of Stay: 4  Referring Physician: Tower, Wynelle Fanny, MD     Diagnoses  Preliminary cause of death: ISCHEMIC CARDIOMYOPATHY, ILD, PNEUNMONIA Secondary Diagnoses (including complications and co-morbidities):  Active Problems:   Encounter for nasogastric (NG) tube placement   Acute hypoxemic respiratory failure (HCC)   Hemoptysis   NSTEMI (non-ST elevated myocardial infarction) ([place])   Acute systolic congestive heart failure ([place])   Encounter for central line placement   Brief Hospital Course (including significant findings, care, treatment, and services provided and events leading to death)   80 yo WF never smoker recently admitted for hypoxic respiratory failure in May, where she was found to have stigmata of interstitial lung disease and concurrent pulmonary edema, now presents with complaints of chest pain There was a concurrent cough productive of some purulent sputum over the past several days. CXR revealed bilateral interstitial edema. A troponin returned in excess of 16k, and cards saw her in the ED with a prelim echo reporting new reduction in EF 30-35%. She failed NIV  for accruing oxygen need, and right before intubation developed some hemoptysis (heparin was initiated for ACS but stopped). With intubation she required a levophed drip.     INITERVAL HISTORY Multiorgan failure Severe hypoxia Severe s CHF Severe ILD Progressive renal failure Significant Hospital Events: Including procedures, antibiotic start and stop dates in addition to other pertinent events     06/23/2023 admitted to ICU 6/26 remains on vent 6/27 remains on vent 06/27/23 remains on vent, severe multiorgan failure     Interim History / Subjective:  Patient with active dying  process Multiorgan failure    GOALS OF CARE DISCUSSION  The Clinical status was relayed to family in detail.  Updated and notified of patients medical condition.    Patient remains unresponsive and will not open eyes to command.   Patient is having a weak cough and struggling to remove secretions.   Patient with increased WOB and using accessory muscles to breathe Explained to family course of therapy and the modalities    Patient with Progressive multiorgan failure with a very high probablity of a very minimal chance of meaningful recovery despite all aggressive and optimal medical therapy.   They have consented and agreed to DNR/DNI and would like to proceed with Comfort care measures.  Family are satisfied with Plan of action and management. All questions answered     Pertinent Labs and Studies  Significant Diagnostic Studies DG Chest 1 View  Result Date: 05/29/2021 CLINICAL DATA:  Pneumothorax EXAM: CHEST  1 VIEW COMPARISON:  06/22/2021 FINDINGS: Endotracheal tube seen 4.8 cm above the carina. Nasogastric tube extends into the upper abdomen beyond the margin of the examination. Left internal jugular central venous catheter tip noted within the superior vena cava. The lungs are symmetrically well expanded, stable since prior examination. Extensive, diffuse airspace infiltrate is again seen throughout the lungs, stable since prior examination, in keeping with diffuse pulmonary hemorrhage, pulmonary edema, or inflammatory infiltrate. No pneumothorax or pleural effusion. Cardiac size within normal limits. No acute bone abnormality. IMPRESSION: Support lines and tubes in appropriate position. No pneumothorax. Stable diffuse, extensive airspace infiltrate. Electronically Signed   By: Fidela Salisbury MD   On: 05/29/2021 00:56   DG Chest 2 View  Result Date: 05/04/2021 CLINICAL DATA:  Dyspnea for few weeks EXAM: CHEST - 2 VIEW COMPARISON:  None. FINDINGS: Normal heart size. Normal  mediastinal contour. No pneumothorax. No pleural effusion. Patchy hazy and reticular opacities throughout both lungs with a basilar predominance. Suggestion of tram track opacities in the left greater than right lower lobes. Lower thoracic vertebral compression fracture of indeterminate chronicity. IMPRESSION: Patchy hazy and reticular opacities throughout both lungs with a basilar predominance, with suggestion of tram track opacities in the left greater than right lower lobes suggestive of bronchiectasis. Findings are suggestive of interstitial lung disease. Consider dedicated high-resolution chest CT for further evaluation. Electronically Signed   By: Ilona Sorrel M.D.   On: 05/04/2021 16:06   US RENAL  Result Date: 05/07/2021 CLINICAL DATA:  Chronic kidney disease stage 3 B. EXAM: RENAL / URINARY TRACT ULTRASOUND COMPLETE COMPARISON:  None. FINDINGS: Right Kidney: Renal measurements: 10.0 x 5.4 x 5.3 cm = volume: 149 mL. Mildly increased echogenicity of renal parenchyma is noted. No mass or hydronephrosis visualized. Left Kidney: Renal measurements: 10.7 x 6.0 x 5.7 cm = volume: 192 mL. Mildly increased echogenicity of renal parenchyma is noted. No mass or hydronephrosis visualized. Bladder: Appears normal for degree of bladder distention. Other: None. IMPRESSION: Mildly increased echogenicity of renal parenchyma is noted suggesting medical renal disease. No hydronephrosis or renal obstruction is noted. Electronically Signed   By: Marijo Conception M.D.   On: 05/07/2021 13:50   CT Chest High Resolution  Result Date: 05/04/2021 CLINICAL DATA:  Abnormal chest radiograph, dyspnea EXAM: CT CHEST WITHOUT CONTRAST TECHNIQUE: Multidetector CT imaging of the chest was performed following the standard protocol without intravenous contrast. High resolution imaging of the lungs, as well as inspiratory and expiratory imaging, was performed. COMPARISON:  Chest radiograph from earlier today. FINDINGS: Motion degraded  scan, limiting assessment. Cardiovascular: Mild cardiomegaly. No significant pericardial effusion/thickening. Three-vessel coronary atherosclerosis. Atherosclerotic nonaneurysmal thoracic aorta. Dilated main pulmonary artery (3.8 cm diameter). Mediastinum/Nodes: No discrete thyroid nodules. Unremarkable esophagus. No axillary adenopathy. Mildly enlarged 1.5 cm right paratracheal node (series 2/image 50). Mildly enlarged 1.3 cm subcarinal node (series 2/image 63). Mildly enlarged 1.0 cm left prevascular node (series 2/image 51). No discrete hilar adenopathy on these noncontrast images. Lungs/Pleura: No pneumothorax. No pleural effusion. No acute consolidative airspace disease, lung masses or significant pulmonary nodules. Subcentimeter calcified basilar left lower lobe granuloma. Widespread patchy ground-glass opacities throughout both lungs. Patchy subpleural reticulation and mild interlobular septal thickening with associated mild traction bronchiectasis and architectural distortion, most prominent in the lower lungs. Scattered mild honeycombing in the dependent lower lobes bilaterally, right greater than left, poorly evaluated due to motion degradation. No significant lobular air trapping or evidence of tracheobronchomalacia on the expiration sequence. Upper abdomen: Small hiatal hernia.  Cholecystectomy. Musculoskeletal: No aggressive appearing focal osseous lesions. Mild T4 vertebral compression fracture and severe T10 vertebral compression fracture of indeterminate chronicity, chronic appearing. Mild thoracic spondylosis. IMPRESSION: 1. Motion degraded scan, limiting assessment. 2. Mild cardiomegaly. 3. Patchy ground-glass opacity throughout both lungs. Mild traction bronchiectasis, architectural distortion and mild honeycombing, basilar predominant. Findings are suggestive of cardiogenic pulmonary edema superimposed on chronic basilar predominant fibrotic interstitial lung disease, potentially underlying UIP.  Suggest attention on follow-up high-resolution chest CT study in 3-6 months. 4. Three-vessel coronary atherosclerosis. 5. Dilated main pulmonary artery, suggesting pulmonary arterial hypertension. 6. Mild mediastinal lymphadenopathy, nonspecific, probably reactive. 7. Small hiatal hernia. 8. Chronic appearing T4 and T10 vertebral compression fractures. 9. Aortic Atherosclerosis (ICD10-I70.0). Electronically Signed  By: Ilona Sorrel M.D.   On: 05/04/2021 17:09   DG Chest Port 1 View  Result Date: 06-23-2021 CLINICAL DATA:  Acute respiratory failure, hypoxia EXAM: PORTABLE CHEST 1 VIEW COMPARISON:  05/29/2021 FINDINGS: Endotracheal tube seen 4.6 cm above the carina. Nasogastric tube extends into the left upper quadrant of the abdomen beyond the margin of the examination. Left internal jugular central venous catheter tip within the superior vena cava. Lung volumes are small and pulmonary insufflation has slightly decreased in the interval. Superimposed diffuse pulmonary infiltrate persists, infectious, inflammatory, or edema. No pneumothorax or pleural effusion. Cardiac size is mildly enlarged, unchanged. IMPRESSION: Stable support lines and tubes. Decreased pulmonary insufflation. Stable diffuse pulmonary infiltrate Stable cardiomegaly. Electronically Signed   By: Fidela Salisbury MD   On: 06-23-2021 04:34   DG Chest Port 1 View  Result Date: 05/27/2021 CLINICAL DATA:  Encounter for central line placement. EXAM: PORTABLE CHEST 1 VIEW COMPARISON:  May 28, 2021. FINDINGS: LEFT IJ central line placed since the previous study terminates in the area of the caval to atrial junction. Endotracheal tube terminates in the mid trachea approximately 3.9 cm from the carina. EKG leads project over the chest. Gastric tube courses through in off the field of the radiograph. Pacer defibrillator pads project over the LEFT chest and upper abdomen. Stable appearance of the chest with diffuse interstitial and airspace opacities.  Cardiomediastinal contours remain obscured. On limited assessment no acute skeletal process. IMPRESSION: 1. LEFT IJ central line placed since the previous study, terminating in the area of the caval to atrial junction. 2. Otherwise no change in the appearance of the chest with diffuse interstitial and airspace opacities in this intubated patient. 3. Interval placement of a gastric tube, tip off the field of view. Electronically Signed   By: Zetta Bills M.D.   On: 05/20/2021 16:48   DG Chest Portable 1 View  Result Date: 05/05/2021 CLINICAL DATA:  Status post intubation. EXAM: PORTABLE CHEST 1 VIEW COMPARISON:  Earlier today. FINDINGS: Interval endotracheal tube in satisfactory position. Stable enlarged cardiac silhouette. Increased diffuse bilateral airspace opacity, more confluent on the right. No visible pleural fluid. Thoracic spine degenerative changes. IMPRESSION: 1. Endotracheal tube in satisfactory position. 2. Increased extensive bilateral pneumonia or alveolar edema. Electronically Signed   By: Claudie Revering M.D.   On: 06/03/2021 16:03   DG Chest Portable 1 View  Result Date: 06/03/2021 CLINICAL DATA:  Shortness of breath and productive cough. EXAM: PORTABLE CHEST 1 VIEW COMPARISON:  Chest x-ray dated May 07, 2021. FINDINGS: Unchanged mild cardiomegaly. Worsened diffuse interstitial and hazy airspace opacities in both lungs. No pneumothorax or large pleural effusion. No acute osseous abnormality. IMPRESSION: Worsened diffuse interstitial and hazy airspace opacities in both lungs concerning for pulmonary edema or multifocal pneumonia superimposed on chronic interstitial lung disease for. Electronically Signed   By: Titus Dubin M.D.   On: 05/18/2021 12:48   DG Chest Port 1 View  Result Date: 05/07/2021 CLINICAL DATA:  Respiratory failure. EXAM: PORTABLE CHEST 1 VIEW COMPARISON:  CT chest and chest x-ray dated May 04, 2021. FINDINGS: Unchanged mild cardiomegaly. Similar appearing diffuse  interstitial and hazy airspace opacities in both lungs. No pneumothorax or large pleural effusion. IMPRESSION: 1. Similar appearing chronic interstitial lung disease with probable superimposed pulmonary edema. Electronically Signed   By: Titus Dubin M.D.   On: 05/07/2021 08:29   DG Abd Portable 1V  Result Date: 05/29/2021 CLINICAL DATA:  Evaluate NG tube placement EXAM: PORTABLE ABDOMEN - 1  VIEW COMPARISON:  None. FINDINGS: The NG tube terminates in the left upper quadrant with the side port and distal tip in the region of the stomach. IMPRESSION: NG tube placement as above. The side port and distal tip are in the region of the stomach, below the GE junction. Electronically Signed   By: Dorise Bullion III M.D   On: 05/29/2021 10:42   ECHOCARDIOGRAM COMPLETE  Result Date: 05/06/2021    ECHOCARDIOGRAM REPORT   Patient Name:   [facility] Date of Exam: 05/05/2021 Medical Rec #:  [id number]           Height:       62.5 in Accession #:    [id number]          Weight:       130.0 lb Date of Birth:  24-Nov-1941           BSA:          1.601 m Patient Age:    49 years            BP:           113/75 mmHg Patient Gender: F                   HR:           108 bpm. Exam Location:  [facility] Procedure: 2D Echo, Color Doppler and Cardiac Doppler Indications:     I50.9 Congestive Heart Failure  History:         Patient has no prior history of Echocardiogram examinations.                  Risk Factors:Hypertension, Diabetes and Dyslipidemia.  Sonographer:     Charmayne Sheer RDCS (AE) Referring Phys:  [id number] Cleaster Corin PATEL Diagnosing Phys: Yolonda Kida MD  Sonographer Comments: Suboptimal subcostal window. IMPRESSIONS  1. Left ventricular ejection fraction, by estimation, is 55 to 60%. The left ventricle has normal function. The left ventricle has no regional wall motion abnormalities. Left ventricular diastolic parameters were normal.  2. Right ventricular systolic function is normal. The right ventricular size is  normal.  3. The mitral valve is normal in structure. No evidence of mitral valve regurgitation.  4. The aortic valve is grossly normal. Aortic valve regurgitation is not visualized. FINDINGS  Left Ventricle: Left ventricular ejection fraction, by estimation, is 55 to 60%. The left ventricle has normal function. The left ventricle has no regional wall motion abnormalities. The left ventricular internal cavity size was normal in size. There is  no left ventricular hypertrophy. Left ventricular diastolic parameters were normal. Right Ventricle: The right ventricular size is normal. No increase in right ventricular wall thickness. Right ventricular systolic function is normal. Left Atrium: Left atrial size was normal in size. Right Atrium: Right atrial size was normal in size. Pericardium: There is no evidence of pericardial effusion. Mitral Valve: The mitral valve is normal in structure. No evidence of mitral valve regurgitation. MV peak gradient, 5.7 mmHg. The mean mitral valve gradient is 2.0 mmHg. Tricuspid Valve: The tricuspid valve is normal in structure. Tricuspid valve regurgitation is not demonstrated. Aortic Valve: The aortic valve is grossly normal. Aortic valve regurgitation is not visualized. Aortic valve mean gradient measures 4.0 mmHg. Aortic valve peak gradient measures 7.4 mmHg. Aortic valve area, by VTI measures 1.86 cm. Pulmonic Valve: The pulmonic valve was normal in structure. Pulmonic valve regurgitation is not visualized. Aorta: The ascending aorta was not well visualized.  IAS/Shunts: No atrial level shunt detected by color flow Doppler.  LEFT VENTRICLE PLAX 2D LVIDd:         4.20 cm  Diastology LVIDs:         3.00 cm  LV e' medial:    3.92 cm/s LV PW:         1.00 cm  LV E/e' medial:  24.2 LV IVS:        0.80 cm  LV e' lateral:   5.66 cm/s LVOT diam:     1.80 cm  LV E/e' lateral: 16.7 LV SV:         37 LV SV Index:   23 LVOT Area:     2.54 cm  RIGHT VENTRICLE RV Basal diam:  3.30 cm LEFT ATRIUM              Index       RIGHT ATRIUM           Index LA diam:        4.00 cm 2.50 cm/m  RA Area:     11.90 cm LA Vol (A2C):   26.2 ml 16.36 ml/m RA Volume:   27.20 ml  16.99 ml/m LA Vol (A4C):   47.6 ml 29.72 ml/m LA Biplane Vol: 38.3 ml 23.92 ml/m  AORTIC VALVE                   PULMONIC VALVE AV Area (Vmax):    1.69 cm    PV Vmax:       0.95 m/s AV Area (Vmean):   1.77 cm    PV Vmean:      61.700 cm/s AV Area (VTI):     1.86 cm    PV VTI:        0.153 m AV Vmax:           136.00 cm/s PV Peak grad:  3.6 mmHg AV Vmean:          90.200 cm/s PV Mean grad:  2.0 mmHg AV VTI:            0.198 m AV Peak Grad:      7.4 mmHg AV Mean Grad:      4.0 mmHg LVOT Vmax:         90.10 cm/s LVOT Vmean:        62.600 cm/s LVOT VTI:          0.145 m LVOT/AV VTI ratio: 0.73  AORTA Ao Root diam: 2.90 cm MITRAL VALVE MV Area (PHT): 3.92 cm    SHUNTS MV Area VTI:   1.83 cm    Systemic VTI:  0.14 m MV Peak grad:  5.7 mmHg    Systemic Diam: 1.80 cm MV Mean grad:  2.0 mmHg MV Vmax:       1.19 m/s MV Vmean:      72.5 cm/s MV Decel Time: 193 msec MV E velocity: 94.70 cm/s Yolonda Kida MD Electronically signed by Yolonda Kida MD Signature Date/Time: 05/06/2021/9:16:05 AM    Final    ECHOCARDIOGRAM LIMITED  Result Date: 05/30/2021    ECHOCARDIOGRAM LIMITED REPORT   Patient Name:   LIVIANA MILLS Date of Exam: 05/06/2021 Medical Rec #:  [id number]           Height:       61.0 in Accession #:    [id number]          Weight:       102.0 lb Date  of Birth:  [birth year]           BSA:          1.419 m Patient Age:    75 years            BP:           84/58 mmHg Patient Gender: F                   HR:           108 bpm. Exam Location:  [facility] Procedure: Limited Echo, Color Doppler, Cardiac Doppler and Intracardiac            Opacification Agent Indications:     Elevated troponin  History:         Patient has prior history of Echocardiogram examinations, most                  recent 05/05/2021. Risk Factors:Hypertension and Diabetes.   Sonographer:     Sherrie Sport RDCS (AE) Referring Phys:  [id number] Rise Mu Diagnosing Phys: Nelva Bush MD  Sonographer Comments: Suboptimal apical window. IMPRESSIONS  1. Left ventricular ejection fraction, by estimation, is 30 to 35%. The left ventricle has moderately decreased function. The left ventricle demonstrates regional wall motion abnormalities (see scoring diagram/findings for description). There is mild left ventricular hypertrophy. There is severe hypokinesis of the left ventricular, mid-apical anterior wall, anterolateral wall, anteroseptal wall, apical segment and inferior wall. Findings are most consistent with large LAD territory artifact, though stress-induced cardiomyopathy could also have this appearance.  2. Left atrial size was mildly dilated.  3. The mitral valve is normal in structure. Mild mitral valve regurgitation.  4. Tricuspid valve regurgitation is mild to moderate.  5. The aortic valve is tricuspid. There is mild thickening of the aortic valve. Comparison(s): LVEF has decreased significantly since 05/05/2021. FINDINGS  Left Ventricle: No evidence apical thrombus. Left ventricular ejection fraction, by estimation, is 30 to 35%. The left ventricle has moderately decreased function. The left ventricle demonstrates regional wall motion abnormalities. Severe hypokinesis of  the left ventricular, mid-apical anterior wall, anterolateral wall, anteroseptal wall, apical segment and inferior wall. Definity contrast agent was given IV to delineate the left ventricular endocardial borders. The left ventricular internal cavity size was normal in size. There is mild left ventricular hypertrophy. Right Ventricle: No increase in right ventricular wall thickness. Left Atrium: Left atrial size was mildly dilated. Right Atrium: Right atrial size was normal in size. Mitral Valve: The mitral valve is normal in structure. Mild mitral valve regurgitation. Tricuspid Valve: The tricuspid valve is normal in  structure. Tricuspid valve regurgitation is mild to moderate. Aortic Valve: The aortic valve is tricuspid. There is mild thickening of the aortic valve. Pulmonic Valve: The pulmonic valve was grossly normal. Pulmonic valve regurgitation is not visualized. No evidence of pulmonic stenosis. Pulmonary Artery: The pulmonary artery is of normal size. LEFT VENTRICLE PLAX 2D LVIDd:         4.86 cm LVIDs:         3.80 cm LV PW:         0.96 cm LV IVS:        1.29 cm LVOT diam:     2.10 cm LVOT Area:     3.46 cm  LEFT ATRIUM           Index       RIGHT ATRIUM          Index LA  diam:      4.80 cm 3.38 cm/m  RA Area:     9.75 cm LA Vol (A4C): 59.2 ml 41.73 ml/m RA Volume:   18.10 ml 12.76 ml/m                        PULMONIC VALVE AORTA                 PV Vmax:        0.65 m/s Ao Root diam: 2.90 cm PV Peak grad:   1.7 mmHg                       RVOT Peak grad: 3 mmHg  TRICUSPID VALVE TR Peak grad:   59.9 mmHg TR Vmax:        387.00 cm/s  SHUNTS Systemic Diam: 2.10 cm Nelva Bush MD Electronically signed by Nelva Bush MD Signature Date/Time: 05/14/2021/5:45:41 PM    Final     Microbiology Recent Results (from the past 240 hour(s))  Blood culture (routine x 2)     Status: None (Preliminary result)   Collection Time: 05/20/2021 12:11 PM   Specimen: BLOOD  Result Value Ref Range Status   Specimen Description BLOOD LEFT ANTECUBITAL  Final   Special Requests   Final    BOTTLES DRAWN AEROBIC AND ANAEROBIC Blood Culture adequate volume   Culture  Setup Time   Final    GRAM NEGATIVE RODS ANAEROBIC BOTTLE ONLY CRITICAL RESULT CALLED TO, READ BACK BY AND VERIFIED WITH: NATHAN BLUE@0018  06/14/2021 RH Performed at [facility], [address], [place], [place] [address]    Culture GRAM NEGATIVE RODS  Final   Report Status PENDING  Incomplete  Resp Panel by RT-PCR (Flu A&B, Covid) Nasopharyngeal Swab     Status: None   Collection Time: 05/19/2021 12:12 PM   Specimen: Nasopharyngeal Swab;  Nasopharyngeal(NP) swabs in vial transport medium  Result Value Ref Range Status   SARS Coronavirus 2 by RT PCR NEGATIVE NEGATIVE Final    Comment: (NOTE) SARS-CoV-2 target nucleic acids are NOT DETECTED.  The SARS-CoV-2 RNA is generally detectable in upper respiratory specimens during the acute phase of infection. The lowest concentration of SARS-CoV-2 viral copies this assay can detect is 138 copies/mL. A negative result does not preclude SARS-Cov-2 infection and should not be used as the sole basis for treatment or other patient management decisions. A negative result may occur with  improper specimen collection/handling, submission of specimen other than nasopharyngeal swab, presence of viral mutation(s) within the areas targeted by this assay, and inadequate number of viral copies(<138 copies/mL). A negative result must be combined with clinical observations, patient history, and epidemiological information. The expected result is Negative.  Fact Sheet for Patients:  EntrepreneurPulse.com.au  Fact Sheet for Healthcare Providers:  IncredibleEmployment.be  This test is no t yet approved or cleared by the [place] FDA and  has been authorized for detection and/or diagnosis of SARS-CoV-2 by FDA under an Emergency Use Authorization (EUA). This EUA will remain  in effect (meaning this test can be used) for the duration of the COVID-19 declaration under Section 564(b)(1) of the Act, 21 U.S.C.section 360bbb-3(b)(1), unless the authorization is terminated  or revoked sooner.       Influenza A by PCR NEGATIVE NEGATIVE Final   Influenza B by PCR NEGATIVE NEGATIVE Final    Comment: (NOTE) The Xpert Xpress SARS-CoV-2/FLU/RSV plus assay is intended as an aid in  the diagnosis of influenza from Nasopharyngeal swab specimens and should not be used as a sole basis for treatment. Nasal washings and aspirates are unacceptable for Xpert Xpress  SARS-CoV-2/FLU/RSV testing.  Fact Sheet for Patients: EntrepreneurPulse.com.au  Fact Sheet for Healthcare Providers: IncredibleEmployment.be  This test is not yet approved or cleared by the [place] FDA and has been authorized for detection and/or diagnosis of SARS-CoV-2 by FDA under an Emergency Use Authorization (EUA). This EUA will remain in effect (meaning this test can be used) for the duration of the COVID-19 declaration under Section 564(b)(1) of the Act, 21 U.S.C. section 360bbb-3(b)(1), unless the authorization is terminated or revoked.  Performed at [facility], [address] [place], [place] [address]   Blood culture (routine x 2)     Status: Abnormal   Collection Time: 05/23/2021  1:50 PM   Specimen: BLOOD  Result Value Ref Range Status   Specimen Description   Final    BLOOD RIGHT ANTECUBITAL Performed at [facility], [address] [place], [place] [address]    Special Requests   Final    BOTTLES DRAWN AEROBIC AND ANAEROBIC Blood Culture results may not be optimal due to an inadequate volume of blood received in culture bottles Performed at [facility], [address] [place], [place] [address]    Culture  Setup Time   Final    GRAM NEGATIVE RODS IN BOTH AEROBIC AND ANAEROBIC BOTTLES CRITICAL RESULT CALLED TO, READ BACK BY AND VERIFIED WITHCorene Cornea ROBINS @ [phone] 05/29/21 LFD Performed at [facility], [place] [address], [place],  [address]    Culture ESCHERICHIA COLI (A)  Final   Report Status 05/31/2021 FINAL  Final   Organism ID, Bacteria ESCHERICHIA COLI  Final      Susceptibility   Escherichia coli - MIC*    AMPICILLIN <=2 SENSITIVE Sensitive     CEFAZOLIN <=4 SENSITIVE Sensitive     CEFEPIME <=0.12 SENSITIVE Sensitive     CEFTAZIDIME <=1 SENSITIVE Sensitive     CEFTRIAXONE <=0.25 SENSITIVE Sensitive     CIPROFLOXACIN <=0.25 SENSITIVE Sensitive     GENTAMICIN <=1  SENSITIVE Sensitive     IMIPENEM <=0.25 SENSITIVE Sensitive     TRIMETH/SULFA <=20 SENSITIVE Sensitive     AMPICILLIN/SULBACTAM <=2 SENSITIVE Sensitive     PIP/TAZO <=4 SENSITIVE Sensitive     * ESCHERICHIA COLI  Blood Culture ID Panel (Reflexed)     Status: Abnormal   Collection Time: 05/19/2021  1:50 PM  Result Value Ref Range Status   Enterococcus faecalis NOT DETECTED NOT DETECTED Final   Enterococcus Faecium NOT DETECTED NOT DETECTED Final   Listeria monocytogenes NOT DETECTED NOT DETECTED Final   Staphylococcus species NOT DETECTED NOT DETECTED Final   Staphylococcus aureus (BCID) NOT DETECTED NOT DETECTED Final   Staphylococcus epidermidis NOT DETECTED NOT DETECTED Final   Staphylococcus lugdunensis NOT DETECTED NOT DETECTED Final   Streptococcus species NOT DETECTED NOT DETECTED Final   Streptococcus agalactiae NOT DETECTED NOT DETECTED Final   Streptococcus pneumoniae NOT DETECTED NOT DETECTED Final   Streptococcus pyogenes NOT DETECTED NOT DETECTED Final   A.calcoaceticus-baumannii NOT DETECTED NOT DETECTED Final   Bacteroides fragilis NOT DETECTED NOT DETECTED Final   Enterobacterales DETECTED (A) NOT DETECTED Final    Comment: Enterobacterales represent a large order of gram negative bacteria, not a single organism. CRITICAL RESULT CALLED TO, READ BACK BY AND VERIFIED WITH: JASON ROBINS @ [id number] 05/29/21 LFD    Enterobacter cloacae  complex NOT DETECTED NOT DETECTED Final   Escherichia coli DETECTED (A) NOT DETECTED Final    Comment: CRITICAL RESULT CALLED TO, READ BACK BY AND VERIFIED WITH: JASON ROBINS @ 0981 05/29/21 LFD    Klebsiella aerogenes NOT DETECTED NOT DETECTED Final   Klebsiella oxytoca NOT DETECTED NOT DETECTED Final   Klebsiella pneumoniae NOT DETECTED NOT DETECTED Final   Proteus species NOT DETECTED NOT DETECTED Final   Salmonella species NOT DETECTED NOT DETECTED Final   Serratia marcescens NOT DETECTED NOT DETECTED Final   Haemophilus influenzae NOT  DETECTED NOT DETECTED Final   Neisseria meningitidis NOT DETECTED NOT DETECTED Final   Pseudomonas aeruginosa NOT DETECTED NOT DETECTED Final   Stenotrophomonas maltophilia NOT DETECTED NOT DETECTED Final   Candida albicans NOT DETECTED NOT DETECTED Final   Candida auris NOT DETECTED NOT DETECTED Final   Candida glabrata NOT DETECTED NOT DETECTED Final   Candida krusei NOT DETECTED NOT DETECTED Final   Candida parapsilosis NOT DETECTED NOT DETECTED Final   Candida tropicalis NOT DETECTED NOT DETECTED Final   Cryptococcus neoformans/gattii NOT DETECTED NOT DETECTED Final   CTX-M ESBL NOT DETECTED NOT DETECTED Final   Carbapenem resistance IMP NOT DETECTED NOT DETECTED Final   Carbapenem resistance KPC NOT DETECTED NOT DETECTED Final   Carbapenem resistance NDM NOT DETECTED NOT DETECTED Final   Carbapenem resist OXA 48 LIKE NOT DETECTED NOT DETECTED Final   Carbapenem resistance VIM NOT DETECTED NOT DETECTED Final    Comment: Performed at [facility], [address] [place], [place] [address]  Urine culture     Status: Abnormal   Collection Time: 05/18/2021  4:16 PM   Specimen: Urine, Random  Result Value Ref Range Status   Specimen Description   Final    URINE, RANDOM Performed at [facility], [address] [place], [place] [address]    Special Requests   Final    NONE Performed at [facility], [address] [place], [place] [address]    Culture 40,000 COLONIES/mL ESCHERICHIA COLI (A)  Final   Report Status 05/31/2021 FINAL  Final   Organism ID, Bacteria ESCHERICHIA COLI (A)  Final      Susceptibility   Escherichia coli - MIC*    AMPICILLIN <=2 SENSITIVE Sensitive     CEFAZOLIN <=4 SENSITIVE Sensitive     CEFEPIME <=0.12 SENSITIVE Sensitive     CEFTRIAXONE <=0.25 SENSITIVE Sensitive     CIPROFLOXACIN <=0.25 SENSITIVE Sensitive     GENTAMICIN <=1 SENSITIVE Sensitive     IMIPENEM <=0.25 SENSITIVE Sensitive     NITROFURANTOIN <=16 SENSITIVE  Sensitive     TRIMETH/SULFA <=20 SENSITIVE Sensitive     AMPICILLIN/SULBACTAM <=2 SENSITIVE Sensitive     PIP/TAZO <=4 SENSITIVE Sensitive     * 40,000 COLONIES/mL ESCHERICHIA COLI  Culture, Respiratory w Gram Stain     Status: None   Collection Time: 05/06/2021  4:47 PM   Specimen: INDUCED SPUTUM  Result Value Ref Range Status   Specimen Description   Final    INDUCED SPUTUM Performed at [facility], [address] [place], [place] [address]    Special Requests   Final    NONE Performed at [facility], [address] [place], [place] [address]    Gram Stain   Final    FEW WBC PRESENT,BOTH PMN AND MONONUCLEAR NO ORGANISMS SEEN    Culture   Final    RARE Normal respiratory flora-no Staph aureus or Pseudomonas seen Performed  at [facility], [place] [address], [place], [place] [address]    Report Status 05/31/2021 FINAL  Final  MRSA Next Gen by PCR, Nasal     Status: None   Collection Time: 05/06/2021  6:43 PM   Specimen: Nasal Mucosa; Nasal Swab  Result Value Ref Range Status   MRSA by PCR Next Gen NOT DETECTED NOT DETECTED Final    Comment: (NOTE) The GeneXpert MRSA Assay (FDA approved for NASAL specimens only), is one component of a comprehensive MRSA colonization surveillance program. It is not intended to diagnose MRSA infection nor to guide or monitor treatment for MRSA infections. Test performance is not FDA approved in patients less than 74 years old. Performed at [facility], [address] [place], [place] [address]     Lab Basic Metabolic Panel: Recent Labs  Lab 05/09/2021 1212 05/29/21 0353 05/29/21 1245 05/30/21 0504 05/31/21 0431 06-25-21 0401  NA 130* 136  --  144 145 144  K 3.6 3.1* 3.6 3.6 3.9 4.6  CL 95* 99  --  105 109 112*  CO2 21* 26  --  26 23 21*  GLUCOSE 207* 289*  --  217* 184* 180*  BUN 40* 48*  --  66* 88* 108*  CREATININE 1.82* 1.91*  --  2.01* 2.62* 4.18*  CALCIUM 8.8* 7.7*  --  7.5* 7.2* 6.7*  MG   --  1.9  --   --  2.5* 2.6*  PHOS  --  7.9*  --   --   --  7.3*   Liver Function Tests: Recent Labs  Lab 05/15/2021 1212  AST 137*  ALT 25  ALKPHOS 63  BILITOT 0.9  PROT 7.2  ALBUMIN 3.3*   No results for input(s): LIPASE, AMYLASE in the last 168 hours. No results for input(s): AMMONIA in the last 168 hours. CBC: Recent Labs  Lab 05/26/2021 1212 05/27/2021 2017 05/30/21 0504 05/31/21 0431 2021-06-25 0401  WBC 15.0*  --  19.0* 18.5* 21.8*  NEUTROABS 12.9*  --  16.3*  --   --   HGB 9.6* 8.9* 8.1* 7.8* 7.1*  HCT 28.2* 25.6* 24.5* 24.2* 22.5*  MCV 84.2  --  85.7 89.0 91.1  PLT 301  --  370 381 369   Cardiac Enzymes: No results for input(s): CKTOTAL, CKMB, CKMBINDEX, TROPONINI in the last 168 hours. Sepsis Labs: Recent Labs  Lab 05/27/2021 1212 05/13/2021 1350 05/29/21 0353 05/30/21 0504 05/31/21 0431 2021/06/25 0401  PROCALCITON 9.50  --  9.46 6.21  --   --   WBC 15.0*  --   --  19.0* 18.5* 21.8*  LATICACIDVEN  --  1.5  --   --   --   --    PATIENT PRONOUNCED DEAD at 11:55AM  Abbi Mancini Jun 25, 2021, 1:21 PM

## 2021-06-04 NOTE — Progress Notes (Signed)
NAME:  Barbara [name], MRN:  [id number], DOB:  August 26, 1941, LOS: 4 ADMISSION DATE:  05/25/2021  HISTORY OF PRESENT ILLNESS:  80 yo WF never smoker recently admitted for hypoxic respiratory failure in May, where she was found to have stigmata of interstitial lung disease and concurrent pulmonary edema, now presents with complaints of chest pain There was a concurrent cough productive of some purulent sputum over the past several days. CXR revealed bilateral interstitial edema. A troponin returned in excess of 16k, and cards saw her in the ED with a prelim echo reporting new reduction in EF 30-35%. She failed NIV  for accruing oxygen need, and right before intubation developed some hemoptysis (heparin was initiated for ACS but stopped). With intubation she required a levophed drip.     INITERVAL HISTORY Multiorgan failure Severe hypoxia Severe s CHF Severe ILD Progressive renal failure Significant Hospital Events: Including procedures, antibiotic start and stop dates in addition to other pertinent events    6/24 admitted to ICU 6/26 remains on vent 6/27 remains on vent 6/28 remains on vent, severe multiorgan failure   Interim History / Subjective:  Patient with active dying process Multiorgan failure        Objective   Blood pressure (!) 83/60, pulse (!) 142, temperature 100.04 F (37.8 C), temperature source Bladder, resp. rate (!) 30, height 5\' 1"  (1.549 m), weight 67.2 kg, SpO2 100 %. CVP:  [10 mmHg-18 mmHg] 11 mmHg  Vent Mode: PRVC FiO2 (%):  [50 %-70 %] 70 % Set Rate:  [26 bmp] 26 bmp Vt Set:  [320 mL] 320 mL PEEP:  [5 cmH20-10 cmH20] 10 cmH20 Plateau Pressure:  [24 cmH20-26 cmH20] 26 cmH20   Intake/Output Summary (Last 24 hours) at 2021-06-05 1937 Last data filed at 06/05/21 0600 Gross per 24 hour  Intake 3198.97 ml  Output 60 ml  Net 3138.97 ml   Filed Weights   05/29/21 0413 05/31/21 0415 06-05-21 0500  Weight: 58.9 kg 59.1 kg 67.2 kg      REVIEW OF  SYSTEMS  PATIENT IS UNABLE TO PROVIDE COMPLETE REVIEW OF SYSTEMS DUE TO SEVERE CRITICAL ILLNESS AND TOXIC METABOLIC ENCEPHALOPATHY   PHYSICAL EXAMINATION:  GENERAL:critically ill appearing, +resp distress HEAD: Normocephalic, atraumatic.  EYES: Pupils equal, round, reactive to light.  No scleral icterus.  MOUTH: Moist mucosal membrane. NECK: Supple. PULMONARY: +rhonchi, +wheezing CARDIOVASCULAR: S1 and S2. Regular rate and rhythm. No murmurs, rubs, or gallops.  GASTROINTESTINAL: Soft, nontender, -distended. Positive bowel sounds.  MUSCULOSKELETAL: No swelling, clubbing, or edema.  NEUROLOGIC: obtunded SKIN:intact,warm,dry     ASSESSMENT AND PLAN SYNOPSIS  Severe ACUTE Hypoxic and Hypercapnic Respiratory Failure: ILD, acute exacerbation and pneumonia Hemoptysis/bronchiectasis    Severe ACUTE Hypoxic and Hypercapnic Respiratory Failure -continue Mechanical Ventilator support -continue Bronchodilator Therapy -Wean Fio2 and PEEP as tolerated -VAP/VENT bundle implementation Vent Mode: PRVC FiO2 (%):  [50 %-70 %] 70 % Set Rate:  [26 bmp] 26 bmp Vt Set:  [320 mL] 320 mL PEEP:  [5 cmH20-10 cmH20] 10 cmH20 Plateau Pressure:  [24 cmH20-26 cmH20] 26 cmH20     CARDIAC FAILURE-acute  systolic dysfunction EF 90% -oxygen as needed   CARDIAC ICU monitoring   ACUTE KIDNEY INJURY/Renal Failure progressive failure  -continue Foley Catheter-assess need -Avoid nephrotoxic agents -Follow urine output, BMP -Ensure adequate renal perfusion, optimize oxygenation -Renal dose medications Family does NOT want HD   NEUROLOGY Acute toxic metabolic encephalopathy, need for sedation Goal RASS -2 to -3   SEPTIC/Cardiogenic SHOCK -use vasopressors to  keep MAP>65 as needed -follow ABG and LA -follow up cultures -emperic ABX -consider stress dose steroids  INFECTIOUS DISEASE -continue antibiotics as prescribed -follow up cultures  ENDO - ICU hypoglycemic\Hyperglycemia  protocol -check FSBS per protocol   GI GI PROPHYLAXIS as indicated  NUTRITIONAL STATUS DIET-->TF's as tolerated Constipation protocol as indicated   ELECTROLYTES -follow labs as needed -replace as needed -pharmacy consultation and following     Best practice (right click and "Reselect all SmartList Selections" daily)  Diet:  NPO Pain/Anxiety/Delirium protocol (if indicated): Yes (RASS goal -2) VAP protocol (if indicated): Yes DVT prophylaxis: Subcutaneous Heparin GI prophylaxis: PPI Glucose control:  SSI Yes Central venous access:  Yes, and it is still needed Arterial line:  Yes, and it is still needed Foley:  Yes, and it is still needed Mobility:  bed rest  PT consulted: N/A Code Status:  DNR Disposition: ICU  Labs   CBC: Recent Labs  Lab 05/11/2021 1212 05/31/2021 2017 05/30/21 0504 05/31/21 0431 06-14-2021 0401  WBC 15.0*  --  19.0* 18.5* 21.8*  NEUTROABS 12.9*  --  16.3*  --   --   HGB 9.6* 8.9* 8.1* 7.8* 7.1*  HCT 28.2* 25.6* 24.5* 24.2* 22.5*  MCV 84.2  --  85.7 89.0 91.1  PLT 301  --  370 381 409    Basic Metabolic Panel: Recent Labs  Lab 05/26/2021 1212 05/29/21 0353 05/29/21 1245 05/30/21 0504 05/31/21 0431 2021/06/14 0401  NA 130* 136  --  144 145 144  K 3.6 3.1* 3.6 3.6 3.9 4.6  CL 95* 99  --  105 109 112*  CO2 21* 26  --  26 23 21*  GLUCOSE 207* 289*  --  217* 184* 180*  BUN 40* 48*  --  66* 88* 108*  CREATININE 1.82* 1.91*  --  2.01* 2.62* 4.18*  CALCIUM 8.8* 7.7*  --  7.5* 7.2* 6.7*  MG  --  1.9  --   --  2.5* 2.6*  PHOS  --  7.9*  --   --   --  7.3*   GFR: Estimated Creatinine Clearance: 9.6 mL/min (A) (by C-G formula based on SCr of 4.18 mg/dL (H)). Recent Labs  Lab 05/27/2021 1212 05/23/2021 1350 05/29/21 0353 05/30/21 0504 05/31/21 0431 06/14/2021 0401  PROCALCITON 9.50  --  9.46 6.21  --   --   WBC 15.0*  --   --  19.0* 18.5* 21.8*  LATICACIDVEN  --  1.5  --   --   --   --     Liver Function Tests: Recent Labs  Lab  05/19/2021 1212  AST 137*  ALT 25  ALKPHOS 63  BILITOT 0.9  PROT 7.2  ALBUMIN 3.3*   No results for input(s): LIPASE, AMYLASE in the last 168 hours. No results for input(s): AMMONIA in the last 168 hours.  ABG    Component Value Date/Time   PHART 7.08 (LL) 06-14-2021 0500   PCO2ART 59 (H) 06/14/2021 0500   PO2ART 159 (H) 06-14-2021 0500   HCO3 17.5 (L) 2021/06/14 0500   ACIDBASEDEF 12.0 (H) 2021/06/14 0500   O2SAT PENDING 2021-06-14 0500   O2SAT 98.7 06-14-2021 0500     Coagulation Profile: Recent Labs  Lab 05/12/2021 1211  INR 1.4*    Cardiac Enzymes: No results for input(s): CKTOTAL, CKMB, CKMBINDEX, TROPONINI in the last 168 hours.  HbA1C: Hgb A1c MFr Bld  Date/Time Value Ref Range Status  05/05/2021 05:55 AM 5.5 4.8 - 5.6 % Final  Comment:    (NOTE)         Prediabetes: 5.7 - 6.4         Diabetes: >6.4         Glycemic control for adults with diabetes: <7.0   12/01/2020 09:53 AM 5.4 4.6 - 6.5 % Final    Comment:    Glycemic Control Guidelines for People with Diabetes:Non Diabetic:  <6%Goal of Therapy: <7%Additional Action Suggested:  >8%     CBG: Recent Labs  Lab 05/31/21 1126 05/31/21 1621 05/31/21 1940 05/31/21 2336 06-05-21 0434  GLUCAP 138* 153* 147* 174* 160*    Allergies Allergies  Allergen Reactions   Atorvastatin     REACTION: increased liver fxn tests   Ezetimibe     REACTION: stomach upset/malaise   Sulfonamide Derivatives     REACTION: rash       DVT/GI PRX  assessed I Assessed the need for Labs I Assessed the need for Foley I Assessed the need for Central Venous Line Family Discussion when available I Assessed the need for Mobilization I made an Assessment of medications to be adjusted accordingly Safety Risk assessment completed  CASE DISCUSSED IN MULTIDISCIPLINARY ROUNDS WITH ICU TEAM     Critical Care Time devoted to patient care services described in this note is 55 minutes.  Critical care was necessary to treat  or prevent imminent or life-threatening deterioration. Overall, patient is critically ill, prognosis is guarded.  Patient with Multiorgan failure and at high risk for cardiac arrest and death.    Corrin Parker, M.D.  [facility]  Medical Director [place] Director [facility] Cardio-Pulmonary Department

## 2021-06-04 NOTE — Progress Notes (Signed)
Spoke with CDS coordinator Vira Agar. States that because patient still has some minor reflexes and due to age and PMH, is not a candidate for donation. Should patient lose reflexes and family/medical team decide to pursue brain death testing call back. Otherwise, call back with cardiac TOD.

## 2021-06-04 NOTE — Progress Notes (Signed)
Pt. Extubated to room air. 

## 2021-06-04 NOTE — Progress Notes (Signed)
Attempted to reach both sisters X3 to let them know that patient's BP is dropping and HR has started to drop a little bit and that they should call any family that may want to see patient to come to the bedside. Unable to reach either sister.

## 2021-06-04 NOTE — Progress Notes (Signed)
Both sisters and best friend are at bedside. State that no one else will be coming and that they plan on withdrawing care today. MD at bedside speaking with family.

## 2021-06-04 NOTE — Progress Notes (Signed)
Patient's rhythm changed to afib with RVR with rate 150s-160s. MD notified, no new orders at this time. Attempted again to reach both sisters without success.

## 2021-06-04 NOTE — Progress Notes (Signed)
Pt extubated to room air with family at bedside. Chaplain present providing support to sisters and best friend. Patient expired at 1155. This RN and Verdis Frederickson, RN verified. MD notified of TOD.

## 2021-06-04 NOTE — Progress Notes (Signed)
Per Dr. Mortimer Fries, once current bag of neo runs out, do not hang new bag of neo. Per MD, stop neo and go up on levo infusion. Current maxiumum dosage of levo 51mcg/min, per Dr. Mortimer Fries, can go up to 100 mcg/min of levo. Order modified.

## 2021-06-04 NOTE — Progress Notes (Signed)
Progress Note  Patient Name: Barbara [name] Date of Encounter: 06-04-21  Primary Cardiologist: [facility] - consult by End  Subjective   She remains intubated, sedated, and continues to vasopressor support with norepinephrine, phenylephrine, and vasopressin. Milrinone was started 6/27 for inotropic support, though had to be stopped with significant hypotension and worsening renal function. Renal function worse with a BUN/SCr trend of 88/2.62 to 108/4.18 over the past 24 hours. HGB down trending to 7.1 this morning. BP remains soft in the 60s to 34V mmHg systolic. Family has not consented to HD to date. She remains tachycardic, possibly in atrial tachycardia vs SVT with rates of 144 bpm.   Inpatient Medications    Scheduled Meds:  sodium chloride   Intravenous Once   aspirin  324 mg Oral Once   chlorhexidine gluconate (MEDLINE KIT)  15 mL Mouth Rinse BID   Chlorhexidine Gluconate Cloth  6 each Topical Daily   hydrocortisone sod succinate (SOLU-CORTEF) inj  50 mg Intravenous Q6H   insulin aspart  0-9 Units Subcutaneous Q4H   levalbuterol  0.63 mg Nebulization Q6H   mouth rinse  15 mL Mouth Rinse 10 times per day   pantoprazole (PROTONIX) IV  40 mg Intravenous QHS   sodium chloride flush  10-40 mL Intracatheter Q12H   Continuous Infusions:  sodium chloride Stopped (06-04-21 0735)   ampicillin-sulbactam (UNASYN) IV 200 mL/hr at 2021/06/04 0745   milrinone Stopped (05/31/21 2218)   norepinephrine (LEVOPHED) Adult infusion 60 mcg/min (2021/06/04 0814)   phenylephrine (NEO-SYNEPHRINE) Adult infusion 400 mcg/min (June 04, 2021 0745)   propofol (DIPRIVAN) infusion 20 mcg/kg/min (04-Jun-2021 0745)    sodium bicarbonate (isotonic) infusion in sterile water 125 mL/hr at Jun 04, 2021 0745   vasopressin 0.03 Units/min (June 04, 2021 0745)   PRN Meds: sodium chloride, acetaminophen, docusate sodium, fentaNYL (SUBLIMAZE) injection, midazolam, polyethylene glycol, sodium chloride flush, vecuronium    Vital Signs    Vitals:   06/04/21 0730 2021-06-04 0745 June 04, 2021 0800 2021-06-04 0815  BP: (!) 79/53 (!) 72/54 (!) 74/54   Pulse: (!) 141 (!) 140 (!) 139 (!) 140  Resp: (!) 30 (!) 30 (!) 30 (!) 30  Temp: 100.22 F (37.9 C) 100.22 F (37.9 C) 100.04 F (37.8 C) 100.04 F (37.8 C)  TempSrc: Bladder     SpO2: 95% 96% 96% 96%  Weight:      Height:        Intake/Output Summary (Last 24 hours) at 06/04/21 0826 Last data filed at 06-04-21 0745 Gross per 24 hour  Intake 3656.1 ml  Output 60 ml  Net 3596.1 ml    Filed Weights   05/29/21 0413 05/31/21 0415 Jun 04, 2021 0500  Weight: 58.9 kg 59.1 kg 67.2 kg    Telemetry    Atrial tach vs SVT with rate of 144 bpm - Personally Reviewed  ECG    No new tracings - Personally Reviewed  Physical Exam   GEN: Critically ill appearing.  Neck: JVD unable to be assess secondary to respiratory support status. Cardiac: Tachycardic, no murmurs, rubs, or gallops.  Respiratory: Diminished and vented breath sounds bilaterally.  GI: Soft, nontender, non-distended.   MS: No edema; No deformity. Neuro:  Intubated and sedated. Psych: Intubated and sedated.   Labs    Chemistry Recent Labs  Lab 05/08/2021 1212 05/29/21 0353 05/30/21 0504 05/31/21 0431 06/04/2021 0401  NA 130*   < > 144 145 144  K 3.6   < > 3.6 3.9 4.6  CL 95*   < > 105  109 112*  CO2 21*   < > 26 23 21*  GLUCOSE 207*   < > 217* 184* 180*  BUN 40*   < > 66* 88* 108*  CREATININE 1.82*   < > 2.01* 2.62* 4.18*  CALCIUM 8.8*   < > 7.5* 7.2* 6.7*  PROT 7.2  --   --   --   --   ALBUMIN 3.3*  --   --   --   --   AST 137*  --   --   --   --   ALT 25  --   --   --   --   ALKPHOS 63  --   --   --   --   BILITOT 0.9  --   --   --   --   GFRNONAA 28*   < > 25* 18* 10*  ANIONGAP 14   < > _0 < > = values in this interval not displayed.      Hematology Recent Labs  Lab 05/30/21 0504 05/31/21 0431 06/27/2021 0401  WBC 19.0* 18.5* 21.8*  RBC 2.86* 2.72* 2.47*  HGB  8.1* 7.8* 7.1*  HCT 24.5* 24.2* 22.5*  MCV 85.7 89.0 91.1  MCH 28.3 28.7 28.7  MCHC 33.1 32.2 31.6  RDW 14.7 15.0 15.3  PLT 370 381 369     Cardiac EnzymesNo results for input(s): TROPONINI in the last 168 hours. No results for input(s): TROPIPOC in the last 168 hours.   BNP Recent Labs  Lab 05/23/2021 1212  BNP 1,699.7*      DDimer No results for input(s): DDIMER in the last 168 hours.   Radiology    DG Abd Portable 1V  Result Date: 05/29/2021 IMPRESSION: NG tube placement as above. The side port and distal tip are in the region of the stomach, below the GE junction. Electronically Signed   By: Dorise Bullion III M.D   On: 05/29/2021 10:42    Cardiac Studies   2D echo 05/05/2021:  1. Left ventricular ejection fraction, by estimation, is 55 to 60%. The  left ventricle has normal function. The left ventricle has no regional  wall motion abnormalities. Left ventricular diastolic parameters were  normal.   2. Right ventricular systolic function is normal. The right ventricular  size is normal.   3. The mitral valve is normal in structure. No evidence of mitral valve  regurgitation.   4. The aortic valve is grossly normal. Aortic valve regurgitation is not  visualized.  __________  Limited echo 05/27/2021: 1. Left ventricular ejection fraction, by estimation, is 30 to 35%. The  left ventricle has moderately decreased function. The left ventricle  demonstrates regional wall motion abnormalities (see scoring  diagram/findings for description). There is mild  left ventricular hypertrophy. There is severe hypokinesis of the left  ventricular, mid-apical anterior wall, anterolateral wall, anteroseptal  wall, apical segment and inferior wall. Findings are most consistent with  large LAD territory artifact, though  stress-induced cardiomyopathy could also have this appearance.   2. Left atrial size was mildly dilated.   3. The mitral valve is normal in structure. Mild mitral  valve  regurgitation.   4. Tricuspid valve regurgitation is mild to moderate.   5. The aortic valve is tricuspid. There is mild thickening of the aortic  valve.   Comparison(s): LVEF has decreased significantly since 05/05/2021.   Patient Profile     80 y.o. female with history of  ILD,  DM2, HTN, HLD, hypothyroidism, anxiety, and cognitive impairment who is being seen today for the evaluation of NSTEMI complicated by cardiogenic shock with acute HFrEF   Assessment & Plan    1. Acute systolic CHF secondary to presumed ICM: -Hypotensive requiring multi pressor support as outlined above -Did not tolerate milrinone due to hypotension and worsening renal failure -Unable to add GDMT in this setting -Supportive care  2. NSTEMI: -Initial and peak troponin 16387 -Heparin gtt stopped due to hemoptysis and down trending HGB -Not currently a cath candidate given acutely ill with multiorgan failure and anemia -LHC if clinical status improves -No ASA with hemoptysis and down trending HGB  3. Acute on chronic hypoxic respiratory failure with ILD: -Intubated and sedated -Per CCM  4. Multiorgan failure: -Overall, very poor prognosis, actively dying  -Consider Palliative care consult  5. Narrow complex tachycardia: -Appears to be atrial tachycardia vs SVT -Could push adenosine to see if this will break, unlikely to change her clinical course -Emergent DCCV unlikely to significantly change prognosis    For questions or updates, please contact [facility] Please consult [email or web address] for contact info under Cardiology/STEMI.    Signed, Christell Faith, PA-C [place] Pager: [phone] June 09, 2021, 8:26 AM

## 2021-06-04 NOTE — Telephone Encounter (Signed)
Mrs. Barbara [name] called in and wanted to tell Dr. Glori Bickers that Nakayla passed Tuesday and she is getting buried on Saturday.

## 2021-06-04 DEATH — deceased

## 2021-06-08 LAB — COOXEMETRY PANEL
Carboxyhemoglobin: 1.5 % (ref 0.5–1.5)
Methemoglobin: 0.1 % (ref 0.0–1.5)

## 2021-06-09 DIAGNOSIS — J9601 Acute respiratory failure with hypoxia: Secondary | ICD-10-CM | POA: Diagnosis not present

## 2021-06-20 IMAGING — CT CT CHEST HIGH RESOLUTION W/O CM
2 of 5 series · 14 of 36 positions shown, 17 images · non-contrast
Comparison: Chest radiograph from earlier today.

CLINICAL DATA: Abnormal chest radiograph, dyspnea

EXAM:
CT CHEST WITHOUT CONTRAST
TECHNIQUE: Multidetector CT imaging of the chest was performed following the
standard protocol without intravenous contrast. High resolution
imaging of the lungs, as well as inspiratory and expiratory imaging,
was performed.

[Series 2: thorax · axial · 0.62mm/px · z∈[-12,+250]mm · 11 of 152 slices shown, 14 images]
[im 14/152  mediastinal]
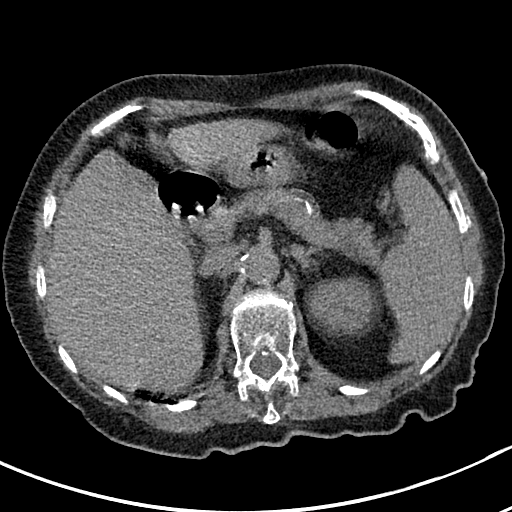
[im 14/152  lung]
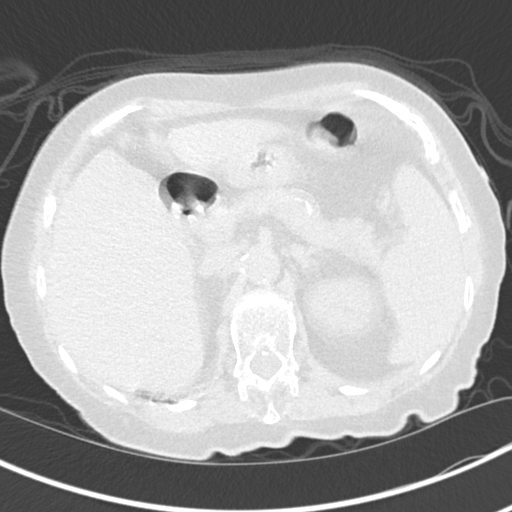
[im 27/152  lung]
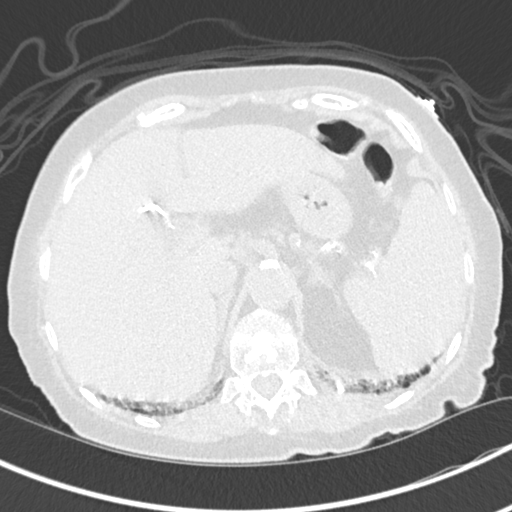
[im 40/152  lung]
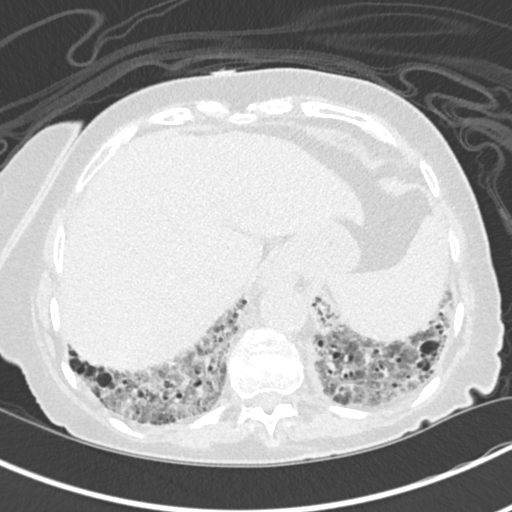
[im 53/152  lung]
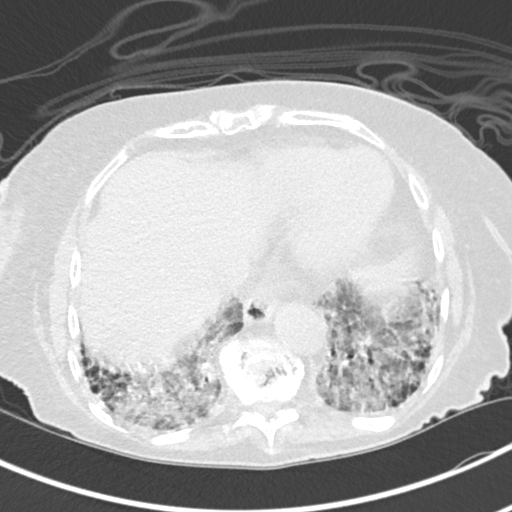
[im 66/152  mediastinal]
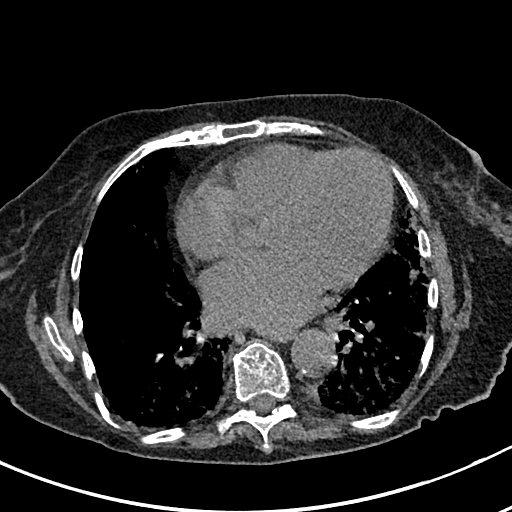
[im 66/152  lung]
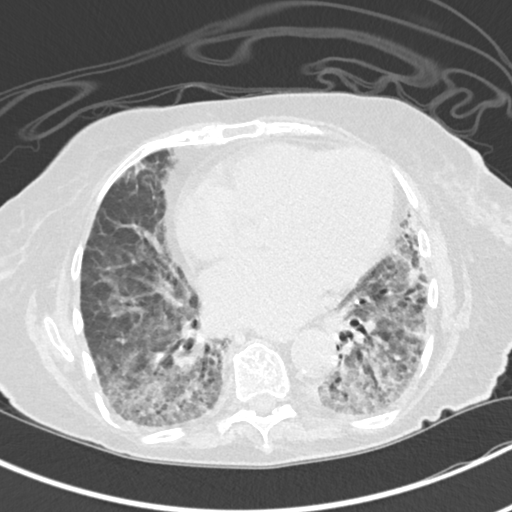
[im 79/152  lung]
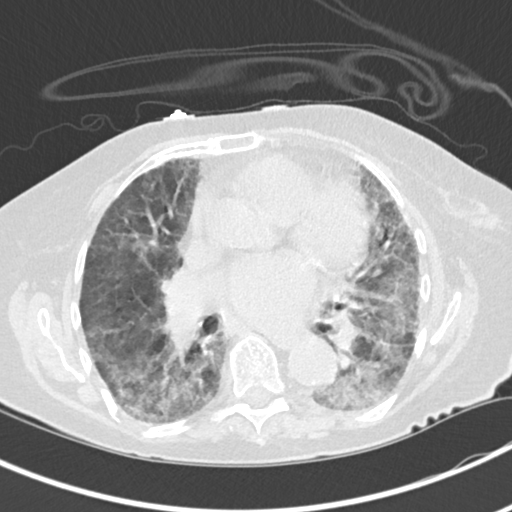
[im 92/152  lung]
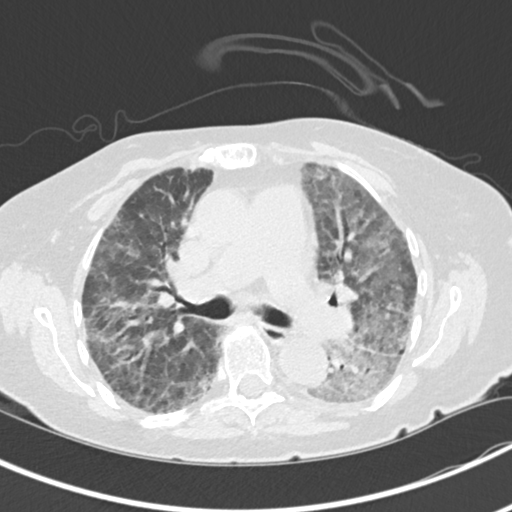
[im 106/152  lung]
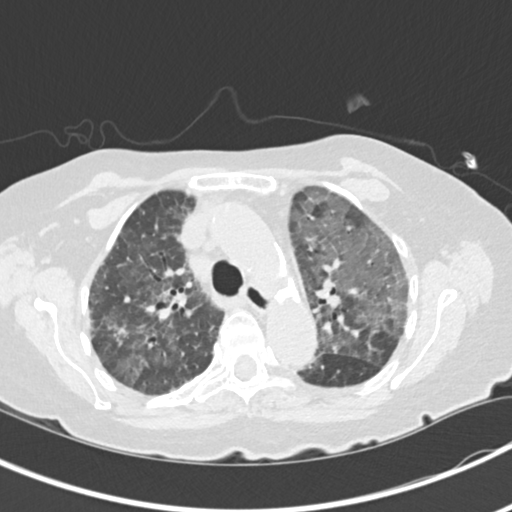
[im 119/152  mediastinal]
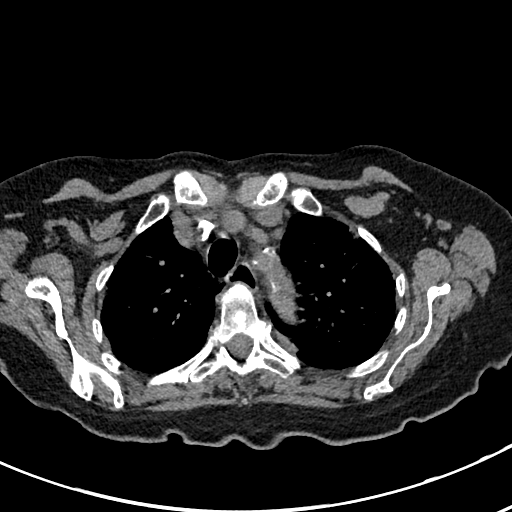
[im 119/152  lung]
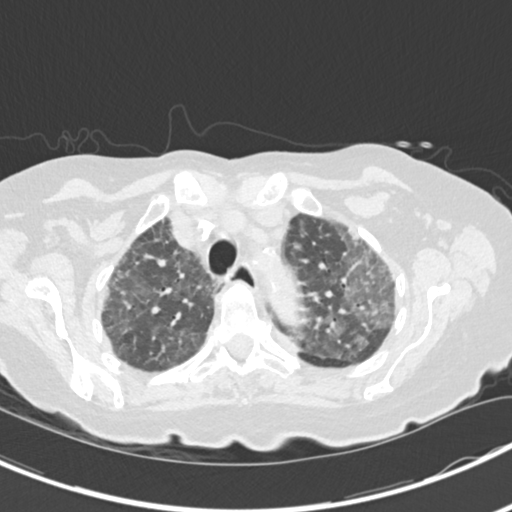
[im 132/152  lung]
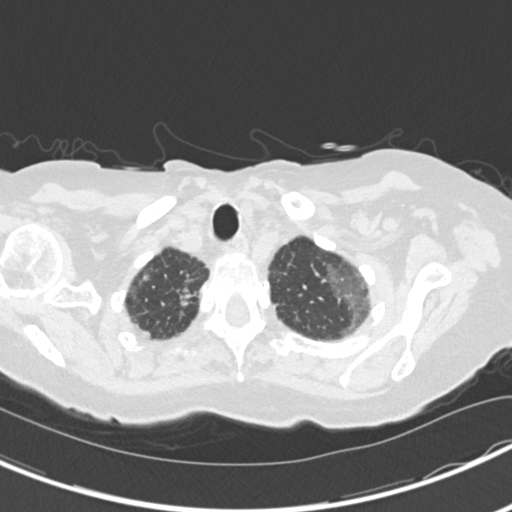
[im 145/152  lung]
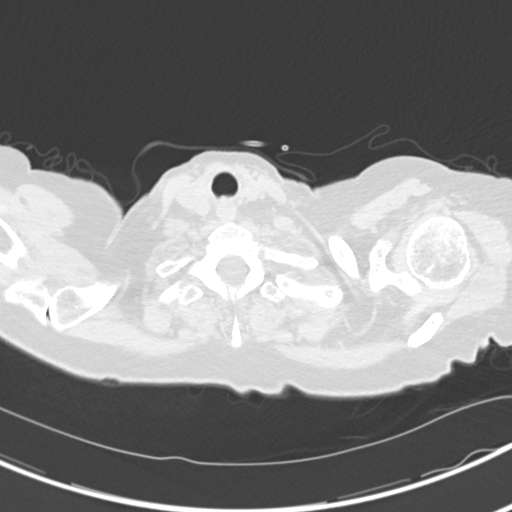

[Series 5: coronal · coronal · 0.61mm/px · 3 of 115 slices shown]
[im 23/115  lung]
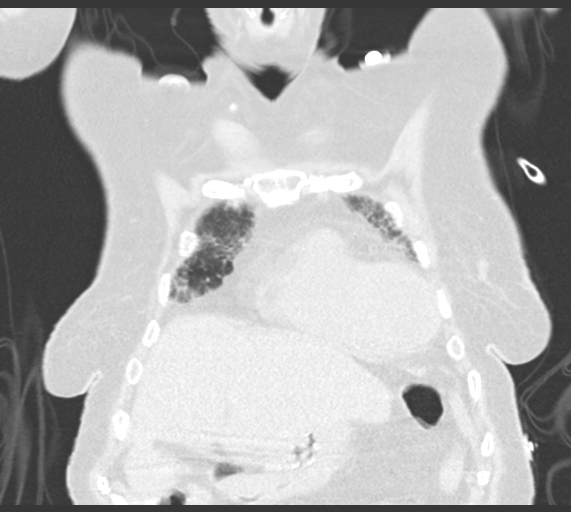
[im 46/115  lung]
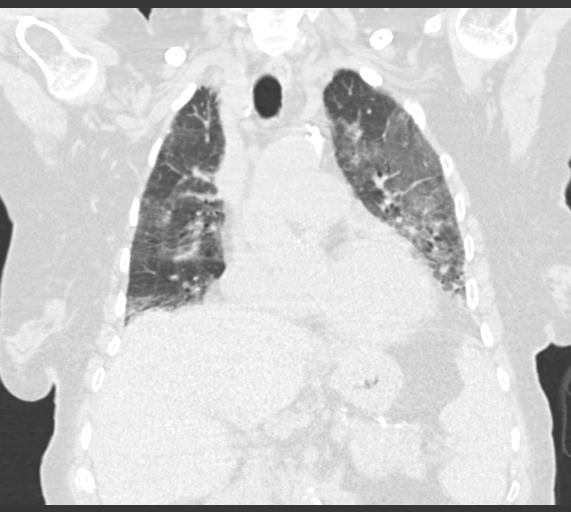
[im 69/115  lung]
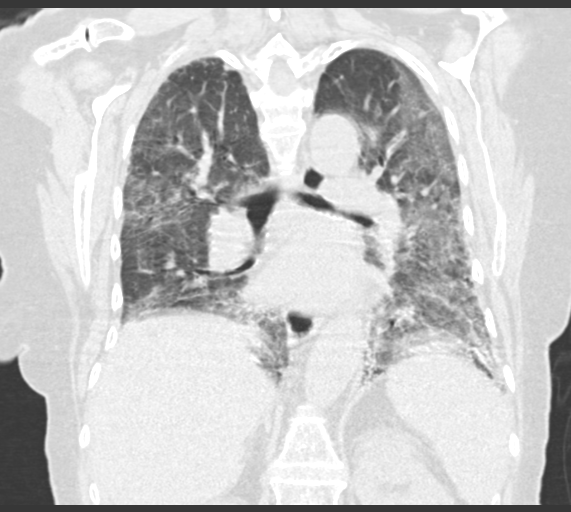

[14 of 36 positions shown; findings below may reference images not displayed]

FINDINGS: Motion degraded scan, limiting assessment.

Cardiovascular: Mild cardiomegaly. No significant pericardial
effusion/thickening. Three-vessel coronary atherosclerosis.
Atherosclerotic nonaneurysmal thoracic aorta. Dilated main pulmonary
artery (3.8 cm diameter).

Mediastinum/Nodes: No discrete thyroid nodules. Unremarkable
esophagus. No axillary adenopathy. Mildly enlarged 1.5 cm right
paratracheal node (series 2/image 50). Mildly enlarged 1.3 cm
subcarinal node (series 2/image 63). Mildly enlarged 1.0 cm left
prevascular node (series 2/image 51). No discrete hilar adenopathy
on these noncontrast images.

Lungs/Pleura: No pneumothorax. No pleural effusion. No acute
consolidative airspace disease, lung masses or significant pulmonary
nodules. Subcentimeter calcified basilar left lower lobe granuloma.
Widespread patchy ground-glass opacities throughout both lungs.
Patchy subpleural reticulation and mild interlobular septal
thickening with associated mild traction bronchiectasis and
architectural distortion, most prominent in the lower lungs.
Scattered mild honeycombing in the dependent lower lobes
bilaterally, right greater than left, poorly evaluated due to motion
degradation. No significant lobular air trapping or evidence of
tracheobronchomalacia on the expiration sequence.

Upper abdomen: Small hiatal hernia.  Cholecystectomy.

Musculoskeletal: No aggressive appearing focal osseous lesions. Mild
T4 vertebral compression fracture and severe T10 vertebral
compression fracture of indeterminate chronicity, chronic appearing.
Mild thoracic spondylosis.
IMPRESSION: 1. Motion degraded scan, limiting assessment.
2. Mild cardiomegaly.
3. Patchy ground-glass opacity throughout both lungs. Mild traction
bronchiectasis, architectural distortion and mild honeycombing,
basilar predominant. Findings are suggestive of cardiogenic
pulmonary edema superimposed on chronic basilar predominant fibrotic
interstitial lung disease, potentially underlying UIP. Suggest
attention on follow-up high-resolution chest CT study in 3-6 months.
4. Three-vessel coronary atherosclerosis.
5. Dilated main pulmonary artery, suggesting pulmonary arterial
hypertension.
6. Mild mediastinal lymphadenopathy, nonspecific, probably reactive.
7. Small hiatal hernia.
8. Chronic appearing T4 and T10 vertebral compression fractures.
9. Aortic Atherosclerosis ([id number]).
